# Patient Record
Sex: Male | Born: 1935 | Race: White | Hispanic: No | Marital: Single | Smoking: Never smoker
Health system: Southern US, Community
[De-identification: ages and names within clinical notes are randomized; demographics above are authoritative.]

## PROBLEM LIST (undated history)

## (undated) DIAGNOSIS — F03A0 Unspecified dementia, mild, without behavioral disturbance, psychotic disturbance, mood disturbance, and anxiety: Secondary | ICD-10-CM

## (undated) DIAGNOSIS — I10 Essential (primary) hypertension: Secondary | ICD-10-CM

## (undated) DIAGNOSIS — F0390 Unspecified dementia without behavioral disturbance: Secondary | ICD-10-CM

---

## 2005-10-03 ENCOUNTER — Emergency Department (HOSPITAL_COMMUNITY): Admission: AC | Admit: 2005-10-03 | Discharge: 2005-10-04 | Payer: Self-pay

## 2019-04-16 ENCOUNTER — Emergency Department (HOSPITAL_COMMUNITY): Payer: No Typology Code available for payment source

## 2019-04-16 ENCOUNTER — Inpatient Hospital Stay (HOSPITAL_COMMUNITY)
Admission: EM | Admit: 2019-04-16 | Discharge: 2019-07-02 | DRG: 062 | Disposition: A | Discharge status: Skilled Nursing Facility | Payer: No Typology Code available for payment source | Attending: Neurology | Admitting: Neurology

## 2019-04-16 ENCOUNTER — Inpatient Hospital Stay (HOSPITAL_COMMUNITY): Payer: No Typology Code available for payment source

## 2019-04-16 DIAGNOSIS — R531 Weakness: Secondary | ICD-10-CM | POA: Diagnosis not present

## 2019-04-16 DIAGNOSIS — R414 Neurologic neglect syndrome: Secondary | ICD-10-CM | POA: Diagnosis present

## 2019-04-16 DIAGNOSIS — G301 Alzheimer's disease with late onset: Secondary | ICD-10-CM | POA: Diagnosis not present

## 2019-04-16 DIAGNOSIS — R1310 Dysphagia, unspecified: Secondary | ICD-10-CM | POA: Diagnosis present

## 2019-04-16 DIAGNOSIS — F0390 Unspecified dementia without behavioral disturbance: Secondary | ICD-10-CM | POA: Diagnosis present

## 2019-04-16 DIAGNOSIS — I6300 Cerebral infarction due to thrombosis of unspecified precerebral artery: Secondary | ICD-10-CM | POA: Diagnosis not present

## 2019-04-16 DIAGNOSIS — R1312 Dysphagia, oropharyngeal phase: Secondary | ICD-10-CM

## 2019-04-16 DIAGNOSIS — R0682 Tachypnea, not elsewhere classified: Secondary | ICD-10-CM | POA: Diagnosis not present

## 2019-04-16 DIAGNOSIS — E785 Hyperlipidemia, unspecified: Secondary | ICD-10-CM | POA: Diagnosis present

## 2019-04-16 DIAGNOSIS — R29716 NIHSS score 16: Secondary | ICD-10-CM | POA: Diagnosis present

## 2019-04-16 DIAGNOSIS — R471 Dysarthria and anarthria: Secondary | ICD-10-CM | POA: Diagnosis present

## 2019-04-16 DIAGNOSIS — S8001XA Contusion of right knee, initial encounter: Secondary | ICD-10-CM | POA: Diagnosis present

## 2019-04-16 DIAGNOSIS — N179 Acute kidney failure, unspecified: Secondary | ICD-10-CM | POA: Diagnosis present

## 2019-04-16 DIAGNOSIS — E876 Hypokalemia: Secondary | ICD-10-CM | POA: Diagnosis not present

## 2019-04-16 DIAGNOSIS — I129 Hypertensive chronic kidney disease with stage 1 through stage 4 chronic kidney disease, or unspecified chronic kidney disease: Secondary | ICD-10-CM | POA: Diagnosis present

## 2019-04-16 DIAGNOSIS — R29810 Facial weakness: Secondary | ICD-10-CM | POA: Diagnosis present

## 2019-04-16 DIAGNOSIS — E875 Hyperkalemia: Secondary | ICD-10-CM | POA: Diagnosis not present

## 2019-04-16 DIAGNOSIS — W19XXXA Unspecified fall, initial encounter: Secondary | ICD-10-CM | POA: Diagnosis present

## 2019-04-16 DIAGNOSIS — I639 Cerebral infarction, unspecified: Secondary | ICD-10-CM | POA: Diagnosis present

## 2019-04-16 DIAGNOSIS — Z7189 Other specified counseling: Secondary | ICD-10-CM

## 2019-04-16 DIAGNOSIS — E871 Hypo-osmolality and hyponatremia: Secondary | ICD-10-CM | POA: Diagnosis not present

## 2019-04-16 DIAGNOSIS — I63522 Cerebral infarction due to unspecified occlusion or stenosis of left anterior cerebral artery: Principal | ICD-10-CM | POA: Diagnosis present

## 2019-04-16 DIAGNOSIS — F0281 Dementia in other diseases classified elsewhere with behavioral disturbance: Secondary | ICD-10-CM | POA: Diagnosis not present

## 2019-04-16 DIAGNOSIS — R4781 Slurred speech: Secondary | ICD-10-CM | POA: Diagnosis present

## 2019-04-16 DIAGNOSIS — Z751 Person awaiting admission to adequate facility elsewhere: Secondary | ICD-10-CM | POA: Diagnosis not present

## 2019-04-16 DIAGNOSIS — D72829 Elevated white blood cell count, unspecified: Secondary | ICD-10-CM

## 2019-04-16 DIAGNOSIS — Z20822 Contact with and (suspected) exposure to covid-19: Secondary | ICD-10-CM | POA: Diagnosis present

## 2019-04-16 DIAGNOSIS — K5900 Constipation, unspecified: Secondary | ICD-10-CM | POA: Diagnosis present

## 2019-04-16 DIAGNOSIS — E782 Mixed hyperlipidemia: Secondary | ICD-10-CM | POA: Diagnosis not present

## 2019-04-16 DIAGNOSIS — I361 Nonrheumatic tricuspid (valve) insufficiency: Secondary | ICD-10-CM | POA: Diagnosis not present

## 2019-04-16 DIAGNOSIS — Z608 Other problems related to social environment: Secondary | ICD-10-CM | POA: Diagnosis present

## 2019-04-16 DIAGNOSIS — E7800 Pure hypercholesterolemia, unspecified: Secondary | ICD-10-CM | POA: Diagnosis not present

## 2019-04-16 DIAGNOSIS — I10 Essential (primary) hypertension: Secondary | ICD-10-CM | POA: Diagnosis not present

## 2019-04-16 DIAGNOSIS — Z1389 Encounter for screening for other disorder: Secondary | ICD-10-CM

## 2019-04-16 DIAGNOSIS — Z609 Problem related to social environment, unspecified: Secondary | ICD-10-CM | POA: Diagnosis not present

## 2019-04-16 DIAGNOSIS — G8191 Hemiplegia, unspecified affecting right dominant side: Secondary | ICD-10-CM | POA: Diagnosis present

## 2019-04-16 DIAGNOSIS — N1831 Chronic kidney disease, stage 3a: Secondary | ICD-10-CM | POA: Diagnosis not present

## 2019-04-16 DIAGNOSIS — Z658 Other specified problems related to psychosocial circumstances: Secondary | ICD-10-CM | POA: Diagnosis not present

## 2019-04-16 DIAGNOSIS — Z0189 Encounter for other specified special examinations: Secondary | ICD-10-CM

## 2019-04-16 DIAGNOSIS — K117 Disturbances of salivary secretion: Secondary | ICD-10-CM

## 2019-04-16 DIAGNOSIS — I63422 Cerebral infarction due to embolism of left anterior cerebral artery: Secondary | ICD-10-CM | POA: Diagnosis not present

## 2019-04-16 DIAGNOSIS — M6281 Muscle weakness (generalized): Secondary | ICD-10-CM | POA: Diagnosis present

## 2019-04-16 DIAGNOSIS — R4701 Aphasia: Secondary | ICD-10-CM

## 2019-04-16 DIAGNOSIS — R000 Tachycardia, unspecified: Secondary | ICD-10-CM | POA: Diagnosis not present

## 2019-04-16 DIAGNOSIS — R509 Fever, unspecified: Secondary | ICD-10-CM

## 2019-04-16 DIAGNOSIS — Z515 Encounter for palliative care: Secondary | ICD-10-CM

## 2019-04-16 DIAGNOSIS — I340 Nonrheumatic mitral (valve) insufficiency: Secondary | ICD-10-CM | POA: Diagnosis not present

## 2019-04-16 DIAGNOSIS — R479 Unspecified speech disturbances: Secondary | ICD-10-CM | POA: Diagnosis not present

## 2019-04-16 DIAGNOSIS — F0280 Dementia in other diseases classified elsewhere without behavioral disturbance: Secondary | ICD-10-CM | POA: Diagnosis not present

## 2019-04-16 HISTORY — DX: Unspecified dementia, mild, without behavioral disturbance, psychotic disturbance, mood disturbance, and anxiety: F03.A0

## 2019-04-16 HISTORY — DX: Essential (primary) hypertension: I10

## 2019-04-16 HISTORY — DX: Unspecified dementia without behavioral disturbance: F03.90

## 2019-04-16 LAB — COMPREHENSIVE METABOLIC PANEL
ALT: 25 U/L (ref 0–44)
AST: 29 U/L (ref 15–41)
Albumin: 3.8 g/dL (ref 3.5–5.0)
Alkaline Phosphatase: 100 U/L (ref 38–126)
Anion gap: 6 (ref 5–15)
BUN: 24 mg/dL — ABNORMAL HIGH (ref 8–23)
CO2: 26 mmol/L (ref 22–32)
Calcium: 9.1 mg/dL (ref 8.9–10.3)
Chloride: 107 mmol/L (ref 98–111)
Creatinine, Ser: 1.37 mg/dL — ABNORMAL HIGH (ref 0.61–1.24)
GFR calc Af Amer: 53 mL/min — ABNORMAL LOW (ref >60)
GFR calc non Af Amer: 46 mL/min — ABNORMAL LOW (ref >60)
Glucose, Bld: 133 mg/dL — ABNORMAL HIGH (ref 70–99)
Potassium: 4.1 mmol/L (ref 3.5–5.1)
Sodium: 139 mmol/L (ref 135–145)
Total Bilirubin: 0.5 mg/dL (ref 0.3–1.2)
Total Protein: 6.9 g/dL (ref 6.5–8.1)

## 2019-04-16 LAB — I-STAT CHEM 8, ED
BUN: 25 mg/dL — ABNORMAL HIGH (ref 8–23)
Calcium, Ion: 1.11 mmol/L — ABNORMAL LOW (ref 1.15–1.40)
Chloride: 106 mmol/L (ref 98–111)
Creatinine, Ser: 1.5 mg/dL — ABNORMAL HIGH (ref 0.61–1.24)
Glucose, Bld: 132 mg/dL — ABNORMAL HIGH (ref 70–99)
HCT: 40 % (ref 39.0–52.0)
Hemoglobin: 13.6 g/dL (ref 13.0–17.0)
Potassium: 4.1 mmol/L (ref 3.5–5.1)
Sodium: 139 mmol/L (ref 135–145)
TCO2: 24 mmol/L (ref 22–32)

## 2019-04-16 LAB — CBC
HCT: 40.9 % (ref 39.0–52.0)
Hemoglobin: 14 g/dL (ref 13.0–17.0)
MCH: 32 pg (ref 26.0–34.0)
MCHC: 34.2 g/dL (ref 30.0–36.0)
MCV: 93.4 fL (ref 80.0–100.0)
Platelets: 196 10*3/uL (ref 150–400)
RBC: 4.38 MIL/uL (ref 4.22–5.81)
RDW: 11.7 % (ref 11.5–15.5)
WBC: 7.2 10*3/uL (ref 4.0–10.5)
nRBC: 0 % (ref 0.0–0.2)

## 2019-04-16 LAB — DIFFERENTIAL
Abs Immature Granulocytes: 0.02 10*3/uL (ref 0.00–0.07)
Basophils Absolute: 0.1 10*3/uL (ref 0.0–0.1)
Basophils Relative: 1 %
Eosinophils Absolute: 0.2 10*3/uL (ref 0.0–0.5)
Eosinophils Relative: 3 %
Immature Granulocytes: 0 %
Lymphocytes Relative: 18 %
Lymphs Abs: 1.3 10*3/uL (ref 0.7–4.0)
Monocytes Absolute: 0.5 10*3/uL (ref 0.1–1.0)
Monocytes Relative: 7 %
Neutro Abs: 5.1 10*3/uL (ref 1.7–7.7)
Neutrophils Relative %: 71 %

## 2019-04-16 LAB — PROTIME-INR
INR: 1.1 (ref 0.8–1.2)
Prothrombin Time: 13.7 seconds (ref 11.4–15.2)

## 2019-04-16 LAB — APTT: aPTT: 28 seconds (ref 24–36)

## 2019-04-16 LAB — ETHANOL: Alcohol, Ethyl (B): <10 mg/dL (ref <10)

## 2019-04-16 LAB — CBG MONITORING, ED: Glucose-Capillary: 145 mg/dL — ABNORMAL HIGH (ref 70–99)

## 2019-04-16 MED ORDER — PANTOPRAZOLE SODIUM 40 MG IV SOLR: 40.0000 mg | Freq: Every day | INTRAVENOUS | Status: DC

## 2019-04-16 MED ORDER — ALTEPLASE (STROKE) FULL DOSE INFUSION
0.9000 mg/kg | Freq: Once | INTRAVENOUS | Status: AC
  Administered 2019-04-16: 22:00:00 58.5 mg via INTRAVENOUS
  Filled 2019-04-16: qty 100

## 2019-04-16 MED ORDER — ACETAMINOPHEN 650 MG RE SUPP: 650.0000 mg | RECTAL | Status: DC | PRN

## 2019-04-16 MED ORDER — STROKE: EARLY STAGES OF RECOVERY BOOK
Freq: Once | Status: DC
  Filled 2019-04-16: qty 1

## 2019-04-16 MED ORDER — SENNOSIDES-DOCUSATE SODIUM 8.6-50 MG PO TABS: 1.0000 | ORAL_TABLET | Freq: Every evening | ORAL | Status: DC | PRN

## 2019-04-16 MED ORDER — SODIUM CHLORIDE 0.9 % IV SOLN
50.0000 mL | Freq: Once | INTRAVENOUS | Status: AC
  Administered 2019-04-17: 50 mL via INTRAVENOUS

## 2019-04-16 MED ORDER — IOHEXOL 350 MG/ML SOLN
40.0000 mL | Freq: Once | INTRAVENOUS | Status: AC | PRN
  Administered 2019-04-16: 40 mL via INTRAVENOUS

## 2019-04-16 MED ORDER — IOHEXOL 350 MG/ML SOLN
100.0000 mL | Freq: Once | INTRAVENOUS | Status: AC | PRN
  Administered 2019-04-16: 100 mL via INTRAVENOUS

## 2019-04-16 MED ORDER — ACETAMINOPHEN 325 MG PO TABS
650.0000 mg | ORAL_TABLET | ORAL | Status: DC | PRN
  Administered 2019-04-23 – 2019-06-16 (×4): 650 mg via ORAL
  Filled 2019-04-16 (×4): qty 2

## 2019-04-16 MED ORDER — SODIUM CHLORIDE 0.9 % IV SOLN
INTRAVENOUS | Status: DC
  Administered 2019-04-17: 03:00:00 1000 mL via INTRAVENOUS

## 2019-04-16 MED ORDER — ACETAMINOPHEN 160 MG/5ML PO SOLN
650.0000 mg | ORAL | Status: DC | PRN
  Administered 2019-04-17: 16:00:00 650 mg
  Filled 2019-04-16: qty 20.3

## 2019-04-16 NOTE — ED Triage Notes (Signed)
Pt arrives via [facility] from home.   Code stroke called for facial droop, weakness and dysarthria.  Pt [id number] Pt not on any blood thinners per daughter, no hx of stroke.

## 2019-04-16 NOTE — ED Notes (Signed)
ICU [place] daughter [phone] looking for an update

## 2019-04-16 NOTE — ED Provider Notes (Signed)
[facility] EMERGENCY DEPARTMENT Provider Note   CSN: [id number] Arrival date & time: 04/16/19  2155     History No chief complaint on file.   Scott Gilbert is a 86 y.o. male.  Patient presented to the ED by EMS from home as code stroke exhibiting difficulty speaking and right sided weakness. Last seen normal was around 9:00 pm tonight. The patient cannot contribute to history.   The history is provided by the EMS personnel. No language interpreter was used.       No past medical history on file.  There are no problems to display for this patient.      No family history on file.  Social History   Tobacco Use  . Smoking status: Not on file  Substance Use Topics  . Alcohol use: Not on file  . Drug use: Not on file    Home Medications Prior to Admission medications   Not on File    Allergies    Patient has no allergy information on record.  Review of Systems   Review of Systems  Unable to perform ROS: Acuity of condition  Neurological: Positive for speech difficulty and weakness (Right sided weakness).    Physical Exam Updated Vital Signs Wt 65 kg   Physical Exam Vitals and nursing note reviewed.  Constitutional:      Comments: Awake, aphasic  HENT:     Head: Atraumatic.  Cardiovascular:     Rate and Rhythm: Normal rate and regular rhythm.  Pulmonary:     Effort: Pulmonary effort is normal.     Breath sounds: No wheezing, rhonchi or rales.  Chest:     Chest wall: No tenderness.  Musculoskeletal:     Cervical back: Normal range of motion.     Right lower leg: No edema.     Left lower leg: No edema.  Skin:    General: Skin is warm and dry.  Neurological:     Mental Status: He is alert.     Comments: Full expressive aphasia. Right sided neglect. No movement right extremities, full movement left extremities. Unable to follow command.     ED Results / Procedures / Treatments   Labs (all labs ordered are listed, but only  abnormal results are displayed) Labs Reviewed  I-STAT CHEM 8, ED - Abnormal; Notable for the following components:      Result Value   BUN 25 (*)    Creatinine, Ser 1.50 (*)    Glucose, Bld 132 (*)    Calcium, Ion 1.11 (*)    All other components within normal limits  CBG MONITORING, ED - Abnormal; Notable for the following components:   Glucose-Capillary 145 (*)    All other components within normal limits  ETHANOL  PROTIME-INR  APTT  CBC  DIFFERENTIAL  COMPREHENSIVE METABOLIC PANEL  RAPID URINE DRUG SCREEN, HOSP PERFORMED  URINALYSIS, ROUTINE W REFLEX MICROSCOPIC    EKG None  Radiology CT HEAD CODE STROKE WO CONTRAST  Result Date: 04/16/2019 CLINICAL DATA:  Code stroke. Initial evaluation for acute facial droop, slurred speech. EXAM: CT HEAD WITHOUT CONTRAST TECHNIQUE: Contiguous axial images were obtained from the base of the skull through the vertex without intravenous contrast. COMPARISON:  Prior head CT from 10/04/2005 FINDINGS: Brain: Advanced age-related cerebral atrophy. Confluent hypodensity within the periventricular deep white matter both cerebral hemispheres most consistent with chronic small vessel ischemic disease, moderate to advanced in nature. Multiple scattered superimposed remote lacunar infarct present within the bilateral basal  ganglia. Chronic left cerebellar infarcts noted. No acute intracranial hemorrhage. No acute large vessel territory infarct. No mass lesion or midline shift. Diffuse ventricular prominence related to global parenchymal volume loss without hydrocephalus. No extra-axial fluid collection. Vascular: No hyperdense vessel. Skull: Scalp soft tissues and calvarium within normal limits. Sinuses/Orbits: Globes and orbital soft tissues normal. Paranasal sinuses and mastoid air cells are clear. Other: None. ASPECTS [facility] Stroke Program Early CT Score) - Ganglionic level infarction (caudate, lentiform nuclei, internal capsule, insula, M1-M3 cortex): 7 -  Supraganglionic infarction (M4-M6 cortex): 3 Total score (0-10 with 10 being normal): 10 IMPRESSION: 1. No acute intracranial abnormality 2. ASPECTS is 10. 3. Advanced age-related cerebral atrophy with chronic small vessel ischemic disease, with multiple remote lacunar infarcts involving the bilateral basal ganglia, with chronic left cerebellar infarcts. These results were communicated to Dr. Lorraine Lax at 10:13 pmon 1/29/2021by text page via the [facility] messaging system. Electronically Signed   By: Jeannine Boga M.D.   On: 04/16/2019 22:15    Procedures Procedures (including critical care time)  Medications Ordered in ED Medications  alteplase (ACTIVASE) 1 mg/mL infusion 58.5 mg (has no administration in time range)    Followed by  0.9 %  sodium chloride infusion (has no administration in time range)  iohexol (OMNIPAQUE) 350 MG/ML injection 100 mL (100 mLs Intravenous Contrast Given 04/16/19 2221)    ED Course  I have reviewed the triage vital signs and the nursing notes.  Pertinent labs & imaging results that were available during my care of the patient were reviewed by me and considered in my medical decision making (see chart for details).    MDM Rules/Calculators/A&P                      The patient is met on arrival by stroke team, neurologist Dr. Lorraine Lax and taken directly to CT as a code stroke.   Patient care assumed by Dr. Lorraine Lax.   Final Clinical Impression(s) / ED Diagnoses Final diagnoses:  None   1. Code stroke  Rx / DC Orders ED Discharge Orders    None       Charlann Lange, PA-C 04/18/19 [id number]    Tegeler, Gwenyth Allegra, MD 04/20/19 [phone]

## 2019-04-16 NOTE — Progress Notes (Signed)
PHARMACIST CODE STROKE RESPONSE  Notified to mix tPA at 2204 by Dr. Laurence Slate Delivered tPA to RN at 2208  tPA dose = 5.9mg  bolus over 1 minute followed by 52.6mg  for a total dose of 58.5mg  over 1 hour  Issues/delays encountered (if applicable): Extra IV blew and only had 1 working IV. Wanted to obtain CTA prior to starting TPA as concern for LVO.   Joaquim Lai PharmD. BCPS  04/16/19 10:41 PM

## 2019-04-16 NOTE — Code Documentation (Signed)
Responded to Code Stroke called at 2135 for R facial droop, weakness, and slurred speech, [id number]. Pt was having dinner with family when he had a sudden onset R sided weakness and difficulty speaking. Pt arrived to ED at 2155, NIH-16, CBG-145. CT head-negative. While in CT, pt had mild improvement in symptoms, NIH-14.  CTA done-no LVO. SBP-202/90, 20mg  labetolol given at 2222 with resulting SBP-152/87. TPA started at 2225. Plan to admit to Neuro ICU.

## 2019-04-17 ENCOUNTER — Inpatient Hospital Stay (HOSPITAL_COMMUNITY): Payer: No Typology Code available for payment source

## 2019-04-17 DIAGNOSIS — I340 Nonrheumatic mitral (valve) insufficiency: Secondary | ICD-10-CM

## 2019-04-17 DIAGNOSIS — E782 Mixed hyperlipidemia: Secondary | ICD-10-CM

## 2019-04-17 DIAGNOSIS — R531 Weakness: Secondary | ICD-10-CM

## 2019-04-17 DIAGNOSIS — R479 Unspecified speech disturbances: Secondary | ICD-10-CM

## 2019-04-17 DIAGNOSIS — I361 Nonrheumatic tricuspid (valve) insufficiency: Secondary | ICD-10-CM

## 2019-04-17 DIAGNOSIS — N1831 Chronic kidney disease, stage 3a: Secondary | ICD-10-CM

## 2019-04-17 DIAGNOSIS — I63422 Cerebral infarction due to embolism of left anterior cerebral artery: Secondary | ICD-10-CM

## 2019-04-17 DIAGNOSIS — F0281 Dementia in other diseases classified elsewhere with behavioral disturbance: Secondary | ICD-10-CM

## 2019-04-17 DIAGNOSIS — G301 Alzheimer's disease with late onset: Secondary | ICD-10-CM

## 2019-04-17 LAB — RAPID URINE DRUG SCREEN, HOSP PERFORMED
Amphetamines: NOT DETECTED
Barbiturates: NOT DETECTED
Benzodiazepines: NOT DETECTED
Cocaine: NOT DETECTED
Opiates: NOT DETECTED
Tetrahydrocannabinol: NOT DETECTED

## 2019-04-17 LAB — ECHOCARDIOGRAM COMPLETE: Weight: 2197.55 oz

## 2019-04-17 LAB — URINALYSIS, ROUTINE W REFLEX MICROSCOPIC
Bacteria, UA: NONE SEEN
Bilirubin Urine: NEGATIVE
Glucose, UA: NEGATIVE mg/dL
Ketones, ur: NEGATIVE mg/dL
Leukocytes,Ua: NEGATIVE
Nitrite: NEGATIVE
Protein, ur: NEGATIVE mg/dL
Specific Gravity, Urine: >1.046 — ABNORMAL HIGH (ref 1.005–1.030)
pH: 5 (ref 5.0–8.0)

## 2019-04-17 LAB — LIPID PANEL
Cholesterol: 167 mg/dL (ref 0–200)
HDL: 38 mg/dL — ABNORMAL LOW (ref >40)
LDL Cholesterol: 113 mg/dL — ABNORMAL HIGH (ref 0–99)
Total CHOL/HDL Ratio: 4.4 RATIO
Triglycerides: 82 mg/dL (ref <150)
VLDL: 16 mg/dL (ref 0–40)

## 2019-04-17 LAB — HEMOGLOBIN A1C
Hgb A1c MFr Bld: 5.3 % (ref 4.8–5.6)
Mean Plasma Glucose: 105.41 mg/dL

## 2019-04-17 LAB — RESPIRATORY PANEL BY RT PCR (FLU A&B, COVID)
Influenza A by PCR: NEGATIVE
Influenza B by PCR: NEGATIVE
SARS Coronavirus 2 by RT PCR: NEGATIVE

## 2019-04-17 LAB — MRSA PCR SCREENING: MRSA by PCR: NEGATIVE

## 2019-04-17 MED ORDER — ACYCLOVIR 200 MG/5ML PO SUSP
400.0000 mg | Freq: Two times a day (BID) | ORAL | Status: DC
  Administered 2019-04-17 (×2): 400 mg via ORAL
  Filled 2019-04-17 (×3): qty 10

## 2019-04-17 MED ORDER — RISPERIDONE 0.5 MG PO TABS
0.5000 mg | ORAL_TABLET | Freq: Every day | ORAL | Status: DC
  Administered 2019-04-17 – 2019-07-02 (×77): 0.5 mg via ORAL
  Filled 2019-04-17 (×78): qty 1

## 2019-04-17 MED ORDER — LABETALOL HCL 5 MG/ML IV SOLN
20.0000 mg | Freq: Once | INTRAVENOUS | Status: AC
  Administered 2019-04-16: 20 mg via INTRAVENOUS

## 2019-04-17 MED ORDER — GALANTAMINE HYDROBROMIDE 4 MG PO TABS
8.0000 mg | ORAL_TABLET | Freq: Every day | ORAL | Status: DC
  Administered 2019-04-17 – 2019-06-10 (×55): 8 mg via ORAL
  Filled 2019-04-17 (×58): qty 2

## 2019-04-17 MED ORDER — ADULT MULTIVITAMIN W/MINERALS CH
1.0000 | ORAL_TABLET | Freq: Every day | ORAL | Status: DC
  Administered 2019-04-17 – 2019-07-02 (×77): 1 via ORAL
  Filled 2019-04-17 (×77): qty 1

## 2019-04-17 MED ORDER — ORAL CARE MOUTH RINSE
15.0000 mL | Freq: Two times a day (BID) | OROMUCOSAL | Status: DC
  Administered 2019-04-17 – 2019-07-02 (×149): 15 mL via OROMUCOSAL

## 2019-04-17 MED ORDER — ATORVASTATIN CALCIUM 40 MG PO TABS
40.0000 mg | ORAL_TABLET | Freq: Every day | ORAL | Status: DC
  Administered 2019-04-17 – 2019-07-02 (×76): 40 mg via ORAL
  Filled 2019-04-17 (×71): qty 1
  Filled 2019-04-17: qty 4
  Filled 2019-04-17 (×5): qty 1

## 2019-04-17 MED ORDER — PANTOPRAZOLE SODIUM 40 MG PO PACK: 40.0000 mg | PACK | Freq: Every day | ORAL | Status: DC

## 2019-04-17 MED ORDER — PANTOPRAZOLE SODIUM 40 MG PO PACK
40.0000 mg | PACK | Freq: Every day | ORAL | Status: DC
  Administered 2019-04-17 – 2019-07-02 (×76): 40 mg via ORAL
  Filled 2019-04-17 (×76): qty 20

## 2019-04-17 MED ORDER — CHLORHEXIDINE GLUCONATE CLOTH 2 % EX PADS: 6.0000 | MEDICATED_PAD | CUTANEOUS | Status: AC

## 2019-04-17 MED ORDER — ACYCLOVIR 200 MG PO CAPS
400.0000 mg | ORAL_CAPSULE | Freq: Two times a day (BID) | ORAL | Status: DC
  Filled 2019-04-17 (×2): qty 2

## 2019-04-17 MED ORDER — POLYVINYL ALCOHOL 1.4 % OP SOLN
1.0000 [drp] | Freq: Four times a day (QID) | OPHTHALMIC | Status: DC | PRN
  Administered 2019-04-24 – 2019-05-29 (×13): 1 [drp] via OPHTHALMIC
  Filled 2019-04-17: qty 15

## 2019-04-17 MED ORDER — LABETALOL HCL 5 MG/ML IV SOLN
5.0000 mg | INTRAVENOUS | Status: DC | PRN
  Administered 2019-05-06: 10 mg via INTRAVENOUS
  Filled 2019-04-17: qty 4

## 2019-04-17 MED ORDER — PANTOPRAZOLE SODIUM 40 MG PO TBEC: 40.0000 mg | DELAYED_RELEASE_TABLET | Freq: Every day | ORAL | Status: DC

## 2019-04-17 NOTE — Evaluation (Signed)
Speech Language Pathology Evaluation Patient Details Name: Scott Gilbert MRN: [id number] DOB: 1929-04-24 Today's Date: 04/17/2019 Time: 4098-1191 SLP Time Calculation (min) (ACUTE ONLY): 13 min  Problem List:  Patient Active Problem List   Diagnosis Date Noted  . 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  HPI:  Scott Gilbert is a 86 y.o. male with past medical history significant for hypertension, mild dementia presented to the emergency department with sudden onset of slurred speech and aphasia, facial droop and weakness.   CT cerebral perfusion on 04/16/19 reported: "acute distal Left ACA territory ischemia and infarct."    Assessment / Plan / Recommendation Clinical Impression  Pt was seen for a cognitive-linguistic evaluation in the setting of an acute distal left ACA territory infarct and ischemia.  Pt presents with global aphasia c/b deficits in expressive and receptive language.  Pt was alert and pleasant throughout this evaluation; however, very limited vocalizations were observed.  Pt occasionally laughed, but no verbalizations were observed and he was unable to imitate open vowel sounds or short words despite verbal and written cues.  Pt followed basic 1 step commands with 2/8 accuracy given verbal repetition and a visual model.  He was unable to answer basic biographical yes/no questions via verbalization, communication board, head nod, or hand squeeze.  He additionally was unable to complete confrontational naming tasks despite max semantic and phonemic cues.  Pt was observed to have a L gaze preference, but he was able to track SLP well on both the left and right sides of the room.  He exhibited good eye contact and sustained attention to the SLP throughout this evaluation.  No additional cognitive assessment was completed secondary to aphasia.  Recommend additional Speech Therapy at next level of care.  SLP will f/u acutely for  aphasia treatment per POC.      SLP Assessment  SLP Recommendation/Assessment: Patient needs continued Speech Lanaguage Pathology Services SLP Visit Diagnosis: Aphasia (R47.01)    Follow Up Recommendations  Skilled Nursing facility    Frequency and Duration min 2x/week  2 weeks      SLP Evaluation Cognition  Overall Cognitive Status: Impaired/Different from baseline Arousal/Alertness: Awake/alert Orientation Level: (Unable to evaluate secondary to aphasia ) Comments: Unable to evaluate cognition secondary to aphasia        Comprehension  Auditory Comprehension Overall Auditory Comprehension: Impaired Yes/No Questions: Impaired Basic Biographical Questions: 0-25% accurate Commands: Impaired One Step Basic Commands: 25-49% accurate Conversation: Simple EffectiveTechniques: Repetition;Slowed speech;Visual/Gestural cues;Stressing words    Expression Expression Primary Mode of Expression: Verbal Verbal Expression Overall Verbal Expression: Impaired Initiation: Impaired Repetition: Impaired Level of Impairment: Word level Naming: Impairment Responsive: Not tested Confrontation: Impaired Convergent: 0-24% accurate Divergent: Not tested   Oral / Motor  Oral Motor/Sensory Function Overall Oral Motor/Sensory Function: Moderate impairment Facial ROM: Reduced right Facial Symmetry: Abnormal symmetry right Lingual Symmetry: Abnormal symmetry right Motor Speech Overall Motor Speech: (Unable to evaluate secondary to aphasia )                      Scott Herb., M.S., CCC-SLP Acute Rehabilitation Services Office: [phone]  Scott Gilbert Scott Gilbert 04/17/2019, 9:10 AM

## 2019-04-17 NOTE — H&P (Signed)
Chief Complaint: Aphasia, right-sided weakness and slurred speech  History obtained from: Patient's daughter and Chart    HPI:                                                                                                                                       Terrel Nesheiwat is a 86 y.o. male with past medical history significant for hypertension, mild dementia presents to the emergency department with sudden onset of slurred speech and aphasia, facial droop and weakness.  According to the patient's daughter, patient was his normal self when around 41 PM family witnessed patient suddenly having slurred speech, however facial droop and weak.  EMS was called their assessment patient having left gaze preference with right-sided neglect, right hemiparesis and aphasia.  Blood pressure was 140 systolic.  Arrived to [facility], ED as a code stroke.  On arrival, patient was taken medially to CT scan.  Stat CT head was obtained which showed significant atrophy but no acute ischemic changes/hemorrhage.  Patient was administered IV TPA after discussing risk versus benefit with daughter.  CT angiogram was obtained which showed no large vessel occlusion however so severe intracranial atherosclerotic disease.  As patient continued to have significant deficits despite IV TPA administration, CT perfusion was performed to assess if there was a thrombus that could have been missed on CTA.  CT perfusion demonstrated core/penumbra in the ACA territory with possible occlusion of the A2 segment, not a candidate for thrombectomy due to distal site of occlusion  Past medical records are not available as patient seeks medical care at the [place].  Date last known well: 04/16/19 Time last known well: 9 PM tPA Given: Yes NIHSS: 16 Baseline MRS 1  No past medical history on file. Hypertension Mild dementia   No family history on file. Social History:  has no history on file for tobacco, alcohol, and  drug.  Allergies: No Known Allergies  Medications:                                                                                                                        I reviewed home medications   ROS:  Unable to obtain due to patient's mental status   Examination:                                                                                                      General: Appears well-developed  Psych: Unable to assess due to patient mental status Eyes: No scleral injection HENT: No OP obstrucion Head: Normocephalic.  Cardiovascular: Normal rate and regular rhythm.  Respiratory: Effort normal and breath sounds normal to anterior ascultation GI: Soft.  No distension. There is no tenderness.  Skin: WDI    Neurological Examination Mental Status: Alert, does not answer any questions or follow any commands.  Globally aphasic.  Control and instrumentation engineer. Cranial Nerves: II: Visual fields grossly normal (initially felt to have right-sided neglect but later improved) III,IV, VI: ptosis not present, extra-ocular motions intact bilaterally, pupils equal, round, reactive to light and accommodation V,VII: Right facial droop, unable to assess facial sensation due to mental status VIII: Unable to assess due to mental status IX,X: uvula rises symmetrically XII: midline tongue extension Motor: Right : Upper extremity   4/5    Left:     Upper extremity   5/5  Lower extremity   4/5     Lower extremity   5/5 Tone and bulk:normal tone throughout; no atrophy noted Sensory: Withdraws to noxious stimulus bilaterally Deep Tendon Reflexes: 2+ and symmetric throughout Plantars: Right: downgoing   Left: downgoing Cerebellar: No gross ataxia out of proportion to weakness Gait: Unable to assess due to safety   Lab Results: Basic Metabolic Panel: Recent Labs  Lab  04/16/19 07-12-13 04/16/19 2217/07/12  NA 139 139  K 4.1 4.1  CL 106 107  CO2  --  26  GLUCOSE 132* 133*  BUN 25* 24*  CREATININE 1.50* 1.37*  CALCIUM  --  9.1    CBC: Recent Labs  Lab 04/16/19 2215 04/16/19 07/12/2217  WBC  --  7.2  NEUTROABS  --  5.1  HGB 13.6 14.0  HCT 40.0 40.9  MCV  --  93.4  PLT  --  196    Coagulation Studies: Recent Labs    04/16/19 07-12-2217  LABPROT 13.7  INR 1.1    Imaging: CT ANGIO HEAD W OR WO CONTRAST  Result Date: 04/16/2019 CLINICAL DATA:  86 year old male code stroke presentation. Slurred speech, facial droop. EXAM: CT ANGIOGRAPHY HEAD AND NECK TECHNIQUE: Multidetector CT imaging of the head and neck was performed using the standard protocol during bolus administration of intravenous contrast. Multiplanar CT image reconstructions and MIPs were obtained to evaluate the vascular anatomy. Carotid stenosis measurements (when applicable) are obtained utilizing NASCET criteria, using the distal internal carotid diameter as the denominator. CONTRAST:  OMNIPAQUE IOHEXOL 350 MG/ML SOLN COMPARISON:  Head CT without contrast 2202-07-13 hours today. FINDINGS: CTA NECK Skeleton: Absent dentition. Cervical spine degeneration. No acute osseous abnormality identified. Upper chest: Calcified pleural plaques and scattered granulomas in the upper lungs. Densely calcified small mediastinal lymph nodes. No superior mediastinal lymphadenopathy. Other neck: Negative, no acute findings. Aortic arch: Moderate arch atherosclerosis. Three vessel arch configuration. Right carotid  system: Brachiocephalic artery plaque without stenosis. Mildly tortuous right CCA. Only mild plaque at the right carotid bifurcation. Tortuous cervical right ICA without stenosis. Left carotid system: Left CCA origin plaque without stenosis. Mild soft and calcified plaque proximal to the bifurcation. Mild calcified plaque at the bifurcation and left ICA bulb without stenosis. Vertebral arteries: Calcified plaque in the  proximal right subclavian artery plus a kinked appearance of the vessel due to tortuosity in the superior mediastinum. Calcified plaque adjacent to the right vertebral artery origin not resulting in stenosis. Patent right vertebral artery to the skull base without stenosis. Soft and calcified plaque in the proximal left subclavian artery without stenosis. Soft plaque at the left vertebral artery origin without stenosis. Patent left vertebral artery to the skull base without stenosis. CTA HEAD Posterior circulation: Both distal vertebral arteries are patent but diminutive. There is V4 calcified plaque greater on the right, and moderate to severe stenosis of the right vertebral artery just distal to the patent PICA origin. The left vertebral is also diminutive beyond the left PICA, and there is moderate to severe left vertebrobasilar junction stenosis. Patent basilar artery with irregularity but no stenosis. Patent SCA origins. There is a fetal type right PCA origin. The left P1 segment demonstrates multifocal moderate to severe stenosis (series 11, image 22). Preserved distal left PCA enhancement, the P3 vessels are irregular. Contralateral mild to moderate right P2 and up to severe right P3 branch stenoses on series 12, image 18. Anterior circulation: Both ICA siphons are patent but heavily calcified. On the left there is moderate to severe supraclinoid stenosis on series 7, image 107. On the right there is mild to moderate cavernous segment stenosis. Normal right posterior communicating artery origin. Patent carotid termini, MCA and ACA origins. Mildly irregular ACA A1 segments. Normal anterior communicating artery. There is a severe stenosis of the distal left ACA A2 with preserved enhancement on series 12, image 19. Best seen on series 11 image 18 there is irregularity and stenosis of the left MCA mid M1. Similar but less pronounced irregularity at the left bifurcation and multiple M2 branches. This more  resembles intracranial atherosclerosis than emergent large vessel occlusion. Elsewhere there is a severe left MCA M3 stenosis on series 12, image 28 with preserved distal enhancement. Contralateral right MCA M1 segment is patent with less irregularity. But there is similar moderate irregularity and stenosis at the right MCA bifurcation. And a severe posterior right M3 stenosis on series 12, image 12. Venous sinuses: Patent. Anatomic variants: Mildly dominant right vertebral artery. Review of the MIP images confirms the above findings IMPRESSION: 1. Negative for emergent large vessel occlusion, but positive for widespread severe intracranial atherosclerosis. 2. Multifocal large and medium size vessel stenoses in the anterior and posterior circulation, most notable for: - severe distal Left ICA siphon stenosis due to bulky calcified plaque. - multifocal Left MCA irregularity and stenoses. - up to severe stenoses of both distal Vertebral Arteries, and the Left PCA. - bilateral MCA M3, and Left ACA A2 severe stenoses. 3. But relatively little atherosclerosis in the neck with no significant cervical vertebral or carotid stenosis. 4.  Aortic Atherosclerosis (ICD10-I70.0). 5. Post granulomatous changes in the upper chest including calcified pleural plaques, pulmonary granulomas and mediastinal lymph nodes. Salient findings discussed by telephone with Dr. Arther Dames on 04/16/2019 initially at 22:35 and again at 2242 hours. Electronically Signed   By: Odessa Fleming M.D.   On: 04/16/2019 22:50   CT ANGIO NECK W OR WO  CONTRAST  Result Date: 04/16/2019 CLINICAL DATA:  86 year old male code stroke presentation. Slurred speech, facial droop. EXAM: CT ANGIOGRAPHY HEAD AND NECK TECHNIQUE: Multidetector CT imaging of the head and neck was performed using the standard protocol during bolus administration of intravenous contrast. Multiplanar CT image reconstructions and MIPs were obtained to evaluate the vascular anatomy. Carotid  stenosis measurements (when applicable) are obtained utilizing NASCET criteria, using the distal internal carotid diameter as the denominator. CONTRAST:  OMNIPAQUE IOHEXOL 350 MG/ML SOLN COMPARISON:  Head CT without contrast 2204 hours today. FINDINGS: CTA NECK Skeleton: Absent dentition. Cervical spine degeneration. No acute osseous abnormality identified. Upper chest: Calcified pleural plaques and scattered granulomas in the upper lungs. Densely calcified small mediastinal lymph nodes. No superior mediastinal lymphadenopathy. Other neck: Negative, no acute findings. Aortic arch: Moderate arch atherosclerosis. Three vessel arch configuration. Right carotid system: Brachiocephalic artery plaque without stenosis. Mildly tortuous right CCA. Only mild plaque at the right carotid bifurcation. Tortuous cervical right ICA without stenosis. Left carotid system: Left CCA origin plaque without stenosis. Mild soft and calcified plaque proximal to the bifurcation. Mild calcified plaque at the bifurcation and left ICA bulb without stenosis. Vertebral arteries: Calcified plaque in the proximal right subclavian artery plus a kinked appearance of the vessel due to tortuosity in the superior mediastinum. Calcified plaque adjacent to the right vertebral artery origin not resulting in stenosis. Patent right vertebral artery to the skull base without stenosis. Soft and calcified plaque in the proximal left subclavian artery without stenosis. Soft plaque at the left vertebral artery origin without stenosis. Patent left vertebral artery to the skull base without stenosis. CTA HEAD Posterior circulation: Both distal vertebral arteries are patent but diminutive. There is V4 calcified plaque greater on the right, and moderate to severe stenosis of the right vertebral artery just distal to the patent PICA origin. The left vertebral is also diminutive beyond the left PICA, and there is moderate to severe left vertebrobasilar junction  stenosis. Patent basilar artery with irregularity but no stenosis. Patent SCA origins. There is a fetal type right PCA origin. The left P1 segment demonstrates multifocal moderate to severe stenosis (series 11, image 22). Preserved distal left PCA enhancement, the P3 vessels are irregular. Contralateral mild to moderate right P2 and up to severe right P3 branch stenoses on series 12, image 18. Anterior circulation: Both ICA siphons are patent but heavily calcified. On the left there is moderate to severe supraclinoid stenosis on series 7, image 107. On the right there is mild to moderate cavernous segment stenosis. Normal right posterior communicating artery origin. Patent carotid termini, MCA and ACA origins. Mildly irregular ACA A1 segments. Normal anterior communicating artery. There is a severe stenosis of the distal left ACA A2 with preserved enhancement on series 12, image 19. Best seen on series 11 image 18 there is irregularity and stenosis of the left MCA mid M1. Similar but less pronounced irregularity at the left bifurcation and multiple M2 branches. This more resembles intracranial atherosclerosis than emergent large vessel occlusion. Elsewhere there is a severe left MCA M3 stenosis on series 12, image 28 with preserved distal enhancement. Contralateral right MCA M1 segment is patent with less irregularity. But there is similar moderate irregularity and stenosis at the right MCA bifurcation. And a severe posterior right M3 stenosis on series 12, image 12. Venous sinuses: Patent. Anatomic variants: Mildly dominant right vertebral artery. Review of the MIP images confirms the above findings IMPRESSION: 1. Negative for emergent large vessel occlusion, but  positive for widespread severe intracranial atherosclerosis. 2. Multifocal large and medium size vessel stenoses in the anterior and posterior circulation, most notable for: - severe distal Left ICA siphon stenosis due to bulky calcified plaque. -  multifocal Left MCA irregularity and stenoses. - up to severe stenoses of both distal Vertebral Arteries, and the Left PCA. - bilateral MCA M3, and Left ACA A2 severe stenoses. 3. But relatively little atherosclerosis in the neck with no significant cervical vertebral or carotid stenosis. 4.  Aortic Atherosclerosis (ICD10-I70.0). 5. Post granulomatous changes in the upper chest including calcified pleural plaques, pulmonary granulomas and mediastinal lymph nodes. Salient findings discussed by telephone with Dr. Arther Dames on 04/16/2019 initially at 22:35 and again at 2242 hours. Electronically Signed   By: Odessa Fleming M.D.   On: 04/16/2019 22:50   CT CEREBRAL PERFUSION W CONTRAST  Result Date: 04/16/2019 CLINICAL DATA:  86 year old male code stroke presentation with aphasia, appearance of severe intracranial atherosclerosis. EXAM: CT PERFUSION BRAIN TECHNIQUE: Multiphase CT imaging of the brain was performed following IV bolus contrast injection. Subsequent parametric perfusion maps were calculated using RAPID software. CONTRAST:  71mL OMNIPAQUE IOHEXOL 350 MG/ML SOLN COMPARISON:  CTA head and neck and plain head CT earlier today. FINDINGS: CT Brain Perfusion Findings: CBF (<30%) Volume: 42mL Perfusion (Tmax>6.0s) volume: 71mL, with additional T-max > 8s, and T-max > 10s perfusion defects in that territory. Mismatch Volume: 59mL ASPECTS on noncontrast CT Head: 10 at 2204 hours today. Infarction Location:Left ACA territory. IMPRESSION: CT Perfusion indicates acute distal Left ACA territory ischemia and infarct. This likely corresponds to the severe left distal A2 arterial stenosis (versus occlusion) seen by CTA. Study discussed by telephone with Dr. Arther Dames on 04/16/2019 at 2337 hours. Electronically Signed   By: Odessa Fleming M.D.   On: 04/16/2019 23:44   CT HEAD CODE STROKE WO CONTRAST  Result Date: 04/16/2019 CLINICAL DATA:  Code stroke. Initial evaluation for acute facial droop, slurred speech. EXAM: CT  HEAD WITHOUT CONTRAST TECHNIQUE: Contiguous axial images were obtained from the base of the skull through the vertex without intravenous contrast. COMPARISON:  Prior head CT from 10/04/2005 FINDINGS: Brain: Advanced age-related cerebral atrophy. Confluent hypodensity within the periventricular deep white matter both cerebral hemispheres most consistent with chronic small vessel ischemic disease, moderate to advanced in nature. Multiple scattered superimposed remote lacunar infarct present within the bilateral basal ganglia. Chronic left cerebellar infarcts noted. No acute intracranial hemorrhage. No acute large vessel territory infarct. No mass lesion or midline shift. Diffuse ventricular prominence related to global parenchymal volume loss without hydrocephalus. No extra-axial fluid collection. Vascular: No hyperdense vessel. Skull: Scalp soft tissues and calvarium within normal limits. Sinuses/Orbits: Globes and orbital soft tissues normal. Paranasal sinuses and mastoid air cells are clear. Other: None. ASPECTS [facility] Stroke Program Early CT Score) - Ganglionic level infarction (caudate, lentiform nuclei, internal capsule, insula, M1-M3 cortex): 7 - Supraganglionic infarction (M4-M6 cortex): 3 Total score (0-10 with 10 being normal): 10 IMPRESSION: 1. No acute intracranial abnormality 2. ASPECTS is 10. 3. Advanced age-related cerebral atrophy with chronic small vessel ischemic disease, with multiple remote lacunar infarcts involving the bilateral basal ganglia, with chronic left cerebellar infarcts. These results were communicated to Dr. Laurence Slate at 10:13 pmon 1/29/2021by text page via the [facility] messaging system. Electronically Signed   By: Rise Mu M.D.   On: 04/16/2019 22:15     ASSESSMENT AND PLAN  86 year old male with history of hypertension presents to the ED with sudden onset aphasia, right-sided  weakness.  He received IV TPA after negative head CT.  CTh does not show LVO but multifocal  severe intracranial atherosclerotic disease.  CT perfusion shows acute stroke occurring in the left ACA, likely due to distal A2 occlusion.  Unfortunately this is distal for thrombectomy.  Other consideration should include possible seizure, will obtain EEG although less likely.  Acute Ischemic Stroke s/p IV tPA Likely ACA stroke secondary due to A2 occlusion  Plan #Admit to neuro ICU # MRI of the brain without contrast #Transthoracic Echo  #No antiplatelets for 24 hours after TPA #Start or continue Atorvastatin 40 mg/other high intensity statin after swallow evaluation # BP goal: permissive HTN upto 185/105 mmHg # HBAIC and Lipid profile # Telemetry monitoring # Frequent neuro checks # stroke swallow screen # routine EEG in the morning   Hypertension -Goals as stated above  Please page stroke NP  Or  PA  Or MD from 8am -4 pm  as this patient from this time will be  followed by the stroke.   You can look them up on [email or web address]  Password [email or web address]   This patient is neurologically critically ill due to acute ischemic stroke status post IV TPA.  He is at risk for significant risk of neurological worsening from cerebral edema,  death from brain herniation, heart failure, hemorrhagic conversion, infection, respiratory failure and seizure. This patient's care requires constant monitoring of vital signs, hemodynamics, respiratory and cardiac monitoring, review of multiple databases, neurological assessment, discussion with family, other specialists and medical decision making of high complexity.  I spent 45 minutes of neurocritical time in the care of this patient.     Aldea Avis [facility] Pager Number [phone]

## 2019-04-17 NOTE — Progress Notes (Signed)
  Echocardiogram 2D Echocardiogram has been performed.  Adine Madura 04/17/2019, 3:10 PM

## 2019-04-17 NOTE — Plan of Care (Signed)
  Problem: Nutrition: Goal: Dietary intake will improve Outcome: Progressing   Problem: Ischemic Stroke/TIA Tissue Perfusion: Goal: Complications of ischemic stroke/TIA will be minimized Outcome: Progressing   Problem: Self-Care: Goal: Ability to communicate needs accurately will improve Outcome: Not Progressing

## 2019-04-17 NOTE — Progress Notes (Addendum)
STROKE TEAM PROGRESS NOTE   INTERVAL HISTORY His RN is at the bedside.  Pt lying in bed, pleasant, smiling to me, able to follow commands with close/open eyes but not other commands, still has global aphasia and right hemiplegia. EEG completed. MRI pending. Had bruise at right knee but also significant ecchymosis on the left arm around IV site.  OBJECTIVE Vitals:   04/17/19 0500 04/17/19 0530 04/17/19 0600 04/17/19 0611  BP: (!) 139/95 96/60 103/71 (!) 156/66  Pulse: 85 69 69 73  Resp: 18 19 (!) 24 19  Temp:      TempSrc:      SpO2: 99% 98% 98% 97%  Weight:        CBC:  Recent Labs  Lab 04/16/19 2215 04/16/19 2219  WBC  --  7.2  NEUTROABS  --  5.1  HGB 13.6 14.0  HCT 40.0 40.9  MCV  --  93.4  PLT  --  196    Basic Metabolic Panel:  Recent Labs  Lab 04/16/19 2215 04/16/19 2219  NA 139 139  K 4.1 4.1  CL 106 107  CO2  --  26  GLUCOSE 132* 133*  BUN 25* 24*  CREATININE 1.50* 1.37*  CALCIUM  --  9.1    Lipid Panel:     Component Value Date/Time   CHOL 167 04/17/2019 0300   TRIG 82 04/17/2019 0300   HDL 38 (L) 04/17/2019 0300   CHOLHDL 4.4 04/17/2019 0300   VLDL 16 04/17/2019 0300   LDLCALC 113 (H) 04/17/2019 0300   HgbA1c:  Lab Results  Component Value Date   HGBA1C 5.3 04/17/2019   Urine Drug Screen:     Component Value Date/Time   LABOPIA NONE DETECTED 04/17/2019 0040   COCAINSCRNUR NONE DETECTED 04/17/2019 0040   LABBENZ NONE DETECTED 04/17/2019 0040   AMPHETMU NONE DETECTED 04/17/2019 0040   THCU NONE DETECTED 04/17/2019 0040   LABBARB NONE DETECTED 04/17/2019 0040    Alcohol Level     Component Value Date/Time   ETH <10 04/16/2019 2214    IMAGING  CT ANGIO HEAD W OR WO CONTRAST CT ANGIO NECK W OR WO CONTRAST 04/16/2019 IMPRESSION:  1. Negative for emergent large vessel occlusion, but positive for widespread severe intracranial atherosclerosis.  2. Multifocal large and medium size vessel stenoses in the anterior and posterior  circulation, most notable for: - severe distal Left ICA siphon stenosis due to bulky calcified plaque. - multifocal Left MCA irregularity and stenoses. - up to severe stenoses of both distal Vertebral Arteries, and the Left PCA. - bilateral MCA M3, and Left ACA A2 severe stenoses.  3. But relatively little atherosclerosis in the neck with no significant cervical vertebral or carotid stenosis.  4.  Aortic Atherosclerosis (ICD10-I70.0).  5. Post granulomatous changes in the upper chest including calcified pleural plaques, pulmonary granulomas and mediastinal lymph nodes.   CT CEREBRAL PERFUSION W CONTRAST 04/16/2019 IMPRESSION:  CT Perfusion indicates acute distal Left ACA territory ischemia and infarct. This likely corresponds to the severe left distal A2 arterial stenosis (versus occlusion) seen by CTA.    CT HEAD CODE STROKE WO CONTRAST 04/16/2019 IMPRESSION: 1. No acute intracranial abnormality  2. ASPECTS is 10.  3. Advanced age-related cerebral atrophy with chronic small vessel ischemic disease, with multiple remote lacunar infarcts involving the bilateral basal ganglia, with chronic left cerebellar infarcts.    Transthoracic Echocardiogram  00/00/2021 Pending   ECG - SR rate 72 BPM. (See cardiology reading for complete details)  EEG - pending   PHYSICAL EXAM  Temp:  [98.6 F (37 C)-99 F (37.2 C)] 98.7 F (37.1 C) (01/30 0800) Pulse Rate:  [63-107] 70 (01/30 1000) Resp:  [11-29] 17 (01/30 1000) BP: (96-200)/(32-130) 133/54 (01/30 1000) SpO2:  [94 %-100 %] 97 % (01/30 1000) Weight:  [62.3 kg-65 kg] 62.3 kg (01/30 0235)  General - Well nourished, well developed, in no apparent distress.  Bruise around right knee and significant ecchymosis on left arm around IV site  Ophthalmologic - fundi not visualized due to noncooperation.  Cardiovascular - Regular rhythm and rate.  Neuro - awake alert, making eye contact, tracking bilaterally, smiling and pleasant.  Global aphasia,  only able to follow commands of open/close eyes, but no other commands or speech output.  No gaze preference, right facial droop.  Tongue midline hemolysis.  Left upper extremity at least 3/5, left lower extremity at least 3 -/5.  Right upper and lower extremity slight withdrawal on pain stimulation.  DTR 1+, no Babinski. Sensation, coordination and gait not tested.    ASSESSMENT/PLAN [name] is a 86 y.o. male with history of hypertension and  mild dementia (medical care at [facility]), presents to the emergency department with sudden onset of slurred speech, aphasia, left gaze preference with right-sided neglect, right hemiparesis, facial droop and weakness. He receive IV t-PA Friday 04/16/19 at 2230.  Stroke: Left ACA territory infarct, embolic pattern, source unclear  Code Stroke CT Head - No acute intracranial abnormality, multiple remote lacunar infarcts involving the bilateral basal ganglia, with chronic left cerebellar infarcts.   MRI head - pending  CTA H&N - severe distal Left ICA siphon stenosis due to bulky calcified plaque, multifocal Left MCA irregularity and stenoses, up to severe stenoses of both distal VAs, and the Left PCA. bilateral MCA M3, and Left ACA A2 severe stenoses.   EEG - left frontotemporal region slowing, no seizure  2D Echo - pending  [employer] Virus 2 - negative  LDL - 113  HgbA1c - 5.3  UDS - negative  VTE prophylaxis - SCDs  No antithrombotic prior to admission, now on No antithrombotic within 24h s/p tPA  Patient will be counseled to be compliant with his antithrombotic medications  Ongoing aggressive stroke risk factor management  Therapy recommendations:  pending  Disposition:  Pending  Hypertension  Home BP meds: Zestril  Current BP meds: none   Blood pressure somewhat low at times. . Permissive hypertension (OK if < 180/105) but gradually normalize in 5-7 days  . Long-term BP goal normotensive  Hyperlipidemia  Home Lipid  lowering medication: none  LDL 113, goal < 70  Current lipid lowering medication: on lipitor 40  Continue statin at discharge  Other Stroke Risk Factors  Advanced age  Hx stroke/TIA - by imaging  Other Active Problems  CKD - stage 3a, Cre 1.50->1.37  Mild dementia - on galantamine, continue  Fall - right knee bruise   Hospital day # 1  This patient is critically ill due to stroke s/p tPA, fall and at significant risk of neurological worsening, death form recurrent stroke, hemorrhagic conversion, heart failure. This patient's care requires constant monitoring of vital signs, hemodynamics, respiratory and cardiac monitoring, review of multiple databases, neurological assessment, discussion with family, other specialists and medical decision making of high complexity. I spent 35 minutes of neurocritical care time in the care of this patient.  Attempted to give daughter call for update, she was not available over the phone.  Corwin Kuiken  Erlinda Hong, MD PhD Stroke Neurology 04/17/2019 2:34 PM  ADDENDUM I had long discussion with daughter over the phone, updated pt current condition, treatment plan and potential prognosis, and answered all the questions. She expressed understanding and appreciation.   [name], MD PhD Stroke Neurology 04/17/2019 5:13 PM   To contact Stroke Continuity provider, please refer to [email or web address] After hours, contact General Neurology

## 2019-04-17 NOTE — Progress Notes (Signed)
EEG complete - results pending 

## 2019-04-17 NOTE — Progress Notes (Signed)
Pt to floor with hematoma to left lower ext, discoloration noted. No active bleeding present, new drsg applied.

## 2019-04-17 NOTE — ED Notes (Signed)
Pt given 20mg  labetolol per MD Aroor.   Verbal order with verbal read-back by this RN.

## 2019-04-17 NOTE — Evaluation (Signed)
Clinical/Bedside Swallow Evaluation Patient Details  Name: Scott Gilbert MRN: [id number] Date of Birth: 1929-10-12  Today's Date: 04/17/2019 Time: SLP Start Time (ACUTE ONLY): 0818 SLP Stop Time (ACUTE ONLY): 0830 SLP Time Calculation (min) (ACUTE ONLY): 12 min  Past Medical History: No past medical history on file.   HPI:  Scott Gilbert is a 86 y.o. male with past medical history significant for hypertension, mild dementia presented to the emergency department with sudden onset of slurred speech and aphasia, facial droop and weakness.   CT cerebral perfusion on 04/16/19 reported: "acute distal Left ACA territory ischemia and infarct."    Assessment / Plan / Recommendation Clinical Impression  Pt was seen for a bedside swallow evaluation and he presents with mild oral dysphagia and possible pharyngeal dysphagia.  Oral mech exam was remarkable for facial asymmetry on the R side and lingual deviation to the R, possibly indicative of lingual weakness.  Additionally observed top dentures and edentulism on the bottom.  Pt consumed trials of ice chips, thin liquid, puree, and regular solids.  He exhibited good bolus acceptance and labial closure around the spoon and straw.  Mastication was prolonged with ice chip and regular solid trials and pt exhibited an immediate, strong cough following the regular solid trial.  No additional s/sx of aspiration were observed with any trials including during the [employer].  Pt exhibited good oral clearance with all trials and no residue was observed.  He presented with some repetitive lingual pumping in the presence of and in the absence of PO trials.  Suspect that pt was perseverating on this motor movement.  Recommend initiation of Dysphagia 1 (puree) solids and thin liquids with medications administered crushed in puree.  Pt will benefit from full supervision to assist with feeding and to cue for the following compensatory strategies: 1) Small  bites/sips 2) Slow rate of intake 3) Sit upright as possible 4) Limit distractions.   SLP Visit Diagnosis: Dysphagia, unspecified (R13.10)    Aspiration Risk  Mild aspiration risk    Diet Recommendation Dysphagia 1 (Puree);Thin liquid   Liquid Administration via: Cup;Straw Medication Administration: Crushed with puree Supervision: Full supervision/cueing for compensatory strategies;Staff to assist with self feeding Compensations: Minimize environmental distractions;Slow rate;Small sips/bites Postural Changes: Seated upright at 90 degrees    Other  Recommendations Oral Care Recommendations: Oral care BID;Staff/trained caregiver to provide oral care   Follow up Recommendations Skilled Nursing facility      Frequency and Duration min 2x/week  2 weeks       Prognosis Prognosis for Safe Diet Advancement: Good Barriers to Reach Goals: Language deficits      Swallow Study   General HPI: Scott Gilbert is a 86 y.o. male with past medical history significant for hypertension, mild dementia presented to the emergency department with sudden onset of slurred speech and aphasia, facial droop and weakness.   CT cerebral perfusion on 04/16/19 reported: "acute distal Left ACA territory ischemia and infarct."  Type of Study: Bedside Swallow Evaluation Previous Swallow Assessment: None  Diet Prior to this Study: NPO Temperature Spikes Noted: Yes Respiratory Status: Room air History of Recent Intubation: No Behavior/Cognition: Alert;Pleasant mood;Doesn't follow directions Oral Cavity Assessment: Within Functional Limits Oral Care Completed by SLP: Yes Oral Cavity - Dentition: Dentures, top(edentulous on bottom) Self-Feeding Abilities: Total assist Patient Positioning: Upright in bed Baseline Vocal Quality: Other (comment)(limited observations ) Volitional Cough: Strong Volitional Swallow: Unable to elicit    Oral/Motor/Sensory Function Overall Oral Motor/Sensory  Function: Moderate  impairment Facial ROM: Reduced right Facial Symmetry: Abnormal symmetry right Lingual Symmetry: Abnormal symmetry right   Ice Chips Ice chips: Within functional limits Presentation: Spoon   Thin Liquid Thin Liquid: Within functional limits Presentation: Spoon;Straw    Nectar Thick Nectar Thick Liquid: Not tested   Honey Thick Honey Thick Liquid: Not tested   Puree Puree: Within functional limits Presentation: Spoon   Solid     Solid: Impaired Presentation: Spoon Oral Phase Impairments: Impaired mastication Oral Phase Functional Implications: Prolonged oral transit;Impaired mastication Pharyngeal Phase Impairments: Cough - Immediate     Colin Mulders., M.S., CCC-SLP Acute Rehabilitation Services Office: [phone]  Elvia Collum Sheilia Reznick 04/17/2019,8:56 AM

## 2019-04-17 NOTE — Procedures (Addendum)
Patient Name: Scott Gilbert  MRN: [id number]  Epilepsy Attending: Charlsie Quest  Referring Physician/Provider: Dr Georgiana Spinner  Aroor Date: 04/17/2019 Duration: 24.38 mins  Patient history: 86 year old male with history of hypertension presents to the ED with sudden onset aphasia, right-sided weakness. EEG to assess for seizure.  Level of alertness: awake  AEDs during EEG study: None  Technical aspects: This EEG study was done with scalp electrodes positioned according to the 10-20 International system of electrode placement. Electrical activity was acquired at a sampling rate of 500Hz  and reviewed with a high frequency filter of 70Hz  and a low frequency filter of 1Hz . EEG data were recorded continuously and digitally stored.   DESCRIPTION: The posterior dominant rhythm consists of 8 Hz activity of moderate voltage (25-35 uV) seen predominantly in posterior head regions, symmetric and reactive to eye opening and eye closing. Drowsiness was characterized by attenuation of the posterior background rhythm. EEG showed continuous 3-5hz  theta-delta slowing in left frontotemporal region. Hyperventilation and photic stimulation were not performed.  ABNORMALITY - Continuous slow, left frontotemporal region  IMPRESSION: This study is suggestive of cortical dysfunction in left frontotemporal region, likely secondary to underlying structural abnormality. No seizures or definite epileptiform discharges were seen throughout the recording.    [name] 

## 2019-04-17 NOTE — Progress Notes (Signed)
PT Cancellation Note  Patient Details Name: Scott Gilbert MRN: [id number] DOB: 09-23-1929   Cancelled Treatment:    Reason Eval/Treat Not Completed: Active bedrest order     Lillia Pauls, PT, DPT Acute Rehabilitation Services Pager [phone] Office [phone]    Norval Morton 04/17/2019, 8:07 AM

## 2019-04-18 DIAGNOSIS — E7800 Pure hypercholesterolemia, unspecified: Secondary | ICD-10-CM

## 2019-04-18 DIAGNOSIS — I10 Essential (primary) hypertension: Secondary | ICD-10-CM

## 2019-04-18 DIAGNOSIS — F0280 Dementia in other diseases classified elsewhere without behavioral disturbance: Secondary | ICD-10-CM

## 2019-04-18 DIAGNOSIS — N179 Acute kidney failure, unspecified: Secondary | ICD-10-CM

## 2019-04-18 LAB — BASIC METABOLIC PANEL
Anion gap: 9 (ref 5–15)
BUN: 17 mg/dL (ref 8–23)
CO2: 23 mmol/L (ref 22–32)
Calcium: 8.7 mg/dL — ABNORMAL LOW (ref 8.9–10.3)
Chloride: 106 mmol/L (ref 98–111)
Creatinine, Ser: 1.19 mg/dL (ref 0.61–1.24)
GFR calc Af Amer: >60 mL/min (ref >60)
GFR calc non Af Amer: 55 mL/min — ABNORMAL LOW (ref >60)
Glucose, Bld: 110 mg/dL — ABNORMAL HIGH (ref 70–99)
Potassium: 3.8 mmol/L (ref 3.5–5.1)
Sodium: 138 mmol/L (ref 135–145)

## 2019-04-18 LAB — CBC
HCT: 38.3 % — ABNORMAL LOW (ref 39.0–52.0)
Hemoglobin: 13.2 g/dL (ref 13.0–17.0)
MCH: 31.9 pg (ref 26.0–34.0)
MCHC: 34.5 g/dL (ref 30.0–36.0)
MCV: 92.5 fL (ref 80.0–100.0)
Platelets: 175 10*3/uL (ref 150–400)
RBC: 4.14 MIL/uL — ABNORMAL LOW (ref 4.22–5.81)
RDW: 11.9 % (ref 11.5–15.5)
WBC: 9.1 10*3/uL (ref 4.0–10.5)
nRBC: 0 % (ref 0.0–0.2)

## 2019-04-18 MED ORDER — ENOXAPARIN SODIUM 40 MG/0.4ML ~~LOC~~ SOLN: 40.0000 mg | SUBCUTANEOUS | Status: DC

## 2019-04-18 MED ORDER — LISINOPRIL 5 MG PO TABS
5.0000 mg | ORAL_TABLET | Freq: Every day | ORAL | Status: DC
  Administered 2019-04-18 – 2019-04-26 (×9): 5 mg via ORAL
  Filled 2019-04-18 (×9): qty 1

## 2019-04-18 MED ORDER — CLOPIDOGREL BISULFATE 75 MG PO TABS
75.0000 mg | ORAL_TABLET | Freq: Every day | ORAL | Status: DC
  Administered 2019-04-18 – 2019-07-02 (×76): 75 mg via ORAL
  Filled 2019-04-18 (×76): qty 1

## 2019-04-18 MED ORDER — ENOXAPARIN SODIUM 30 MG/0.3ML ~~LOC~~ SOLN
30.0000 mg | SUBCUTANEOUS | Status: DC
  Administered 2019-04-18 – 2019-07-02 (×76): 30 mg via SUBCUTANEOUS
  Filled 2019-04-18 (×76): qty 0.3

## 2019-04-18 MED ORDER — ACYCLOVIR 400 MG PO TABS
400.0000 mg | ORAL_TABLET | Freq: Two times a day (BID) | ORAL | Status: DC
  Administered 2019-04-18 – 2019-07-02 (×151): 400 mg via ORAL
  Filled 2019-04-18 (×154): qty 1

## 2019-04-18 MED ORDER — ASPIRIN EC 325 MG PO TBEC
325.0000 mg | DELAYED_RELEASE_TABLET | Freq: Every day | ORAL | Status: DC
  Administered 2019-04-18 – 2019-06-11 (×55): 325 mg via ORAL
  Filled 2019-04-18 (×55): qty 1

## 2019-04-18 MED ORDER — SODIUM CHLORIDE 0.9 % IV SOLN: INTRAVENOUS | Status: AC

## 2019-04-18 NOTE — Evaluation (Signed)
Occupational Therapy Evaluation Patient Details Name: Scott Gilbert MRN: [id number] DOB: 1929/05/03 Today's Date: 04/18/2019    History of Present Illness 86 y.o. male with past medical history significant for hypertension and mild dementia. He presented to the emergency department with sudden onset of slurred speech and aphasia, facial droop and weakness.   CT cerebral perfusion on 04/16/19 reported: "acute distal Left ACA territory ischemia and infarct."   Clinical Impression   Pt PTA: Per chart, pt was living alone and presumably independent prior. Pt was discovered by family so assume support of family is present. Pt currently limited by decreased attention, global aphasia, poor ability to follow commands, decreased strength and poor ability to care for self. Pt with visual deficits, but L sided gaze preference minimally evident today. Pt following OTR in room and passing midline with b/l eyes. Pt with weakness on R side; flexor tone noted, does not appear flaccid. Pt following 1 command to squeeze with L hand. Pt minA +2 for bed mobility; modA +2 for stand pivot. Pt unable to move RLE at this time, but appears to bear weight through it. Pt would greatly benefit from continued OT skilled services. OT following acutely.    Follow Up Recommendations  CIR;Supervision/Assistance - 24 hour    Equipment Recommendations  Other (comment)(to be determined at next venue)    Recommendations for Other Services       Precautions / Restrictions Precautions Precautions: Fall Restrictions Weight Bearing Restrictions: No      Mobility Bed Mobility Overal bed mobility: Needs Assistance Bed Mobility: Supine to Sit     Supine to sit: HOB elevated;Mod assist;Min assist;+2 for physical assistance     General bed mobility comments: tactile cues for sequencing, assist with BLE and to elevate trunk  Transfers Overall transfer level: Needs assistance Equipment used: 2 person hand held  assist Transfers: Sit to/from Stand;Stand Pivot Transfers Sit to Stand: +2 physical assistance;Min assist;Mod assist Stand pivot transfers: +2 physical assistance;Mod assist       General transfer comment: pivot steps toward left bed to recliner, decreased weight shift to right    Balance Overall balance assessment: Needs assistance Sitting-balance support: No upper extremity supported;Feet supported Sitting balance-Leahy Scale: Fair Sitting balance - Comments: static sitting EOB min guard assist   Standing balance support: Bilateral upper extremity supported;During functional activity Standing balance-Leahy Scale: Poor Standing balance comment: reliant on external support                           ADL either performed or assessed with clinical judgement   ADL Overall ADL's : Needs assistance/impaired Eating/Feeding: Total assistance   Grooming: Maximal assistance   Upper Body Bathing: Maximal assistance;Total assistance;Sitting   Lower Body Bathing: Maximal assistance;+2 for physical assistance;+2 for safety/equipment;Sit to/from stand   Upper Body Dressing : Maximal assistance;Total assistance;Sitting   Lower Body Dressing: Maximal assistance;Total assistance;+2 for physical assistance;Sitting/lateral leans;Sit to/from stand   Toilet Transfer: Moderate assistance;+2 for physical assistance;Stand-pivot   Toileting- Clothing Manipulation and Hygiene: Maximal assistance;+2 for physical assistance;+2 for safety/equipment;Sit to/from stand       Functional mobility during ADLs: Moderate assistance;+2 for physical assistance;+2 for safety/equipment;Cueing for safety;Cueing for sequencing General ADL Comments: Pt limited by decreased attention, global aphasia, poor ability to follow commands, decreased strength and poor ability to care for self.     Vision   Vision Assessment?: Yes Eye Alignment: Within Functional Limits Ocular Range of Motion: Within Functional  Limits Alignment/Gaze Preference: Gaze left(Pt able to turn head and gaze straight/crosses midline) Tracking/Visual Pursuits: Able to track stimulus in all quads without difficulty Additional Comments: Need to continue to assess     Perception     Praxis      Pertinent Vitals/Pain Pain Assessment: Faces Faces Pain Scale: No hurt     Hand Dominance Left   Extremity/Trunk Assessment Upper Extremity Assessment Upper Extremity Assessment: Generalized weakness;RUE deficits/detail;LUE deficits/detail RUE Deficits / Details: Flexor tone; unable to achieve complete elbow extension; hand  flexion present RUE Coordination: decreased fine motor;decreased gross motor   Lower Extremity Assessment Lower Extremity Assessment: Defer to PT evaluation;Generalized weakness RLE Deficits / Details: increased tone, decreased active movement   Cervical / Trunk Assessment Cervical / Trunk Assessment: Normal   Communication Communication Communication: Receptive difficulties;Expressive difficulties   Cognition Arousal/Alertness: Awake/alert Behavior During Therapy: WFL for tasks assessed/performed Overall Cognitive Status: Difficult to assess                                 General Comments: global aphasia. Pleasant and smiling. No verbalizations. Following command to hand squeeze on L side. Receptive to tactile and visual cues.   General Comments       Exercises     Shoulder Instructions      Home Living Family/patient expects to be discharged to:: Private residence Living Arrangements: Alone                               Additional Comments: Pt unable to provide history/home set up. Pt from home per chart. Onset of symptoms witnessed by family who called EMS.      Prior Functioning/Environment Level of Independence: Independent                 OT Problem List: Decreased strength;Decreased activity tolerance;Impaired balance (sitting and/or  standing);Decreased safety awareness;Pain      OT Treatment/Interventions: Self-care/ADL training;Therapeutic exercise;Neuromuscular education;Energy conservation;DME and/or AE instruction;Therapeutic activities;Patient/family education;Balance training;Visual/perceptual remediation/compensation;Cognitive remediation/compensation    OT Goals(Current goals can be found in the care plan section) Acute Rehab OT Goals Patient Stated Goal: unable to state OT Goal Formulation: Patient unable to participate in goal setting Time For Goal Achievement: 05/02/19 Potential to Achieve Goals: Good ADL Goals Pt Will Perform Grooming: with min assist;sitting Pt Will Perform Upper Body Dressing: with min assist;sitting Pt Will Transfer to Toilet: with min assist;stand pivot transfer;bedside commode Pt Will Perform Toileting - Clothing Manipulation and hygiene: with min assist;sitting/lateral leans;sit to/from stand Pt/caregiver will Perform Home Exercise Program: Increased strength;Right Upper extremity;Increased ROM;With minimal assist Additional ADL Goal #1: Pt will participate in x3 mins of ADL task with moderate cueing to attend.  OT Frequency: Min 2X/week   Barriers to D/C:            Co-evaluation PT/OT/SLP Co-Evaluation/Treatment: Yes Reason for Co-Treatment: Complexity of the patient's impairments (multi-system involvement);To address functional/ADL transfers PT goals addressed during session: Mobility/safety with mobility;Balance OT goals addressed during session: ADL's and self-care;Strengthening/ROM      AM-PAC OT "6 Clicks" Daily Activity     Outcome Measure Help from another person eating meals?: Total Help from another person taking care of personal grooming?: Total Help from another person toileting, which includes using toliet, bedpan, or urinal?: Total Help from another person bathing (including washing, rinsing, drying)?: Total Help from another person to put on  and taking off  regular upper body clothing?: A Lot Help from another person to put on and taking off regular lower body clothing?: Total 6 Click Score: 7   End of Session Equipment Utilized During Treatment: Gait belt Nurse Communication: Mobility status  Activity Tolerance: Patient tolerated treatment well Patient left: in chair;with call bell/phone within reach;with chair alarm set  OT Visit Diagnosis: Unsteadiness on feet (R26.81);Muscle weakness (generalized) (M62.81)                Time: 6759-1638 OT Time Calculation (min): 26 min Charges:  OT General Charges $OT Visit: 1 Visit OT Evaluation $OT Eval Moderate Complexity: 1 Mod  Flora Lipps OTR/L Acute Rehabilitation Services Pager: [phone] Office: [phone]   Adriahna Shearman C 04/18/2019, 1:27 PM

## 2019-04-18 NOTE — Progress Notes (Addendum)
STROKE TEAM PROGRESS NOTE   INTERVAL HISTORY His RN is at the bedside.  Pt sitting in chair, tracking bilaterally, still has global aphasia and right hemiparesis. PT/OT recommend CIR. MRI showed left ACA large infarct.   OBJECTIVE Vitals:   04/18/19 0400 04/18/19 0500 04/18/19 0600 04/18/19 0700  BP: (!) 149/85 (!) 168/66 139/70 (!) 155/65  Pulse:    69  Resp: 14 18 18 15   Temp: 98.5 F (36.9 C)     TempSrc: Oral     SpO2:    97%  Weight:        CBC:  Recent Labs  Lab 04/16/19 2219 04/18/19 0231  WBC 7.2 9.1  NEUTROABS 5.1  --   HGB 14.0 13.2  HCT 40.9 38.3*  MCV 93.4 92.5  PLT 196 175    Basic Metabolic Panel:  Recent Labs  Lab 04/16/19 2219 04/18/19 0231  NA 139 138  K 4.1 3.8  CL 107 106  CO2 26 23  GLUCOSE 133* 110*  BUN 24* 17  CREATININE 1.37* 1.19  CALCIUM 9.1 8.7*    Lipid Panel:     Component Value Date/Time   CHOL 167 04/17/2019 0300   TRIG 82 04/17/2019 0300   HDL 38 (L) 04/17/2019 0300   CHOLHDL 4.4 04/17/2019 0300   VLDL 16 04/17/2019 0300   LDLCALC 113 (H) 04/17/2019 0300   HgbA1c:  Lab Results  Component Value Date   HGBA1C 5.3 04/17/2019   Urine Drug Screen:     Component Value Date/Time   LABOPIA NONE DETECTED 04/17/2019 0040   COCAINSCRNUR NONE DETECTED 04/17/2019 0040   LABBENZ NONE DETECTED 04/17/2019 0040   AMPHETMU NONE DETECTED 04/17/2019 0040   THCU NONE DETECTED 04/17/2019 0040   LABBARB NONE DETECTED 04/17/2019 0040    Alcohol Level     Component Value Date/Time   ETH <10 04/16/2019 2214    IMAGING  CT ANGIO HEAD W OR WO CONTRAST CT ANGIO NECK W OR WO CONTRAST 04/16/2019 IMPRESSION:  1. Negative for emergent large vessel occlusion, but positive for widespread severe intracranial atherosclerosis.  2. Multifocal large and medium size vessel stenoses in the anterior and posterior circulation, most notable for: - severe distal Left ICA siphon stenosis due to bulky calcified plaque. - multifocal Left MCA  irregularity and stenoses. - up to severe stenoses of both distal Vertebral Arteries, and the Left PCA. - bilateral MCA M3, and Left ACA A2 severe stenoses.  3. But relatively little atherosclerosis in the neck with no significant cervical vertebral or carotid stenosis.  4.  Aortic Atherosclerosis (ICD10-I70.0).  5. Post granulomatous changes in the upper chest including calcified pleural plaques, pulmonary granulomas and mediastinal lymph nodes.   CT CEREBRAL PERFUSION W CONTRAST 04/16/2019 IMPRESSION:  CT Perfusion indicates acute distal Left ACA territory ischemia and infarct. This likely corresponds to the severe left distal A2 arterial stenosis (versus occlusion) seen by CTA.    CT HEAD CODE STROKE WO CONTRAST 04/16/2019 IMPRESSION: 1. No acute intracranial abnormality  2. ASPECTS is 10.  3. Advanced age-related cerebral atrophy with chronic small vessel ischemic disease, with multiple remote lacunar infarcts involving the bilateral basal ganglia, with chronic left cerebellar infarcts.   MRI Head WO Contrast 04/17/2019 IMPRESSION: 1. Moderate-sized acute ischemic nonhemorrhagic left ACA territory infarct without associated mass effect. 2. Moderate to advanced age-related cerebral atrophy with chronicvsmall vessel ischemic disease and multiple remote lacunar infarctsvabout the bilateral basal ganglia, with additional chronic leftvcerebellar infarcts.   Transthoracic Echocardiogram  04/17/2019  IMPRESSIONS  1. Left ventricular ejection fraction, by visual estimation, is 70 to 75%. The left ventricle has hyperdynamic function. There is no left ventricular hypertrophy.  2. Left ventricular diastolic parameters are consistent with Grade I diastolic dysfunction (impaired relaxation).  3. The left ventricle has no regional wall motion abnormalities.  4. Global right ventricle has normal systolic function.The right ventricular size is normal. No increase in right ventricular wall  thickness.  5. Left atrial size was normal.  6. Right atrial size was normal.  7. The mitral valve is normal in structure. Mild mitral valve  regurgitation. No evidence of mitral stenosis.  8. The tricuspid valve is normal in structure.  9. The tricuspid valve is normal in structure. Tricuspid valve  regurgitation is mild-moderate.  10. The aortic valve is normal in structure. Aortic valve regurgitation is trivial. No evidence of aortic valve sclerosis or stenosis.  11. The pulmonic valve was normal in structure. Pulmonic valve regurgitation is trivial.  12. Mildly elevated pulmonary artery systolic pressure.  13. The tricuspid regurgitant velocity is 2.64 m/s, and with an assumed right atrial pressure of 3 mmHg, the estimated right ventricular systolic pressure is mildly elevated at 30.9 mmHg.  14. The inferior vena cava is normal in size with greater than 50% respiratory variability, suggesting right atrial pressure of 3 mmHg.  15. No intracardiac source of embolism detected on this transthoracic study. A transesophageal echocardiogram is recommended to exclude cardiac source of embolism if clinically indicated.    ECG - SR rate 72 BPM. (See cardiology reading for complete details)  EEG  04/17/19 IMPRESSION: This study is suggestive of cortical dysfunction in left frontotemporal region, likely secondary to underlying structural abnormality. No seizures or definite epileptiform discharges were seen throughout the recording.   PHYSICAL EXAM  Temp:  [97.7 F (36.5 C)-100 F (37.8 C)] 98.5 F (36.9 C) (01/31 0400) Pulse Rate:  [56-123] 69 (01/31 0700) Resp:  [11-25] 15 (01/31 0700) BP: (116-194)/(53-102) 155/65 (01/31 0700) SpO2:  [94 %-100 %] 97 % (01/31 0700)  General - Well nourished, well developed, in no apparent distress.  Bruise around right knee and significant ecchymosis on left arm around IV site  Ophthalmologic - fundi not visualized due to  noncooperation.  Cardiovascular - Regular rhythm and rate.  Neuro - awake alert, making eye contact, tracking bilaterally.  Global aphasia, did not follow commands, no speech output.  No gaze preference, blinking to visual threat bilaterally. Right facial droop.  Tongue midline hemolysis.  Left upper extremity at least 3/5, left lower extremity at least 3-/5.  Right upper and lower extremity mild withdrawal on pain stimulation.  DTR 1+, no Babinski. Sensation, coordination and gait not tested.    ASSESSMENT/PLAN [name] is a 86 y.o. male with history of hypertension and  mild dementia (medical care at [facility]), presents to the emergency department with sudden onset of slurred speech, aphasia, left gaze preference with right-sided neglect, right hemiparesis, facial droop and weakness. He received IV t-PA Friday 04/16/19 at 2230.  Stroke: Left ACA territory infarct, likely due to large vessel source although cardioembolic source not excluded  Code Stroke CT Head - No acute intracranial abnormality, multiple remote lacunar infarcts involving the bilateral basal ganglia, with chronic left cerebellar infarcts.   MRI head - Moderate-sized acute ischemic nonhemorrhagic left ACA territory infarct  CTA H&N - severe distal Left ICA siphon stenosis due to bulky calcified plaque, multifocal Left MCA irregularity and stenoses, up to severe stenoses of  both distal VAs, and the Left PCA. bilateral MCA M3, and Left ACA A2 severe stenoses.   EEG - cortical dysfunction in left frontotemporal region, no seizures.  2D Echo - EF 70-75%. No intracardiac source of embolism   LE venous doppler pending  Consider 30 day cardiac event monitoring as outpt  [employer] Virus 2 - negative  LDL - 113  HgbA1c - 5.3  UDS - negative  VTE prophylaxis - lovenox  No antithrombotic prior to admission, now on ASA 325 and plavix DAPT for 3 months and then ASA alone given significant intracranial  stenosis  Ongoing aggressive stroke risk factor management  Therapy recommendations: CIR  Disposition:  Pending  Hypertension  Home BP meds: Zestril  Current BP meds: none   BP stable . Permissive hypertension (OK if < 180/105) but gradually normalize in 5-7 days  . Long-term BP goal normotensive  Hyperlipidemia  Home Lipid lowering medication: none  LDL 113, goal < 70  Current lipid lowering medication: on lipitor 40  Continue statin at discharge  Other Stroke Risk Factors  Advanced age  Hx stroke/TIA - by imaging  Other Active Problems  CKD - stage 3a, Cre 1.50->1.37->1.19  Dementia - on galantamine and Risperdal   Fall - right knee bruise   Hospital day # 2  This patient is critically ill due to stroke s/p tPA, fall and at significant risk of neurological worsening, death form recurrent stroke, hemorrhagic conversion, heart failure. This patient's care requires constant monitoring of vital signs, hemodynamics, respiratory and cardiac monitoring, review of multiple databases, neurological assessment, discussion with family, other specialists and medical decision making of high complexity. I spent 35 minutes of neurocritical care time in the care of this patient.    [name], MD PhD Stroke Neurology 04/18/2019 3:27 PM  To contact Stroke Continuity provider, please refer to [email or web address] After hours, contact General Neurology

## 2019-04-18 NOTE — Progress Notes (Signed)
Rehab Admissions Coordinator Note:  Patient was screened by Clois Dupes for appropriateness for an Inpatient Acute Rehab Consult per therapy recs.  At this time, we are recommending Inpatient Rehab consult. I will place order.  Clois Dupes 04/18/2019, 4:18 PM  I can be reached at [phone].

## 2019-04-18 NOTE — Evaluation (Signed)
Physical Therapy Evaluation Patient Details Name: Scott Gilbert MRN: [id number] DOB: 1929/08/28 Today's Date: 04/18/2019   History of Present Illness  86 y.o. male with past medical history significant for hypertension and mild dementia. He presented to the emergency department with sudden onset of slurred speech and aphasia, facial droop and weakness.   CT cerebral perfusion on 04/16/19 reported: "acute distal Left ACA territory ischemia and infarct."    Clinical Impression  Pt admitted with above diagnosis. PTA pt lived at home independent. Unsure if he lived alone or with family. On eval, he required +2 min/mod assist bed mobility, +2 min/mod assist sit to stand and +2 mod assist SPT. Pt able to achieve full upright stance, maintaining static stand with min assist, decreased weight shift toward right. Pt globally aphasic, pleasant and smiling throughout session. Pt currently with functional limitations due to the deficits listed below (see PT Problem List). Pt will benefit from skilled PT to increase their independence and safety with mobility to allow discharge to the venue listed below.       Follow Up Recommendations CIR;Supervision/Assistance - 24 hour    Equipment Recommendations  Other (comment)(TBD)    Recommendations for Other Services Rehab consult     Precautions / Restrictions Precautions Precautions: Fall Restrictions Weight Bearing Restrictions: No      Mobility  Bed Mobility Overal bed mobility: Needs Assistance Bed Mobility: Supine to Sit     Supine to sit: HOB elevated;Mod assist;Min assist;+2 for physical assistance     General bed mobility comments: tactile cues for sequencing, assist with BLE and to elevate trunk  Transfers Overall transfer level: Needs assistance Equipment used: 2 person hand held assist Transfers: Sit to/from Stand;Stand Pivot Transfers Sit to Stand: +2 physical assistance;Min assist;Mod assist Stand pivot transfers: +2 physical  assistance;Mod assist       General transfer comment: pivot steps toward left bed to recliner, decreased weight shift to right  Ambulation/Gait                Stairs            Wheelchair Mobility    Modified Rankin (Stroke Patients Only) Modified Rankin (Stroke Patients Only) Pre-Morbid Rankin Score: No symptoms Modified Rankin: Severe disability     Balance Overall balance assessment: Needs assistance Sitting-balance support: No upper extremity supported;Feet supported Sitting balance-Leahy Scale: Fair Sitting balance - Comments: static sitting EOB min guard assist   Standing balance support: Bilateral upper extremity supported;During functional activity Standing balance-Leahy Scale: Poor Standing balance comment: reliant on external support                             Pertinent Vitals/Pain Pain Assessment: Faces Faces Pain Scale: No hurt    Home Living Family/patient expects to be discharged to:: Private residence                 Additional Comments: Pt unable to provide history/home set up. Pt from home per chart. Onset of symptoms witnessed by family who called EMS.    Prior Function Level of Independence: Independent               Hand Dominance        Extremity/Trunk Assessment   Upper Extremity Assessment Upper Extremity Assessment: Defer to OT evaluation    Lower Extremity Assessment Lower Extremity Assessment: RLE deficits/detail RLE Deficits / Details: increased tone, decreased active movement  Communication   Communication: Receptive difficulties;Expressive difficulties  Cognition Arousal/Alertness: Awake/alert Behavior During Therapy: WFL for tasks assessed/performed Overall Cognitive Status: Difficult to assess                                 General Comments: global aphasia. Pleasant and smiling. No verbalizations. Not following commands. Receptive to tactile and visual cues.       General Comments      Exercises     Assessment/Plan    PT Assessment Patient needs continued PT services  PT Problem List Decreased mobility;Decreased strength;Decreased safety awareness;Decreased knowledge of precautions;Decreased activity tolerance;Decreased cognition;Decreased balance;Decreased knowledge of use of DME       PT Treatment Interventions DME instruction;Therapeutic activities;Cognitive remediation;Gait training;Therapeutic exercise;Patient/family education;Balance training;Functional mobility training    PT Goals (Current goals can be found in the Care Plan section)  Acute Rehab PT Goals Patient Stated Goal: unable to state PT Goal Formulation: Patient unable to participate in goal setting Time For Goal Achievement: 05/02/19 Potential to Achieve Goals: Good    Frequency Min 4X/week   Barriers to discharge        Co-evaluation PT/OT/SLP Co-Evaluation/Treatment: Yes Reason for Co-Treatment: For patient/therapist safety;Necessary to address cognition/behavior during functional activity;To address functional/ADL transfers PT goals addressed during session: Mobility/safety with mobility;Balance         AM-PAC PT "6 Clicks" Mobility  Outcome Measure Help needed turning from your back to your side while in a flat bed without using bedrails?: A Lot Help needed moving from lying on your back to sitting on the side of a flat bed without using bedrails?: A Lot Help needed moving to and from a bed to a chair (including a wheelchair)?: A Lot Help needed standing up from a chair using your arms (e.g., wheelchair or bedside chair)?: A Lot Help needed to walk in hospital room?: A Lot Help needed climbing 3-5 steps with a railing? : Total 6 Click Score: 11    End of Session Equipment Utilized During Treatment: Gait belt Activity Tolerance: Patient tolerated treatment well Patient left: in chair;with chair alarm set;with call bell/phone within reach Nurse  Communication: Mobility status PT Visit Diagnosis: Other abnormalities of gait and mobility (R26.89);Hemiplegia and hemiparesis Hemiplegia - Right/Left: Right Hemiplegia - caused by: Cerebral infarction    Time: 6812-7517 PT Time Calculation (min) (ACUTE ONLY): 26 min   Charges:   PT Evaluation $PT Eval Moderate Complexity: 1 Mod          Aida Raider, PT  Office # [phone] Pager [phone]   Ilda Foil 04/18/2019, 10:49 AM

## 2019-04-18 NOTE — Progress Notes (Signed)
Orthopedic Tech Progress Note Patient Details:  Scott Gilbert [birth year] [id number] Called in order to [employer] for a RESTING HAND SPLINT Patient ID: [name], male   DOB: 01-07-1930, 86 y.o.   MRN: [id number]   [name] 04/18/2019, 3:57 PM

## 2019-04-19 ENCOUNTER — Inpatient Hospital Stay (HOSPITAL_COMMUNITY): Payer: No Typology Code available for payment source

## 2019-04-19 ENCOUNTER — Encounter (HOSPITAL_COMMUNITY): Payer: Self-pay | Admitting: Neurology

## 2019-04-19 DIAGNOSIS — I639 Cerebral infarction, unspecified: Secondary | ICD-10-CM

## 2019-04-19 LAB — BASIC METABOLIC PANEL
Anion gap: 9 (ref 5–15)
BUN: 21 mg/dL (ref 8–23)
CO2: 23 mmol/L (ref 22–32)
Calcium: 8.5 mg/dL — ABNORMAL LOW (ref 8.9–10.3)
Chloride: 106 mmol/L (ref 98–111)
Creatinine, Ser: 1.46 mg/dL — ABNORMAL HIGH (ref 0.61–1.24)
GFR calc Af Amer: 49 mL/min — ABNORMAL LOW (ref >60)
GFR calc non Af Amer: 43 mL/min — ABNORMAL LOW (ref >60)
Glucose, Bld: 152 mg/dL — ABNORMAL HIGH (ref 70–99)
Potassium: 3.7 mmol/L (ref 3.5–5.1)
Sodium: 138 mmol/L (ref 135–145)

## 2019-04-19 LAB — CBC
HCT: 34.6 % — ABNORMAL LOW (ref 39.0–52.0)
Hemoglobin: 11.9 g/dL — ABNORMAL LOW (ref 13.0–17.0)
MCH: 31.5 pg (ref 26.0–34.0)
MCHC: 34.4 g/dL (ref 30.0–36.0)
MCV: 91.5 fL (ref 80.0–100.0)
Platelets: 178 10*3/uL (ref 150–400)
RBC: 3.78 MIL/uL — ABNORMAL LOW (ref 4.22–5.81)
RDW: 11.9 % (ref 11.5–15.5)
WBC: 6.4 10*3/uL (ref 4.0–10.5)
nRBC: 0 % (ref 0.0–0.2)

## 2019-04-19 NOTE — Progress Notes (Signed)
Inpatient Rehab Admissions:  Inpatient Rehab Consult received.  I met with patient at the bedside for rehabilitation assessment and to discuss goals and expectations of an inpatient rehab admission.  He does not make any verbalizations or head nods.  Does maintain eye contact but does not appear to follow commands.  Will need to speak with daughter for further assessment and to determine pt's insurance.  I left her a message.   Signed: Shann Medal, PT, DPT Admissions Coordinator [phone] 04/19/19  12:39 PM

## 2019-04-19 NOTE — Progress Notes (Signed)
Physical Therapy Treatment Patient Details Name: Scott Gilbert MRN: [id number] DOB: [birth year] Today's Date: 04/19/2019    History of Present Illness 86 y.o. male with past medical history significant for hypertension and mild dementia. He presented to the emergency department with sudden onset of slurred speech and aphasia, facial droop and weakness.   CT cerebral perfusion on 04/16/19 reported: "acute distal Left ACA territory ischemia and infarct."    PT Comments    Pt presenting with increased tone RUE/LE as compared to eval. He required min to mod assist to maintain EOB sitting balance. +2 mod/max assist for transfers. He responds to tactile and visual cues. Global aphasia. Better focus and participation with minimized distractions. Pt very pleasant, cooperative and smiling. No verbalizations other than occasional laughing.    Follow Up Recommendations  CIR;Supervision/Assistance - 24 hour     Equipment Recommendations  Other (comment)(TBD)    Recommendations for Other Services       Precautions / Restrictions Precautions Precautions: Fall    Mobility  Bed Mobility Overal bed mobility: Needs Assistance             General bed mobility comments: Sitting EOB with OT on arrival.  Transfers Overall transfer level: Needs assistance Equipment used: 2 person hand held assist Transfers: Sit to/from Stand;Stand Pivot Transfers Sit to Stand: +2 physical assistance;Mod assist Stand pivot transfers: +2 physical assistance;Mod assist;Max assist       General transfer comment: sit to stand x 2 trials. Pivot transfer toward right. Increased assist due to pivoting to involved side. Decreased weight shift over right. OT blocking R knee.  Ambulation/Gait                 Stairs             Wheelchair Mobility    Modified Rankin (Stroke Patients Only) Modified Rankin (Stroke Patients Only) Pre-Morbid Rankin Score: No symptoms Modified Rankin: Severe  disability     Balance Overall balance assessment: Needs assistance Sitting-balance support: No upper extremity supported;Feet supported Sitting balance-Leahy Scale: Poor Sitting balance - Comments: min/mod assist to maintain sitting balance   Standing balance support: Bilateral upper extremity supported;During functional activity Standing balance-Leahy Scale: Poor Standing balance comment: reliant on external support                            Cognition Arousal/Alertness: Awake/alert Behavior During Therapy: WFL for tasks assessed/performed Overall Cognitive Status: Difficult to assess                                 General Comments: Global aphasia. Pleasant and smiling but no verbalizations. Following simple commands inconsistently when distractions are minimized. Receptive to tactile and visual cues.      Exercises      General Comments        Pertinent Vitals/Pain Pain Assessment: Faces Faces Pain Scale: Hurts a little bit Pain Location: genralized with mobility, ROM RLE Pain Descriptors / Indicators: Discomfort;Grimacing Pain Intervention(s): Monitored during session;Repositioned    Home Living                      Prior Function            PT Goals (current goals can now be found in the care plan section) Acute Rehab PT Goals Patient Stated Goal: unable to state    Frequency  Min 4X/week      PT Plan Current plan remains appropriate    Co-evaluation PT/OT/SLP Co-Evaluation/Treatment: Yes Reason for Co-Treatment: Complexity of the patient's impairments (multi-system involvement);For patient/therapist safety;Necessary to address cognition/behavior during functional activity PT goals addressed during session: Mobility/safety with mobility;Balance        AM-PAC PT "6 Clicks" Mobility   Outcome Measure  Help needed turning from your back to your side while in a flat bed without using bedrails?: A Lot Help  needed moving from lying on your back to sitting on the side of a flat bed without using bedrails?: A Lot Help needed moving to and from a bed to a chair (including a wheelchair)?: A Lot Help needed standing up from a chair using your arms (e.g., wheelchair or bedside chair)?: A Lot Help needed to walk in hospital room?: Total Help needed climbing 3-5 steps with a railing? : Total 6 Click Score: 10    End of Session Equipment Utilized During Treatment: Gait belt Activity Tolerance: Patient tolerated treatment well Patient left: in chair;with chair alarm set;with call bell/phone within reach Nurse Communication: Mobility status PT Visit Diagnosis: Other abnormalities of gait and mobility (R26.89);Hemiplegia and hemiparesis Hemiplegia - Right/Left: Right Hemiplegia - caused by: Cerebral infarction     Time: 5056-9794 PT Time Calculation (min) (ACUTE ONLY): 23 min  Charges:  $Therapeutic Activity: 8-22 mins                     Aida Raider, PT  Office # [phone] Pager [phone]    Ilda Foil 04/19/2019, 10:16 AM

## 2019-04-19 NOTE — Progress Notes (Signed)
  Speech Language Pathology Treatment: Dysphagia;Cognitive-Linquistic  Patient Details Name: Scott Gilbert MRN: [id number] DOB: Jul 13, 1929 Today's Date: 04/19/2019 Time: 1040-1110 SLP Time Calculation (min) (ACUTE ONLY): 30 min  Assessment / Plan / Recommendation Clinical Impression  Patient seen at bedside for skilled ST targeting dysphagia and aphasia.  Patient continues to present with global aphasia, with expressive > receptive deficits. SLP unable to elicit any imitation of verbalizations during session. Pt noted to laugh occasionally (voicing intact). PT unable to engage in repetition tasks, automatic speech tasks or naming tasks despite max visual and verbal cues. He did not imitate open vowel sounds or short words. ST attempted MIT, patient with no response. Some lingual pumping noted. Pt did visually track clinician. Pt did not answer basic yes/no questions using any modality. ST attempted to elicit yes/no responses using a multi-modal approach, unable to obtain a sufficient response.  Patient was able to follow 30% of basic motor 1-step commands ("close eyes," "open mouth" and so forth).   Patient seen with thin liquid via cup and straw sips. Pt with some difficulty orally grasping straw, but able to do so and siphon liquid. No overt s/sx aspiration seen w/ thin liquids. Pt seen with puree solids: pt with some discoordinated mastication/bolus formation. Noted to have some lingual pumping with and without puree bolus present. Pt with slightly prolonged AP transit, able to initiate the swallow, no overt s/sx aspiration and adequate oral clearance achieved. Recommend patient continue dysphagia 1 solids/ thin liquids with full supervision to assist with feeding. Recommend small bites/sips, upright positioning, slow rate of intake and oral care BID.  ST removed upper dentures and left in denture cup (to soak, with cleaner) at end of session. ST to follow as per POC.   HPI HPI: Scott Gilbert is a 86 y.o. male with past medical history significant for hypertension, mild dementia presented to the emergency department with sudden onset of slurred speech and aphasia, facial droop and weakness.   CT cerebral perfusion on 04/16/19 reported: "acute distal Left ACA territory ischemia and infarct."       SLP Plan  Continue with current plan of care       Recommendations  Diet recommendations: Dysphagia 1 (puree);Thin liquid Medication Administration: Crushed with puree Supervision: Full supervision/cueing for compensatory strategies Compensations: Minimize environmental distractions;Slow rate;Small sips/bites Postural Changes and/or Swallow Maneuvers: Seated upright 90 degrees                Oral Care Recommendations: Oral care BID;Staff/trained caregiver to provide oral care Follow up Recommendations: Skilled Nursing facility SLP Visit Diagnosis: Aphasia (R47.01);Dysphagia, unspecified (R13.10) Plan: Continue with current plan of care       GO               Shella Spearing, M.Ed., CCC-SLP Speech Therapy Acute Rehabilitation [phone]: Acute Rehab office [phone] - pager   Shella Spearing 04/19/2019, 12:35 PM

## 2019-04-19 NOTE — Progress Notes (Signed)
Inpatient Rehab Admissions Coordinator:   Was able to speak with pt's daughter, Steward Drone.  [facility] has [facility] but does not have [facility]are.  [facility] does not contract with [facility].  Will let TOC know.   Estill Dooms, PT, DPT Admissions Coordinator [phone] 04/19/19  4:14 PM

## 2019-04-19 NOTE — Progress Notes (Signed)
Occupational Therapy Treatment Patient Details Name: Scott Gilbert MRN: [id number] DOB: 05-31-1929 Today's Date: 04/19/2019    History of present illness 86 y.o. male with past medical history significant for hypertension and mild dementia. He presented to the emergency department with sudden onset of slurred speech and aphasia, facial droop and weakness.   CT cerebral perfusion on 04/16/19 reported: "acute distal Left ACA territory ischemia and infarct."   OT comments  Pt received sitting in bed with resting hand splint on his RUE, skin integrity intact, pleasant and cooperative throughout session. Pt required modA for bed mobility, responding better to visual and tactile cues as opposed to verbal cues. He followed simple commands inconsistently <25% of the time. While sitting EOB he required minA-modA for sitting balance. He required modA+2-maxA+2 for stand pivot with R knee blocked. Therapist presented pt with a dry toothbrush and he brought it to his mouth, he required maxA for coordinated movements of LUE to brush teeth. Pt will continue to benefit from skilled OT services to maximize safety and independence with ADL/IADL and functional mobility. Will continue to follow acutely and progress as tolerated.     Follow Up Recommendations  CIR;Supervision/Assistance - 24 hour    Equipment Recommendations  3 in 1 bedside commode    Recommendations for Other Services      Precautions / Restrictions Precautions Precautions: Fall       Mobility Bed Mobility Overal bed mobility: Needs Assistance Bed Mobility: Supine to Sit     Supine to sit: Mod assist;HOB elevated     General bed mobility comments: modA for all aspects  Transfers Overall transfer level: Needs assistance Equipment used: 2 person hand held assist Transfers: Sit to/from [facility] Sit to Stand: +2 physical assistance;Mod assist Stand pivot transfers: +2 physical assistance;Mod assist;Max assist        General transfer comment: sit to stand x 2 trials. Pivot transfer toward right. Increased assist due to pivoting to involved side. Decreased weight shift over right. OT blocking R knee.    Balance Overall balance assessment: Needs assistance Sitting-balance support: Single extremity supported;Feet supported Sitting balance-Leahy Scale: Poor Sitting balance - Comments: min/mod assist to maintain sitting balance   Standing balance support: Bilateral upper extremity supported;During functional activity Standing balance-Leahy Scale: Poor Standing balance comment: reliant on external support                           ADL either performed or assessed with clinical judgement   ADL Overall ADL's : Needs assistance/impaired     Grooming: Maximal assistance Grooming Details (indicate cue type and reason): maxA to clean hands;brough toothbrush to mouth, maxA for coordinated movements to brush teeth     Lower Body Bathing: Maximal assistance;+2 for physical assistance;+2 for safety/equipment;Sit to/from stand Lower Body Bathing Details (indicate cue type and reason): maxA for sit<>stand pt attempting to wipe beads from mitts off legs         Toilet Transfer: Moderate assistance;+2 for physical assistance;+2 for safety/equipment;Stand-pivot Toilet Transfer Details (indicate cue type and reason): stand pivot toward R (due to limited space in room);modA+2 for support in standing, R knee block and assistance pivoting feet         Functional mobility during ADLs: Moderate assistance;+2 for physical assistance;+2 for safety/equipment;Cueing for safety;Cueing for sequencing General ADL Comments: highly distractable during session, able to follow simple one step commands inconsistently, responded better to visual and tactile cues. limited by global  aphasia     Vision   Vision Assessment?: Vision impaired- to be further tested in functional context Additional Comments: need to  continue to assess, difficult this session as pt was easily distracted   Perception     Praxis      Cognition Arousal/Alertness: Awake/alert Behavior During Therapy: WFL for tasks assessed/performed Overall Cognitive Status: Difficult to assess                                 General Comments: Global aphasia. Pleasant and smiling but no verbalizations. Following simple commands inconsistently when distractions are minimized. Receptive to tactile and visual cues.when presented with toothbrush, brought it to mouth        Exercises     Shoulder Instructions       General Comments vss;educated staff on proper wear schedule for resting hand spling and importance of frequent skin checks    Pertinent Vitals/ Pain       Pain Assessment: Faces Faces Pain Scale: No hurt Pain Location: genralized with mobility, ROM RLE Pain Descriptors / Indicators: Discomfort;Grimacing Pain Intervention(s): Monitored during session  Home Living                                          Prior Functioning/Environment              Frequency  Min 2X/week        Progress Toward Goals  OT Goals(current goals can now be found in the care plan section)  Progress towards OT goals: Progressing toward goals  Acute Rehab OT Goals Patient Stated Goal: unable to state OT Goal Formulation: Patient unable to participate in goal setting Time For Goal Achievement: 05/02/19 Potential to Achieve Goals: Good ADL Goals Pt Will Perform Grooming: with min assist;sitting Pt Will Perform Upper Body Dressing: with min assist;sitting Pt Will Transfer to Toilet: with min assist;stand pivot transfer;bedside commode Pt Will Perform Toileting - Clothing Manipulation and hygiene: with min assist;sitting/lateral leans;sit to/from stand Pt/caregiver will Perform Home Exercise Program: Increased strength;Right Upper extremity;Increased ROM;With minimal assist Additional ADL Goal #1: Pt  will participate in x3 mins of ADL task with moderate cueing to attend.  Plan Discharge plan remains appropriate    Co-evaluation    PT/OT/SLP Co-Evaluation/Treatment: Yes Reason for Co-Treatment: Complexity of the patient's impairments (multi-system involvement);For patient/therapist safety;To address functional/ADL transfers PT goals addressed during session: Mobility/safety with mobility;Balance OT goals addressed during session: ADL's and self-care      AM-PAC OT "6 Clicks" Daily Activity     Outcome Measure   Help from another person eating meals?: A Lot Help from another person taking care of personal grooming?: A Lot Help from another person toileting, which includes using toliet, bedpan, or urinal?: A Lot Help from another person bathing (including washing, rinsing, drying)?: A Lot Help from another person to put on and taking off regular upper body clothing?: A Lot Help from another person to put on and taking off regular lower body clothing?: A Lot 6 Click Score: 12    End of Session Equipment Utilized During Treatment: Gait belt  OT Visit Diagnosis: Unsteadiness on feet (R26.81);Muscle weakness (generalized) (M62.81)   Activity Tolerance Patient tolerated treatment well   Patient Left in chair;with call bell/phone within reach;with chair alarm set   Nurse Communication Mobility status  Time: 4287-6811 OT Time Calculation (min): 30 min  Charges: OT General Charges $OT Visit: 1 Visit OT Treatments $Self Care/Home Management : 8-22 mins  Diona Browner OTR/L Acute Rehabilitation Services Office: [phone]    Rebeca Alert 04/19/2019, 11:41 AM

## 2019-04-19 NOTE — Progress Notes (Signed)
Bilateral lower extremity venous duplex exam completed.  Preliminary results can be found under CV proc under chart review.  04/19/2019 11:40 AM  Marya Lowden, K., RDMS, RVT

## 2019-04-19 NOTE — Progress Notes (Signed)
STROKE TEAM PROGRESS NOTE   INTERVAL HISTORY His RN is at the bedside.  Pt sitting in chair,   has global aphasia and right hemiparesis. Await rehab assessment. Vitals stable/ LE venous dopplers negative for DVT as per prelim report.  OBJECTIVE Vitals:   04/18/19 2008 04/18/19 2349 04/19/19 0327 04/19/19 0826  BP: (!) 166/63 (!) 177/72 (!) 152/63 (!) 152/72  Pulse: 78 92 65 74  Resp: 18 18 18 16   Temp: 97.9 F (36.6 C) 98.5 F (36.9 C) 97.8 F (36.6 C) 97.9 F (36.6 C)  TempSrc: Oral Oral Oral Oral  SpO2: 96% 95% 99% 98%  Weight:        CBC:  Recent Labs  Lab 04/16/19 2219 04/16/19 2219 04/18/19 0231 04/19/19 0149  WBC 7.2   < > 9.1 6.4  NEUTROABS 5.1  --   --   --   HGB 14.0   < > 13.2 11.9*  HCT 40.9   < > 38.3* 34.6*  MCV 93.4   < > 92.5 91.5  PLT 196   < > 175 178   < > = values in this interval not displayed.    Basic Metabolic Panel:  Recent Labs  Lab 04/18/19 0231 04/19/19 0149  NA 138 138  K 3.8 3.7  CL 106 106  CO2 23 23  GLUCOSE 110* 152*  BUN 17 21  CREATININE 1.19 1.46*  CALCIUM 8.7* 8.5*    Lipid Panel:     Component Value Date/Time   CHOL 167 04/17/2019 0300   TRIG 82 04/17/2019 0300   HDL 38 (L) 04/17/2019 0300   CHOLHDL 4.4 04/17/2019 0300   VLDL 16 04/17/2019 0300   LDLCALC 113 (H) 04/17/2019 0300   HgbA1c:  Lab Results  Component Value Date   HGBA1C 5.3 04/17/2019   Urine Drug Screen:     Component Value Date/Time   LABOPIA NONE DETECTED 04/17/2019 0040   COCAINSCRNUR NONE DETECTED 04/17/2019 0040   LABBENZ NONE DETECTED 04/17/2019 0040   AMPHETMU NONE DETECTED 04/17/2019 0040   THCU NONE DETECTED 04/17/2019 0040   LABBARB NONE DETECTED 04/17/2019 0040    Alcohol Level     Component Value Date/Time   ETH <10 04/16/2019 2214    IMAGING  CT ANGIO HEAD W OR WO CONTRAST CT ANGIO NECK W OR WO CONTRAST 04/16/2019 IMPRESSION:  1. Negative for emergent large vessel occlusion, but positive for widespread severe  intracranial atherosclerosis.  2. Multifocal large and medium size vessel stenoses in the anterior and posterior circulation, most notable for: - severe distal Left ICA siphon stenosis due to bulky calcified plaque. - multifocal Left MCA irregularity and stenoses. - up to severe stenoses of both distal Vertebral Arteries, and the Left PCA. - bilateral MCA M3, and Left ACA A2 severe stenoses.  3. But relatively little atherosclerosis in the neck with no significant cervical vertebral or carotid stenosis.  4.  Aortic Atherosclerosis (ICD10-I70.0).  5. Post granulomatous changes in the upper chest including calcified pleural plaques, pulmonary granulomas and mediastinal lymph nodes.   CT CEREBRAL PERFUSION W CONTRAST 04/16/2019 IMPRESSION:  CT Perfusion indicates acute distal Left ACA territory ischemia and infarct. This likely corresponds to the severe left distal A2 arterial stenosis (versus occlusion) seen by CTA.    CT HEAD CODE STROKE WO CONTRAST 04/16/2019 IMPRESSION: 1. No acute intracranial abnormality  2. ASPECTS is 10.  3. Advanced age-related cerebral atrophy with chronic small vessel ischemic disease, with multiple remote lacunar infarcts involving the bilateral  basal ganglia, with chronic left cerebellar infarcts.   MRI Head WO Contrast 04/17/2019 IMPRESSION: 1. Moderate-sized acute ischemic nonhemorrhagic left ACA territory infarct without associated mass effect. 2. Moderate to advanced age-related cerebral atrophy with chronicvsmall vessel ischemic disease and multiple remote lacunar infarctsvabout the bilateral basal ganglia, with additional chronic leftvcerebellar infarcts.  Transthoracic Echocardiogram  04/17/2019 IMPRESSIONS  1. Left ventricular ejection fraction, by visual estimation, is 70 to 75%. The left ventricle has hyperdynamic function. There is no left ventricular hypertrophy.  2. Left ventricular diastolic parameters are consistent with Grade I diastolic  dysfunction (impaired relaxation).  3. The left ventricle has no regional wall motion abnormalities.  4. Global right ventricle has normal systolic function.The right ventricular size is normal. No increase in right ventricular wall thickness.  5. Left atrial size was normal.  6. Right atrial size was normal.  7. The mitral valve is normal in structure. Mild mitral valve  regurgitation. No evidence of mitral stenosis.  8. The tricuspid valve is normal in structure.  9. The tricuspid valve is normal in structure. Tricuspid valve  regurgitation is mild-moderate.  10. The aortic valve is normal in structure. Aortic valve regurgitation is trivial. No evidence of aortic valve sclerosis or stenosis.  11. The pulmonic valve was normal in structure. Pulmonic valve regurgitation is trivial.  12. Mildly elevated pulmonary artery systolic pressure.  13. The tricuspid regurgitant velocity is 2.64 m/s, and with an assumed right atrial pressure of 3 mmHg, the estimated right ventricular systolic pressure is mildly elevated at 30.9 mmHg.  14. The inferior vena cava is normal in size with greater than 50% respiratory variability, suggesting right atrial pressure of 3 mmHg.  15. No intracardiac source of embolism detected on this transthoracic study. A transesophageal echocardiogram is recommended to exclude cardiac source of embolism if clinically indicated.   ECG - SR rate 72 BPM. (See cardiology reading for complete details)  EEG  04/17/19 IMPRESSION: This study is suggestive of cortical dysfunction in left frontotemporal region, likely secondary to underlying structural abnormality. No seizures or definite epileptiform discharges were seen throughout the recording.   PHYSICAL EXAM    Temp:  [97.8 F (36.6 C)-98.5 F (36.9 C)] 97.9 F (36.6 C) (02/01 0826) Pulse Rate:  [65-92] 74 (02/01 0826) Resp:  [16-18] 16 (02/01 0826) BP: (142-177)/(58-72) 152/72 (02/01 0826) SpO2:  [95 %-99 %] 98 %  (02/01 0826)  General - Well nourished, well developed elderly caucasian male, in no apparent distress.  Bruising around right knee and significant ecchymosis on left arm around IV site  Ophthalmologic - fundi not visualized due to noncooperation.  Cardiovascular - Regular rhythm and rate.  Neuro - awake alert, making eye contact, tracking bilaterally.  Global aphasia, did   follow only simple midline and pantomime commands on left side, no speech output.  No gaze preference, blinking to visual threat bilaterally. Right facial droop.  Tongue midline hemolysis.  Left upper extremity at least 3/5, left lower extremity at least 3-/5.  Right upper and lower extremity mild withdrawal on pain stimulation.  DTR 1+, no Babinski. Sensation, coordination and gait not tested.    ASSESSMENT/PLAN [name] is a 86 y.o. male with history of hypertension and  mild dementia (medical care at [facility]), presents to the emergency department with sudden onset of slurred speech, aphasia, left gaze preference with right-sided neglect, right hemiparesis, facial droop and weakness. He received IV t-PA Friday 04/16/19 at 2230.  Stroke: Left ACA territory infarct, likely due  to large vessel disease although cardioembolic source not excluded  Code Stroke CT Head - No acute intracranial abnormality, multiple remote lacunar infarcts involving the bilateral basal ganglia, with chronic left cerebellar infarcts.   MRI head - Moderate-sized acute ischemic nonhemorrhagic left ACA territory infarct  CTA H&N - severe distal Left ICA siphon stenosis due to bulky calcified plaque, multifocal Left MCA irregularity and stenoses, up to severe stenoses of both distal VAs, and the Left PCA. bilateral MCA M3, and Left ACA A2 severe stenoses.   EEG - cortical dysfunction in left frontotemporal region, no seizures.  2D Echo - EF 70-75%. No intracardiac source of embolism   LE venous doppler  Negative for DVT   Consider 30 day  cardiac event monitoring as outpt  [employer] Virus 2 - negative  LDL - 113  HgbA1c - 5.3  UDS - negative  VTE prophylaxis - lovenox  No antithrombotic prior to admission, now on ASA 325 and plavix DAPT for 3 months and then ASA alone given significant intracranial stenosis  Ongoing aggressive stroke risk factor management  Therapy recommendations: CIR  Disposition:  Pending  Medical stable for transfer to rehab when bed available  Hypertension  Home BP meds: Zestril  Current BP meds: none   BP stable . Permissive hypertension (OK if < 180/105) but gradually normalize in 5-7 days  . Long-term BP goal normotensive  Hyperlipidemia  Home Lipid lowering medication: none  LDL 113, goal < 70  Current lipid lowering medication: on lipitor 40  Continue statin at discharge  Other Stroke Risk Factors  Advanced age  Hx stroke/TIA - by imaging  Other Active Problems  CKD - stage 3a, Cre 1.50->1.37->1.19  Dementia - on galantamine and Risperdal   Fall - right knee bruise   Hospital day # 3  I have personally obtained history,examined this patient, reviewed notes, independently viewed imaging studies, participated in medical decision making and plan of care.ROS completed by me personally and pertinent positives fully documented  I have made any additions or clarifications directly to the above note. Continue aspirin and plavix for 3 months and then aspirin alone. . I have spent a total of  25  minutes with the patient reviewing hospital notes,  test results, labs and examining the patient as well as establishing an assessment and plan that was discussed personally with the patient.  > 50% of time was spent in direct patient care.       [name], MD Medical Director [facility] St[facility] Pager: [phone] 04/19/2019 1:32 PM   To contact Stroke Continuity provider, please refer to [email or web address]. After hours, contact General Neurology

## 2019-04-20 LAB — BASIC METABOLIC PANEL
Anion gap: 12 (ref 5–15)
BUN: 21 mg/dL (ref 8–23)
CO2: 19 mmol/L — ABNORMAL LOW (ref 22–32)
Calcium: 8.4 mg/dL — ABNORMAL LOW (ref 8.9–10.3)
Chloride: 102 mmol/L (ref 98–111)
Creatinine, Ser: 1.39 mg/dL — ABNORMAL HIGH (ref 0.61–1.24)
GFR calc Af Amer: 52 mL/min — ABNORMAL LOW (ref >60)
GFR calc non Af Amer: 45 mL/min — ABNORMAL LOW (ref >60)
Glucose, Bld: 103 mg/dL — ABNORMAL HIGH (ref 70–99)
Potassium: 4.8 mmol/L (ref 3.5–5.1)
Sodium: 133 mmol/L — ABNORMAL LOW (ref 135–145)

## 2019-04-20 LAB — CBC
HCT: 40.5 % (ref 39.0–52.0)
Hemoglobin: 13.8 g/dL (ref 13.0–17.0)
MCH: 31.5 pg (ref 26.0–34.0)
MCHC: 34.1 g/dL (ref 30.0–36.0)
MCV: 92.5 fL (ref 80.0–100.0)
Platelets: 186 10*3/uL (ref 150–400)
RBC: 4.38 MIL/uL (ref 4.22–5.81)
RDW: 11.9 % (ref 11.5–15.5)
WBC: 10.1 10*3/uL (ref 4.0–10.5)
nRBC: 0 % (ref 0.0–0.2)

## 2019-04-20 LAB — GLUCOSE, CAPILLARY: Glucose-Capillary: 106 mg/dL — ABNORMAL HIGH (ref 70–99)

## 2019-04-20 NOTE — Progress Notes (Signed)
Physical Therapy Treatment Patient Details Name: Scott Gilbert MRN: [id number] DOB: 1930/02/06 Today's Date: 04/20/2019    History of Present Illness 86 y.o. male with past medical history significant for hypertension and mild dementia. He presented to the emergency department with sudden onset of slurred speech and aphasia, facial droop and weakness.   CT cerebral perfusion on 04/16/19 reported: "acute distal Left ACA territory ischemia and infarct."    PT Comments    Pt participatory with treatment even though he is not verbal, often smiles or laughs. Mod A for bed mobility. Frequent need for mod A sitting EOB due to R lean. Pt transferred to chair with max A +2. Stood from Medical illustrator with mod A +2 for safety and maintained standing for 1 min before R knee buckling. CIR level therapies would be ideal for pt at d/c but if this cannot happen, recommend SNF.    Follow Up Recommendations  CIR;Supervision/Assistance - 24 hour     Equipment Recommendations  Other (comment)(TBD)    Recommendations for Other Services Rehab consult     Precautions / Restrictions Precautions Precautions: Fall Restrictions Weight Bearing Restrictions: No    Mobility  Bed Mobility Overal bed mobility: Needs Assistance Bed Mobility: Supine to Sit     Supine to sit: Mod assist;HOB elevated     General bed mobility comments: modA for all aspects  Transfers Overall transfer level: Needs assistance Equipment used: 2 person hand held assist Transfers: Sit to/from [employer] Sit to Stand: +2 physical assistance;Mod assist Stand pivot transfers: +2 physical assistance;Max assist       General transfer comment: pt pivoted to L from bed to chair, able to reach for arm rest with L hand. Max A for wt shift. Practiced sit<>stand from chair with mod A  Ambulation/Gait                 Stairs             Wheelchair Mobility    Modified Rankin (Stroke Patients  Only) Modified Rankin (Stroke Patients Only) Pre-Morbid Rankin Score: No symptoms Modified Rankin: Severe disability     Balance Overall balance assessment: Needs assistance Sitting-balance support: Single extremity supported;Feet supported Sitting balance-Leahy Scale: Poor Sitting balance - Comments: min/mod assist to maintain sitting balance, occasional LOB to R   Standing balance support: Bilateral upper extremity supported;During functional activity Standing balance-Leahy Scale: Poor Standing balance comment: reliant on external support and max A. Maintained standing x1 min with wt shifting and then pt's R knee buckled and he sat back down                            Cognition Arousal/Alertness: Awake/alert Behavior During Therapy: WFL for tasks assessed/performed Overall Cognitive Status: Difficult to assess                                 General Comments: global aphasia. Did not attempt to verbalize but occasionally smiles or laughs. Followed simple, 1 step commands ~25% of time      Exercises      General Comments General comments (skin integrity, edema, etc.): resting hand splint donned on R      Pertinent Vitals/Pain Pain Assessment: Faces Faces Pain Scale: No hurt    Home Living  Prior Function            PT Goals (current goals can now be found in the care plan section) Acute Rehab PT Goals Patient Stated Goal: unable to state PT Goal Formulation: Patient unable to participate in goal setting Time For Goal Achievement: 05/02/19 Potential to Achieve Goals: Good Progress towards PT goals: Progressing toward goals    Frequency    Min 4X/week      PT Plan Current plan remains appropriate    Co-evaluation              AM-PAC PT "6 Clicks" Mobility   Outcome Measure  Help needed turning from your back to your side while in a flat bed without using bedrails?: A Lot Help needed moving  from lying on your back to sitting on the side of a flat bed without using bedrails?: A Lot Help needed moving to and from a bed to a chair (including a wheelchair)?: A Lot Help needed standing up from a chair using your arms (e.g., wheelchair or bedside chair)?: A Lot Help needed to walk in hospital room?: Total Help needed climbing 3-5 steps with a railing? : Total 6 Click Score: 10    End of Session Equipment Utilized During Treatment: Gait belt Activity Tolerance: Patient tolerated treatment well Patient left: in chair;with chair alarm set;with call bell/phone within reach Nurse Communication: Mobility status PT Visit Diagnosis: Other abnormalities of gait and mobility (R26.89);Hemiplegia and hemiparesis Hemiplegia - Right/Left: Right Hemiplegia - caused by: Cerebral infarction     Time: 0623-7628 PT Time Calculation (min) (ACUTE ONLY): 20 min  Charges:  $Therapeutic Activity: 8-22 mins                     Leighton Roach, PT  Acute Rehab Services  Pager [phone] Office [place] 04/20/2019, 1:24 PM

## 2019-04-20 NOTE — TOC Initial Note (Signed)
Transition of Care [facility]) - Initial/Assessment Note    Patient Details  Name: Scott Gilbert MRN: [id number] Date of Birth: June 18, 1929  [facility] CM/SW Contact:    Terrilee Croak, Student-Social Work Phone Number: 04/20/2019, 10:57 AM  Clinical Narrative:                 MSW Intern reached out to PT daughter, Steward Drone, to touch base about discharge placement. Steward Drone was advised that it is recommended that PT get rehab before returning home. Steward Drone noted that she had just found out that the [place] would not pay for SNF placement, but would pay for [facility].  She stated that she was working on figuring out if the PT had medicaid, but was unsure. If authorization cannot be obtained, she was agreeable to [facility] and said the [place] would pay for DME as well. She said she will follow up with SW when she had more information. SW will continue to follow.  Expected Discharge Plan: IP Rehab Facility Barriers to Discharge: Continued Medical Work up, Inadequate or no insurance   Patient Goals and CMS Choice Patient states their goals for this hospitalization and ongoing recovery are:: Pt unable to participate in goal setting due to disorientation. CMS Medicare.gov Compare Post Acute Care list provided to:: Patient Represenative (must comment)(Brenda, daughter) Choice offered to / list presented to : Adult Children  Expected Discharge Plan and Services Expected Discharge Plan: IP Rehab Facility       Living arrangements for the past 2 months: Single Family Home                                      Prior Living Arrangements/Services Living arrangements for the past 2 months: Single Family Home Lives with:: Adult Children Patient language and need for interpreter reviewed:: Yes Do you feel safe going back to the place where you live?: Yes      Need for Family Participation in Patient Care: Yes (Comment) Care giver support system in place?: Yes (comment)   Criminal Activity/Legal  Involvement Pertinent to Current Situation/Hospitalization: No - Comment as needed  Activities of Daily Living      Permission Sought/Granted Permission sought to share information with : Facility Industrial/product designer granted to share information with : Yes, Verbal Permission Granted  Share Information with NAME: Syair Fricker     Permission granted to share info w Relationship: Daughter  Permission granted to share info w Contact Information: [phone]  Emotional Assessment Appearance:: Other (Comment Required(Unable to Assess) Attitude/Demeanor/Rapport: Unable to Assess Affect (typically observed): Unable to Assess Orientation: : (Disoriented x 4) Alcohol / Substance Use: Not Applicable Psych Involvement: No (comment)  Admission diagnosis:  Stroke (cerebrum) [facility]) [I63.9] Patient Active Problem List   Diagnosis Date Noted  . Stroke (cerebrum) (HCC) 04/16/2019   PCP:  [facility]:  No Pharmacies Listed    Social Determinants of Health (SDOH) Interventions    Readmission Risk Interventions No flowsheet data found.

## 2019-04-20 NOTE — Progress Notes (Addendum)
STROKE TEAM PROGRESS NOTE   INTERVAL HISTORY    Pt sitting in chair,  Still has global aphasia and right hemiparesis. Await rehab transfert. Vitals stable . Vitals stable. No changes.CBC and BMP unremarkable  OBJECTIVE Vitals:   04/19/19 2321 04/20/19 0441 04/20/19 0815 04/20/19 1107  BP: (!) 143/57  (!) 177/67 (!) 166/94  Pulse: 78 75 (!) 107 95  Resp: 18  20 20   Temp: 99.4 F (37.4 C)  98.7 F (37.1 C) 98.4 F (36.9 C)  TempSrc: Oral  Oral Oral  SpO2: 95% 98% 98% 98%  Weight:        CBC:  Recent Labs  Lab 04/16/19 2219 04/18/19 0231 04/19/19 0149 04/20/19 0530  WBC 7.2   < > 6.4 10.1  NEUTROABS 5.1  --   --   --   HGB 14.0   < > 11.9* 13.8  HCT 40.9   < > 34.6* 40.5  MCV 93.4   < > 91.5 92.5  PLT 196   < > 178 186   < > = values in this interval not displayed.    Basic Metabolic Panel:  Recent Labs  Lab 04/19/19 0149 04/20/19 0530  NA 138 133*  K 3.7 4.8  CL 106 102  CO2 23 19*  GLUCOSE 152* 103*  BUN 21 21  CREATININE 1.46* 1.39*  CALCIUM 8.5* 8.4*    Lipid Panel:     Component Value Date/Time   CHOL 167 04/17/2019 0300   TRIG 82 04/17/2019 0300   HDL 38 (L) 04/17/2019 0300   CHOLHDL 4.4 04/17/2019 0300   VLDL 16 04/17/2019 0300   LDLCALC 113 (H) 04/17/2019 0300   HgbA1c:  Lab Results  Component Value Date   HGBA1C 5.3 04/17/2019   Urine Drug Screen:     Component Value Date/Time   LABOPIA NONE DETECTED 04/17/2019 0040   COCAINSCRNUR NONE DETECTED 04/17/2019 0040   LABBENZ NONE DETECTED 04/17/2019 0040   AMPHETMU NONE DETECTED 04/17/2019 0040   THCU NONE DETECTED 04/17/2019 0040   LABBARB NONE DETECTED 04/17/2019 0040    Alcohol Level     Component Value Date/Time   ETH <10 04/16/2019 2214    IMAGING  CT ANGIO HEAD W OR WO CONTRAST CT ANGIO NECK W OR WO CONTRAST 04/16/2019 IMPRESSION:  1. Negative for emergent large vessel occlusion, but positive for widespread severe intracranial atherosclerosis.  2. Multifocal large and  medium size vessel stenoses in the anterior and posterior circulation, most notable for: - severe distal Left ICA siphon stenosis due to bulky calcified plaque. - multifocal Left MCA irregularity and stenoses. - up to severe stenoses of both distal Vertebral Arteries, and the Left PCA. - bilateral MCA M3, and Left ACA A2 severe stenoses.  3. But relatively little atherosclerosis in the neck with no significant cervical vertebral or carotid stenosis.  4.  Aortic Atherosclerosis (ICD10-I70.0).  5. Post granulomatous changes in the upper chest including calcified pleural plaques, pulmonary granulomas and mediastinal lymph nodes.   CT CEREBRAL PERFUSION W CONTRAST 04/16/2019 IMPRESSION:  CT Perfusion indicates acute distal Left ACA territory ischemia and infarct. This likely corresponds to the severe left distal A2 arterial stenosis (versus occlusion) seen by CTA.    CT HEAD CODE STROKE WO CONTRAST 04/16/2019 IMPRESSION: 1. No acute intracranial abnormality  2. ASPECTS is 10.  3. Advanced age-related cerebral atrophy with chronic small vessel ischemic disease, with multiple remote lacunar infarcts involving the bilateral basal ganglia, with chronic left cerebellar infarcts.  MRI Head WO Contrast 04/17/2019 IMPRESSION: 1. Moderate-sized acute ischemic nonhemorrhagic left ACA territory infarct without associated mass effect. 2. Moderate to advanced age-related cerebral atrophy with chronicvsmall vessel ischemic disease and multiple remote lacunar infarctsvabout the bilateral basal ganglia, with additional chronic leftvcerebellar infarcts.  Transthoracic Echocardiogram  04/17/2019 IMPRESSIONS  1. Left ventricular ejection fraction, by visual estimation, is 70 to 75%. The left ventricle has hyperdynamic function. There is no left ventricular hypertrophy.  2. Left ventricular diastolic parameters are consistent with Grade I diastolic dysfunction (impaired relaxation).  3. The left ventricle has  no regional wall motion abnormalities.  4. Global right ventricle has normal systolic function.The right ventricular size is normal. No increase in right ventricular wall thickness.  5. Left atrial size was normal.  6. Right atrial size was normal.  7. The mitral valve is normal in structure. Mild mitral valve  regurgitation. No evidence of mitral stenosis.  8. The tricuspid valve is normal in structure.  9. The tricuspid valve is normal in structure. Tricuspid valve  regurgitation is mild-moderate.  10. The aortic valve is normal in structure. Aortic valve regurgitation is trivial. No evidence of aortic valve sclerosis or stenosis.  11. The pulmonic valve was normal in structure. Pulmonic valve regurgitation is trivial.  12. Mildly elevated pulmonary artery systolic pressure.  13. The tricuspid regurgitant velocity is 2.64 m/s, and with an assumed right atrial pressure of 3 mmHg, the estimated right ventricular systolic pressure is mildly elevated at 30.9 mmHg.  14. The inferior vena cava is normal in size with greater than 50% respiratory variability, suggesting right atrial pressure of 3 mmHg.  15. No intracardiac source of embolism detected on this transthoracic study. A transesophageal echocardiogram is recommended to exclude cardiac source of embolism if clinically indicated.   ECG - SR rate 72 BPM. (See cardiology reading for complete details)  EEG  04/17/19 IMPRESSION: This study is suggestive of cortical dysfunction in left frontotemporal region, likely secondary to underlying structural abnormality. No seizures or definite epileptiform discharges were seen throughout the recording.   PHYSICAL EXAM    Temp:  [97.6 F (36.4 C)-99.4 F (37.4 C)] 98.4 F (36.9 C) (02/02 1107) Pulse Rate:  [72-107] 95 (02/02 1107) Resp:  [13-20] 20 (02/02 1107) BP: (143-177)/(55-94) 166/94 (02/02 1107) SpO2:  [95 %-98 %] 98 % (02/02 1107)  General - Well nourished, well developed elderly  caucasian male, in no apparent distress.  Bruising around right knee and significant ecchymosis on left arm around IV site  Ophthalmologic - fundi not visualized due to noncooperation.  Cardiovascular - Regular rhythm and rate.  Neuro - awake alert, making eye contact, tracking bilaterally.  Global aphasia, did   follow only simple midline and pantomime commands on left side, no speech output.  No gaze preference, blinking to visual threat bilaterally. Right facial droop.  Tongue midline hemolysis.  Left upper extremity at least 3/5, left lower extremity at least 3-/5.  Right upper and lower extremity mild withdrawal on pain stimulation.  DTR 1+, no Babinski. Sensation, coordination and gait not tested.    ASSESSMENT/PLAN [name] is a 86 y.o. male with history of hypertension and  mild dementia (medical care at [facility]), presents to the emergency department with sudden onset of slurred speech, aphasia, left gaze preference with right-sided neglect, right hemiparesis, facial droop and weakness. He received IV t-PA Friday 04/16/19 at 2230.  Stroke: Left ACA territory infarct, likely due to large vessel disease although cardioembolic source not excluded  Code Stroke CT Head - No acute intracranial abnormality, multiple remote lacunar infarcts involving the bilateral basal ganglia, with chronic left cerebellar infarcts.   MRI head - Moderate-sized acute ischemic nonhemorrhagic left ACA territory infarct  CTA H&N - severe distal Left ICA siphon stenosis due to bulky calcified plaque, multifocal Left MCA irregularity and stenoses, up to severe stenoses of both distal VAs, and the Left PCA. bilateral MCA M3, and Left ACA A2 severe stenoses.   EEG - cortical dysfunction in left frontotemporal region, no seizures.  2D Echo - EF 70-75%. No intracardiac source of embolism   LE venous doppler  Negative for DVT   Consider 30 day cardiac event monitoring as outpt  [employer] Virus 2 -  negative  LDL - 113  HgbA1c - 5.3  UDS - negative  VTE prophylaxis - lovenox  No antithrombotic prior to admission, now on ASA 325 and plavix DAPT for 3 months and then ASA alone given significant intracranial stenosis  Ongoing aggressive stroke risk factor management  Therapy recommendations: CIR  Disposition:  Pending  Medical stable for transfer to rehab when bed available  Hypertension  Home BP meds: Zestril  Current BP meds: none   BP stable . Permissive hypertension (OK if < 180/105) but gradually normalize in 5-7 days  . Long-term BP goal normotensive  Hyperlipidemia  Home Lipid lowering medication: none  LDL 113, goal < 70  Current lipid lowering medication: on lipitor 40  Continue statin at discharge  Other Stroke Risk Factors  Advanced age  Hx stroke/TIA - by imaging  Other Active Problems  CKD - stage 3a, Cre 1.50->1.37->1.19  Dementia - on galantamine and Risperdal   Fall - right knee bruise   Hospital day # 4   . Continue aspirin and plavix for 3 months and then aspirin alone. .  Await rehab transfer when bed available.      [name], MD Medical Director [facility] Pager: [phone] 04/20/2019 1:22 PM   To contact Stroke Continuity provider, please refer to [email or web address]. After hours, contact General Neurology

## 2019-04-21 LAB — BASIC METABOLIC PANEL
Anion gap: 9 (ref 5–15)
BUN: 19 mg/dL (ref 8–23)
CO2: 22 mmol/L (ref 22–32)
Calcium: 8.2 mg/dL — ABNORMAL LOW (ref 8.9–10.3)
Chloride: 107 mmol/L (ref 98–111)
Creatinine, Ser: 1.34 mg/dL — ABNORMAL HIGH (ref 0.61–1.24)
GFR calc Af Amer: 55 mL/min — ABNORMAL LOW (ref >60)
GFR calc non Af Amer: 47 mL/min — ABNORMAL LOW (ref >60)
Glucose, Bld: 135 mg/dL — ABNORMAL HIGH (ref 70–99)
Potassium: 4.1 mmol/L (ref 3.5–5.1)
Sodium: 138 mmol/L (ref 135–145)

## 2019-04-21 LAB — CBC
HCT: 35.7 % — ABNORMAL LOW (ref 39.0–52.0)
Hemoglobin: 12.1 g/dL — ABNORMAL LOW (ref 13.0–17.0)
MCH: 31.6 pg (ref 26.0–34.0)
MCHC: 33.9 g/dL (ref 30.0–36.0)
MCV: 93.2 fL (ref 80.0–100.0)
Platelets: 189 10*3/uL (ref 150–400)
RBC: 3.83 MIL/uL — ABNORMAL LOW (ref 4.22–5.81)
RDW: 12 % (ref 11.5–15.5)
WBC: 8.1 10*3/uL (ref 4.0–10.5)
nRBC: 0 % (ref 0.0–0.2)

## 2019-04-21 NOTE — Progress Notes (Signed)
Occupational Therapy Treatment Patient Details Name: Scott Gilbert MRN: [id number] DOB: [birth year] Today's Date: 04/21/2019    History of present illness 86 y.o. male with past medical history significant for hypertension and mild dementia. He presented to the emergency department with sudden onset of slurred speech and aphasia, facial droop and weakness.   CT cerebral perfusion on 04/16/19 reported: "acute distal Left ACA territory ischemia and infarct."   OT comments  Pt received in bed, pleasant and cooperative throughout today's session. Pt demonstrated progress with following simple one step commands with visual and tactile cues. He required maxA+2 for stand pivot transfer to recliner. He engaged in oral care while seated in recliner, required hand over hand assistance and max cues to complete oral care. He demonstrated improvement with coordinated brushing movements. After therapist initiated donning resting hand splint, pt assisted with applying straps correctly requiring moderate visual cues. Pt will continue to benefit from skilled OT services to maximize safety and independence with ADL/IADL and functional mobility. Will continue to follow acutely and progress as tolerated.    Follow Up Recommendations  CIR;Supervision/Assistance - 24 hour    Equipment Recommendations  3 in 1 bedside commode    Recommendations for Other Services      Precautions / Restrictions Precautions Precautions: Fall Restrictions Weight Bearing Restrictions: No       Mobility Bed Mobility Overal bed mobility: Needs Assistance Bed Mobility: Rolling;Sidelying to Sit Rolling: Max assist Sidelying to sit: Max assist       General bed mobility comments: maxA to progress BLE roll and grab bed rail and progress trunk to upright posture  Transfers Overall transfer level: Needs assistance Equipment used: 2 person hand held assist Transfers: Sit to/from [employer] Sit to Stand: Mod  assist;Max assist;+2 physical assistance;+2 safety/equipment Stand pivot transfers: Max assist;+2 physical assistance;+2 safety/equipment       General transfer comment: required knee block on bilateral knees, RLE with increased tone, decreased amount of weight bearing, causing LLE to buckle as well;    Balance Overall balance assessment: Needs assistance Sitting-balance support: Single extremity supported;Feet supported Sitting balance-Leahy Scale: Poor Sitting balance - Comments: minguard-minA for static sitting balance;frequent light tactile cues to right posture   Standing balance support: Bilateral upper extremity supported;During functional activity Standing balance-Leahy Scale: Zero Standing balance comment: reliant on external support and maxA+2 from therapists blocking bilateral knees,                            ADL either performed or assessed with clinical judgement   ADL Overall ADL's : Needs assistance/impaired     Grooming: Maximal assistance;Sitting Grooming Details (indicate cue type and reason): provided hand over hand assistance and max visual, tactile, verbal cues to open toothpaste, apply toothpaste, brush teeth;pt attempting to spit but demonstrates oral motor apraxia.         Upper Body Dressing : Maximal assistance;Sitting;Moderate assistance Upper Body Dressing Details (indicate cue type and reason): maxA to initiate donning resting hand splint;pt progressed to requiring modA to don splint, matching straps and problem solving if velcro attatched incorrectly     Toilet Transfer: Maximal assistance;+2 for physical assistance;+2 for safety/equipment;Stand-pivot Toilet Transfer Details (indicate cue type and reason): stand-pivot toward L to recliner         Functional mobility during ADLs: Maximal assistance;Moderate assistance;+2 for physical assistance;+2 for safety/equipment General ADL Comments: limited by decreased attention, global aphasia,  and apraxia  Vision   Vision Assessment?: Vision impaired- to be further tested in functional context   Perception     Praxis      Cognition Arousal/Alertness: Awake/alert Behavior During Therapy: WFL for tasks assessed/performed Overall Cognitive Status: Difficult to assess                                 General Comments: global aphasia, appears to follow very simple 1 step commands with visual and tactile cues;pt smiling during session, attempted to make sounds;required max visual and tactile cues to open container, apply toothpaste, brush teeth;washed face after 1 command with presentation of washcloth; pt assisting with donning resting hand splint with modA         Exercises     Shoulder Instructions       General Comments donned resting hand splint with RUE elevated at end of session    Pertinent Vitals/ Pain       Pain Assessment: Faces Faces Pain Scale: Hurts a little bit Pain Location: genralized with mobility, ROM RUE Pain Descriptors / Indicators: Grimacing Pain Intervention(s): Limited activity within patient's tolerance;Monitored during session  Home Living                                          Prior Functioning/Environment              Frequency  Min 2X/week        Progress Toward Goals  OT Goals(current goals can now be found in the care plan section)  Progress towards OT goals: Progressing toward goals  Acute Rehab OT Goals Patient Stated Goal: unable to state OT Goal Formulation: Patient unable to participate in goal setting Time For Goal Achievement: 05/02/19 Potential to Achieve Goals: Good ADL Goals Pt Will Perform Grooming: with min assist;sitting Pt Will Perform Upper Body Dressing: with min assist;sitting Pt Will Transfer to Toilet: with min assist;stand pivot transfer;bedside commode Pt Will Perform Toileting - Clothing Manipulation and hygiene: with min assist;sitting/lateral leans;sit  to/from stand Pt/caregiver will Perform Home Exercise Program: Increased strength;Right Upper extremity;Increased ROM;With minimal assist Additional ADL Goal #1: Pt will participate in x3 mins of ADL task with moderate cueing to attend.  Plan Discharge plan remains appropriate    Co-evaluation                 AM-PAC OT "6 Clicks" Daily Activity     Outcome Measure   Help from another person eating meals?: A Lot Help from another person taking care of personal grooming?: A Lot Help from another person toileting, which includes using toliet, bedpan, or urinal?: A Lot Help from another person bathing (including washing, rinsing, drying)?: A Lot Help from another person to put on and taking off regular upper body clothing?: A Lot Help from another person to put on and taking off regular lower body clothing?: A Lot 6 Click Score: 12    End of Session Equipment Utilized During Treatment: Gait belt  OT Visit Diagnosis: Unsteadiness on feet (R26.81);Muscle weakness (generalized) (M62.81)   Activity Tolerance Patient tolerated treatment well   Patient Left in chair;with call bell/phone within reach;with chair alarm set   Nurse Communication Mobility status        Time: 9937-1696 OT Time Calculation (min): 39 min  Charges: OT General Charges $OT Visit: 1 Visit  OT Treatments $Self Care/Home Management : 38-52 mins  Diona Browner OTR/L Acute Rehabilitation Services Office: [phone]    Rebeca Alert 04/21/2019, 11:04 AM

## 2019-04-21 NOTE — Progress Notes (Signed)
STROKE TEAM PROGRESS NOTE   INTERVAL HISTORY    Pt sitting in chair,  Still has global aphasia and right hemiparesis.  . Vitals stable . Labs stable no changes OBJECTIVE Vitals:   04/20/19 2344 04/21/19 0337 04/21/19 0811 04/21/19 1116  BP: (!) 151/61 (!) 172/73 (!) 143/65 (!) 160/60  Pulse: 83 85 80 82  Resp:   20 20  Temp: 99.4 F (37.4 C) 99.3 F (37.4 C) 99.6 F (37.6 C) 99.3 F (37.4 C)  TempSrc: Oral Oral Axillary Oral  SpO2: 96% 96% 97% 100%  Weight:        CBC:  Recent Labs  Lab 04/16/19 2219 04/18/19 0231 04/20/19 0530 04/21/19 0216  WBC 7.2   < > 10.1 8.1  NEUTROABS 5.1  --   --   --   HGB 14.0   < > 13.8 12.1*  HCT 40.9   < > 40.5 35.7*  MCV 93.4   < > 92.5 93.2  PLT 196   < > 186 189   < > = values in this interval not displayed.    Basic Metabolic Panel:  Recent Labs  Lab 04/20/19 0530 04/21/19 0216  NA 133* 138  K 4.8 4.1  CL 102 107  CO2 19* 22  GLUCOSE 103* 135*  BUN 21 19  CREATININE 1.39* 1.34*  CALCIUM 8.4* 8.2*    Lipid Panel:     Component Value Date/Time   CHOL 167 04/17/2019 0300   TRIG 82 04/17/2019 0300   HDL 38 (L) 04/17/2019 0300   CHOLHDL 4.4 04/17/2019 0300   VLDL 16 04/17/2019 0300   LDLCALC 113 (H) 04/17/2019 0300   HgbA1c:  Lab Results  Component Value Date   HGBA1C 5.3 04/17/2019   Urine Drug Screen:     Component Value Date/Time   LABOPIA NONE DETECTED 04/17/2019 0040   COCAINSCRNUR NONE DETECTED 04/17/2019 0040   LABBENZ NONE DETECTED 04/17/2019 0040   AMPHETMU NONE DETECTED 04/17/2019 0040   THCU NONE DETECTED 04/17/2019 0040   LABBARB NONE DETECTED 04/17/2019 0040    Alcohol Level     Component Value Date/Time   ETH <10 04/16/2019 2214    IMAGING  CT ANGIO HEAD W OR WO CONTRAST CT ANGIO NECK W OR WO CONTRAST 04/16/2019 IMPRESSION:  1. Negative for emergent large vessel occlusion, but positive for widespread severe intracranial atherosclerosis.  2. Multifocal large and medium size vessel  stenoses in the anterior and posterior circulation, most notable for: - severe distal Left ICA siphon stenosis due to bulky calcified plaque. - multifocal Left MCA irregularity and stenoses. - up to severe stenoses of both distal Vertebral Arteries, and the Left PCA. - bilateral MCA M3, and Left ACA A2 severe stenoses.  3. But relatively little atherosclerosis in the neck with no significant cervical vertebral or carotid stenosis.  4.  Aortic Atherosclerosis (ICD10-I70.0).  5. Post granulomatous changes in the upper chest including calcified pleural plaques, pulmonary granulomas and mediastinal lymph nodes.   CT CEREBRAL PERFUSION W CONTRAST 04/16/2019 IMPRESSION:  CT Perfusion indicates acute distal Left ACA territory ischemia and infarct. This likely corresponds to the severe left distal A2 arterial stenosis (versus occlusion) seen by CTA.    CT HEAD CODE STROKE WO CONTRAST 04/16/2019 IMPRESSION: 1. No acute intracranial abnormality  2. ASPECTS is 10.  3. Advanced age-related cerebral atrophy with chronic small vessel ischemic disease, with multiple remote lacunar infarcts involving the bilateral basal ganglia, with chronic left cerebellar infarcts.   MRI Head  WO Contrast 04/17/2019 IMPRESSION: 1. Moderate-sized acute ischemic nonhemorrhagic left ACA territory infarct without associated mass effect. 2. Moderate to advanced age-related cerebral atrophy with chronicvsmall vessel ischemic disease and multiple remote lacunar infarctsvabout the bilateral basal ganglia, with additional chronic leftvcerebellar infarcts.  Transthoracic Echocardiogram  04/17/2019 IMPRESSIONS  1. Left ventricular ejection fraction, by visual estimation, is 70 to 75%. The left ventricle has hyperdynamic function. There is no left ventricular hypertrophy.  2. Left ventricular diastolic parameters are consistent with Grade I diastolic dysfunction (impaired relaxation).  3. The left ventricle has no regional wall  motion abnormalities.  4. Global right ventricle has normal systolic function.The right ventricular size is normal. No increase in right ventricular wall thickness.  5. Left atrial size was normal.  6. Right atrial size was normal.  7. The mitral valve is normal in structure. Mild mitral valve  regurgitation. No evidence of mitral stenosis.  8. The tricuspid valve is normal in structure.  9. The tricuspid valve is normal in structure. Tricuspid valve  regurgitation is mild-moderate.  10. The aortic valve is normal in structure. Aortic valve regurgitation is trivial. No evidence of aortic valve sclerosis or stenosis.  11. The pulmonic valve was normal in structure. Pulmonic valve regurgitation is trivial.  12. Mildly elevated pulmonary artery systolic pressure.  13. The tricuspid regurgitant velocity is 2.64 m/s, and with an assumed right atrial pressure of 3 mmHg, the estimated right ventricular systolic pressure is mildly elevated at 30.9 mmHg.  14. The inferior vena cava is normal in size with greater than 50% respiratory variability, suggesting right atrial pressure of 3 mmHg.  15. No intracardiac source of embolism detected on this transthoracic study. A transesophageal echocardiogram is recommended to exclude cardiac source of embolism if clinically indicated.   ECG - SR rate 72 BPM. (See cardiology reading for complete details)  EEG  04/17/19 IMPRESSION: This study is suggestive of cortical dysfunction in left frontotemporal region, likely secondary to underlying structural abnormality. No seizures or definite epileptiform discharges were seen throughout the recording.   PHYSICAL EXAM    Temp:  [98.7 F (37.1 C)-99.6 F (37.6 C)] 99.3 F (37.4 C) (02/03 1116) Pulse Rate:  [80-86] 82 (02/03 1116) Resp:  [16-20] 20 (02/03 1116) BP: (143-172)/(60-73) 160/60 (02/03 1116) SpO2:  [96 %-100 %] 100 % (02/03 1116)  General - Well nourished, well developed elderly caucasian male,  in no apparent distress.  Bruising around right knee and significant ecchymosis on left arm around IV site  Ophthalmologic - fundi not visualized due to noncooperation.  Cardiovascular - Regular rhythm and rate.  Neuro - awake alert, making eye contact, tracking bilaterally.  Global aphasia, did   follow only simple midline and pantomime commands on left side, no speech output.  No gaze preference, blinking to visual threat bilaterally. Right facial droop.  Tongue midline hemolysis.  Left upper extremity at least 3/5, left lower extremity at least 3-/5.  Right upper and lower extremity mild withdrawal on pain stimulation.  DTR 1+, no Babinski. Sensation, coordination and gait not tested.    ASSESSMENT/PLAN [name] is a 86 y.o. male with history of hypertension and  mild dementia (medical care at [facility]), presents to the emergency department with sudden onset of slurred speech, aphasia, left gaze preference with right-sided neglect, right hemiparesis, facial droop and weakness. He received IV t-PA Friday 04/16/19 at 2230.  Stroke: Left ACA territory infarct, likely due to large vessel disease although cardioembolic source not excluded  Code  Stroke CT Head - No acute intracranial abnormality, multiple remote lacunar infarcts involving the bilateral basal ganglia, with chronic left cerebellar infarcts.   MRI head - Moderate-sized acute ischemic nonhemorrhagic left ACA territory infarct  CTA H&N - severe distal Left ICA siphon stenosis due to bulky calcified plaque, multifocal Left MCA irregularity and stenoses, up to severe stenoses of both distal VAs, and the Left PCA. bilateral MCA M3, and Left ACA A2 severe stenoses.   EEG - cortical dysfunction in left frontotemporal region, no seizures.  2D Echo - EF 70-75%. No intracardiac source of embolism   LE venous doppler  Negative for DVT   Consider 30 day cardiac event monitoring as outpt  [employer] Virus 2 - negative  LDL -  113  HgbA1c - 5.3  UDS - negative  VTE prophylaxis - lovenox  No antithrombotic prior to admission, now on ASA 325 and plavix DAPT for 3 months and then ASA alone given significant intracranial stenosis  Ongoing aggressive stroke risk factor management  Therapy recommendations: CIR  Disposition:  Pending  Medical stable for transfer to rehab when bed available  Hypertension  Home BP meds: Zestril  Current BP meds: none   BP stable . Permissive hypertension (OK if < 180/105) but gradually normalize in 5-7 days  . Long-term BP goal normotensive  Hyperlipidemia  Home Lipid lowering medication: none  LDL 113, goal < 70  Current lipid lowering medication: on lipitor 40  Continue statin at discharge  Other Stroke Risk Factors  Advanced age  Hx stroke/TIA - by imaging  Other Active Problems  CKD - stage 3a, Cre 1.50->1.37->1.19  Dementia - on galantamine and Risperdal   Fall - right knee bruise   Hospital day # 5   . Continue aspirin and plavix for 3 months and then aspirin alone. . [facility] and [facility] checking on alternatives    Antony Contras, MD Medical Director [place] Pager: [phone] 04/21/2019 1:28 PM   To contact Stroke Continuity provider, please refer to [email or web address] After hours, contact General Neurology

## 2019-04-21 NOTE — Progress Notes (Signed)
Physical Therapy Treatment Patient Details Name: Scott Gilbert MRN: [id number] DOB: [birth year] Today's Date: 04/21/2019    History of Present Illness 86 y.o. male with past medical history significant for hypertension and mild dementia. He presented to the emergency department with sudden onset of slurred speech and aphasia, facial droop and weakness.   CT cerebral perfusion on 04/16/19 reported: "acute distal Left ACA territory ischemia and infarct."    PT Comments    Pt performed standing and transfer training with emphasis on terminal R knee extension, hip extension, and cervical extension.  He remains mute.  Pt continues to buckle bilaterally and responded well to sara stedy.  Pt continues to benefit from skilled rehab at post acute setting to improve strength and function before returning home.     Follow Up Recommendations  CIR;Supervision/Assistance - 24 hour     Equipment Recommendations  Other (comment)(TBD)    Recommendations for Other Services       Precautions / Restrictions Precautions Precautions: Fall Restrictions Weight Bearing Restrictions: No    Mobility  Bed Mobility               General bed mobility comments: Pt received in recliner chair.  Transfers Overall transfer level: Needs assistance Equipment used: Ambulation equipment used(sara stedy) Transfers: Sit to/from Stand Sit to Stand: Mod assist;+2 physical assistance         General transfer comment: Pt responded better and able to stand longer in controlled environment of the sara stedy.  PTA facilitated Hip and knee extension on R side and cervical extension.  He was able to stand 3-5 min each.  Buckling noted bilaterally.  Ambulation/Gait Ambulation/Gait assistance: (unable)               Stairs             Wheelchair Mobility    Modified Rankin (Stroke Patients Only)       Balance Overall balance assessment: Needs assistance   Sitting balance-Leahy Scale:  Poor       Standing balance-Leahy Scale: Poor Standing balance comment: reliant on external support and +2 assistance with B knee blocking.                            Cognition Arousal/Alertness: Awake/alert Behavior During Therapy: WFL for tasks assessed/performed Overall Cognitive Status: Difficult to assess                                 General Comments: global aphasia, appears to follow very simple 1 step commands with visual and tactile cues;pt smiling during session, attempted to make sounds;required max visual and tactile cues throughout session.      Exercises      General Comments        Pertinent Vitals/Pain Pain Assessment: Faces Faces Pain Scale: Hurts a little bit Pain Location: genralized with mobility, ROM RUE Pain Descriptors / Indicators: Grimacing Pain Intervention(s): Monitored during session;Repositioned;Limited activity within patient's tolerance    Home Living                      Prior Function            PT Goals (current goals can now be found in the care plan section) Acute Rehab PT Goals Patient Stated Goal: unable to state Potential to Achieve Goals: Good Progress towards PT goals: Progressing toward  goals    Frequency    Min 4X/week      PT Plan Current plan remains appropriate    Co-evaluation              AM-PAC PT "6 Clicks" Mobility   Outcome Measure  Help needed turning from your back to your side while in a flat bed without using bedrails?: A Lot Help needed moving from lying on your back to sitting on the side of a flat bed without using bedrails?: A Lot Help needed moving to and from a bed to a chair (including a wheelchair)?: A Lot Help needed standing up from a chair using your arms (e.g., wheelchair or bedside chair)?: A Lot Help needed to walk in hospital room?: Total Help needed climbing 3-5 steps with a railing? : Total 6 Click Score: 10    End of Session Equipment  Utilized During Treatment: Gait belt Activity Tolerance: Patient tolerated treatment well Patient left: in chair;with chair alarm set;with call bell/phone within reach Nurse Communication: Mobility status PT Visit Diagnosis: Other abnormalities of gait and mobility (R26.89);Hemiplegia and hemiparesis Hemiplegia - Right/Left: Right Hemiplegia - caused by: Cerebral infarction     Time: 0350-0938 PT Time Calculation (min) (ACUTE ONLY): 22 min  Charges:  $Therapeutic Activity: 8-22 mins                     Bonney Leitz , PTA Acute Rehabilitation Services Pager [phone] Office [phone]     Prisilla Kocsis Artis Delay 04/21/2019, 2:32 PM

## 2019-04-22 LAB — BASIC METABOLIC PANEL
Anion gap: 11 (ref 5–15)
BUN: 24 mg/dL — ABNORMAL HIGH (ref 8–23)
CO2: 22 mmol/L (ref 22–32)
Calcium: 8.3 mg/dL — ABNORMAL LOW (ref 8.9–10.3)
Chloride: 104 mmol/L (ref 98–111)
Creatinine, Ser: 1.24 mg/dL (ref 0.61–1.24)
GFR calc Af Amer: >60 mL/min (ref >60)
GFR calc non Af Amer: 52 mL/min — ABNORMAL LOW (ref >60)
Glucose, Bld: 127 mg/dL — ABNORMAL HIGH (ref 70–99)
Potassium: 3.7 mmol/L (ref 3.5–5.1)
Sodium: 137 mmol/L (ref 135–145)

## 2019-04-22 LAB — CBC
HCT: 32.6 % — ABNORMAL LOW (ref 39.0–52.0)
Hemoglobin: 11.3 g/dL — ABNORMAL LOW (ref 13.0–17.0)
MCH: 32 pg (ref 26.0–34.0)
MCHC: 34.7 g/dL (ref 30.0–36.0)
MCV: 92.4 fL (ref 80.0–100.0)
Platelets: 187 10*3/uL (ref 150–400)
RBC: 3.53 MIL/uL — ABNORMAL LOW (ref 4.22–5.81)
RDW: 12 % (ref 11.5–15.5)
WBC: 8.2 10*3/uL (ref 4.0–10.5)
nRBC: 0 % (ref 0.0–0.2)

## 2019-04-22 NOTE — Progress Notes (Signed)
STROKE TEAM PROGRESS NOTE   INTERVAL HISTORY    Pt sitting in bedside chair,  Still has global aphasia and right hemiparesis but can follow occasional mimic commands.  . Vitals stable .BMP and CBC stable no changes OBJECTIVE Vitals:   04/22/19 0000 04/22/19 0400 04/22/19 0832 04/22/19 1158  BP: (!) 138/53 (!) 151/61 (!) 167/89 (!) 175/65  Pulse: 78 81 82 79  Resp:   20 20  Temp: 98.7 F (37.1 C) 98.9 F (37.2 C) 98.6 F (37 C) 99.1 F (37.3 C)  TempSrc: Oral Oral Oral Oral  SpO2: 94% 94% 96% 99%  Weight:        CBC:  Recent Labs  Lab 04/16/19 2219 04/18/19 0231 04/21/19 0216 04/22/19 0449  WBC 7.2   < > 8.1 8.2  NEUTROABS 5.1  --   --   --   HGB 14.0   < > 12.1* 11.3*  HCT 40.9   < > 35.7* 32.6*  MCV 93.4   < > 93.2 92.4  PLT 196   < > 189 187   < > = values in this interval not displayed.    Basic Metabolic Panel:  Recent Labs  Lab 04/21/19 0216 04/22/19 0449  NA 138 137  K 4.1 3.7  CL 107 104  CO2 22 22  GLUCOSE 135* 127*  BUN 19 24*  CREATININE 1.34* 1.24  CALCIUM 8.2* 8.3*    Lipid Panel:     Component Value Date/Time   CHOL 167 04/17/2019 0300   TRIG 82 04/17/2019 0300   HDL 38 (L) 04/17/2019 0300   CHOLHDL 4.4 04/17/2019 0300   VLDL 16 04/17/2019 0300   LDLCALC 113 (H) 04/17/2019 0300   HgbA1c:  Lab Results  Component Value Date   HGBA1C 5.3 04/17/2019   Urine Drug Screen:     Component Value Date/Time   LABOPIA NONE DETECTED 04/17/2019 0040   COCAINSCRNUR NONE DETECTED 04/17/2019 0040   LABBENZ NONE DETECTED 04/17/2019 0040   AMPHETMU NONE DETECTED 04/17/2019 0040   THCU NONE DETECTED 04/17/2019 0040   LABBARB NONE DETECTED 04/17/2019 0040    Alcohol Level     Component Value Date/Time   ETH <10 04/16/2019 2214    IMAGING  CT ANGIO HEAD W OR WO CONTRAST CT ANGIO NECK W OR WO CONTRAST 04/16/2019 IMPRESSION:  1. Negative for emergent large vessel occlusion, but positive for widespread severe intracranial atherosclerosis.  2.  Multifocal large and medium size vessel stenoses in the anterior and posterior circulation, most notable for: - severe distal Left ICA siphon stenosis due to bulky calcified plaque. - multifocal Left MCA irregularity and stenoses. - up to severe stenoses of both distal Vertebral Arteries, and the Left PCA. - bilateral MCA M3, and Left ACA A2 severe stenoses.  3. But relatively little atherosclerosis in the neck with no significant cervical vertebral or carotid stenosis.  4.  Aortic Atherosclerosis (ICD10-I70.0).  5. Post granulomatous changes in the upper chest including calcified pleural plaques, pulmonary granulomas and mediastinal lymph nodes.   CT CEREBRAL PERFUSION W CONTRAST 04/16/2019 IMPRESSION:  CT Perfusion indicates acute distal Left ACA territory ischemia and infarct. This likely corresponds to the severe left distal A2 arterial stenosis (versus occlusion) seen by CTA.    CT HEAD CODE STROKE WO CONTRAST 04/16/2019 IMPRESSION: 1. No acute intracranial abnormality  2. ASPECTS is 10.  3. Advanced age-related cerebral atrophy with chronic small vessel ischemic disease, with multiple remote lacunar infarcts involving the bilateral basal ganglia, with  chronic left cerebellar infarcts.   MRI Head WO Contrast 04/17/2019 IMPRESSION: 1. Moderate-sized acute ischemic nonhemorrhagic left ACA territory infarct without associated mass effect. 2. Moderate to advanced age-related cerebral atrophy with chronicvsmall vessel ischemic disease and multiple remote lacunar infarctsvabout the bilateral basal ganglia, with additional chronic leftvcerebellar infarcts.  Transthoracic Echocardiogram  04/17/2019 IMPRESSIONS  1. Left ventricular ejection fraction, by visual estimation, is 70 to 75%. The left ventricle has hyperdynamic function. There is no left ventricular hypertrophy.  2. Left ventricular diastolic parameters are consistent with Grade I diastolic dysfunction (impaired relaxation).  3. The  left ventricle has no regional wall motion abnormalities.  4. Global right ventricle has normal systolic function.The right ventricular size is normal. No increase in right ventricular wall thickness.  5. Left atrial size was normal.  6. Right atrial size was normal.  7. The mitral valve is normal in structure. Mild mitral valve  regurgitation. No evidence of mitral stenosis.  8. The tricuspid valve is normal in structure.  9. The tricuspid valve is normal in structure. Tricuspid valve  regurgitation is mild-moderate.  10. The aortic valve is normal in structure. Aortic valve regurgitation is trivial. No evidence of aortic valve sclerosis or stenosis.  11. The pulmonic valve was normal in structure. Pulmonic valve regurgitation is trivial.  12. Mildly elevated pulmonary artery systolic pressure.  13. The tricuspid regurgitant velocity is 2.64 m/s, and with an assumed right atrial pressure of 3 mmHg, the estimated right ventricular systolic pressure is mildly elevated at 30.9 mmHg.  14. The inferior vena cava is normal in size with greater than 50% respiratory variability, suggesting right atrial pressure of 3 mmHg.  15. No intracardiac source of embolism detected on this transthoracic study. A transesophageal echocardiogram is recommended to exclude cardiac source of embolism if clinically indicated.   ECG - SR rate 72 BPM. (See cardiology reading for complete details)  EEG  04/17/19 IMPRESSION: This study is suggestive of cortical dysfunction in left frontotemporal region, likely secondary to underlying structural abnormality. No seizures or definite epileptiform discharges were seen throughout the recording.   PHYSICAL EXAM    Temp:  [98.1 F (36.7 C)-99.5 F (37.5 C)] 99.1 F (37.3 C) (02/04 1158) Pulse Rate:  [78-82] 79 (02/04 1158) Resp:  [20] 20 (02/04 1158) BP: (138-175)/(53-89) 175/65 (02/04 1158) SpO2:  [94 %-99 %] 99 % (02/04 1158)  General - Well nourished, well  developed elderly caucasian male, in no apparent distress.  Bruising around right knee and significant ecchymosis on left arm around IV site  Ophthalmologic - fundi not visualized due to noncooperation.  Cardiovascular - Regular rhythm and rate.  Neuro - awake alert, making eye contact, tracking bilaterally.  Global aphasia, did   follow only simple midline and pantomime commands on left side, no speech output.  No gaze preference, blinking to visual threat bilaterally. Right facial droop.  Tongue midline hemolysis.  Left upper extremity at least 3/5, left lower extremity at least 3-/5.  Right upper and lower extremity mild withdrawal on pain stimulation.  DTR 1+, no Babinski. Sensation, coordination and gait not tested.    ASSESSMENT/PLAN Mr. Vandy Tsuchiya is a 86 y.o. male with history of hypertension and  mild dementia (medical care at [facility]), presents to the emergency department with sudden onset of slurred speech, aphasia, left gaze preference with right-sided neglect, right hemiparesis, facial droop and weakness. He received IV t-PA Friday 04/16/19 at 2230.  Stroke: Left ACA territory infarct, likely due to large vessel  disease although cardioembolic source not excluded  Code Stroke CT Head - No acute intracranial abnormality, multiple remote lacunar infarcts involving the bilateral basal ganglia, with chronic left cerebellar infarcts.   MRI head - Moderate-sized acute ischemic nonhemorrhagic left ACA territory infarct  CTA H&N - severe distal Left ICA siphon stenosis due to bulky calcified plaque, multifocal Left MCA irregularity and stenoses, up to severe stenoses of both distal VAs, and the Left PCA. bilateral MCA M3, and Left ACA A2 severe stenoses.   EEG - cortical dysfunction in left frontotemporal region, no seizures.  2D Echo - EF 70-75%. No intracardiac source of embolism   LE venous doppler  Negative for DVT   Consider 30 day cardiac event monitoring as outpt  [employer]  Virus 2 - negative  LDL - 113  HgbA1c - 5.3  UDS - negative  VTE prophylaxis - lovenox  No antithrombotic prior to admission, now on ASA 325 and plavix DAPT for 3 months and then ASA alone given significant intracranial stenosis  Ongoing aggressive stroke risk factor management  Therapy recommendations: CIR  Disposition:  Pending  Medical stable for transfer to rehab when bed available  Hypertension  Home BP meds: Zestril  Current BP meds: none   BP stable . Permissive hypertension (OK if < 180/105) but gradually normalize in 5-7 days  . Long-term BP goal normotensive  Hyperlipidemia  Home Lipid lowering medication: none  LDL 113, goal < 70  Current lipid lowering medication: on lipitor 40  Continue statin at discharge  Other Stroke Risk Factors  Advanced age  Hx stroke/TIA - by imaging  Other Active Problems  CKD - stage 3a, Cre 1.50->1.37->1.19  Dementia - on galantamine and Risperdal   Fall - right knee bruise   Hospital day # 6   . Continue aspirin and plavix for 3 months and then aspirin alone. . [facility] and [facility] checking on alternatives Called    pt`s daughter and left message to discuss goals of care and consider palliative care as well.   Antony Contras, MD Medical Director [place] Pager: [phone] 04/22/2019 12:50 PM   To contact Stroke Continuity provider, please refer to [email or web address] After hours, contact General Neurology

## 2019-04-22 NOTE — TOC Progression Note (Signed)
Transition of Care [facility]) - Progression Note    Patient Details  Name: Scott Gilbert MRN: [id number] Date of Birth: [birth year]  Transition of Care [facility]) CM/SW Contact  Baldemar Lenis, [name] Phone Number: 04/22/2019, 12:06 PM  Clinical Narrative:   CSW received call from Arva Chafe, who is patient's payee/fiduciary with the [place]. Doug discussed briefly that the daughters are not to get any of the patient's financial information due to family dynamics, and that Gala Romney has been managing the patient's finances for some time. Doug indicated that the [place] has been providing the patient's care up through now, so does not know why the [place] is now saying that he is not qualified for coverage at this time. Doug to call [facility] to investigate and determine what needs to happen in order for the [facility] to assist with the patient's rehab. CSW provided [place] with an overview of the patient's current functioning and needs, and Doug to reach out to the [place] to see about any assistance that they can provide. CSW to continue to follow.    Expected Discharge Plan: IP Rehab Facility Barriers to Discharge: Continued Medical Work up, Inadequate or no insurance  Expected Discharge Plan and Services Expected Discharge Plan: IP Rehab Facility       Living arrangements for the past 2 months: Single Family Home                                       Social Determinants of Health (SDOH) Interventions    Readmission Risk Interventions No flowsheet data found.

## 2019-04-22 NOTE — Progress Notes (Addendum)
Physical Therapy Treatment Patient Details Name: Scott Gilbert MRN: [id number] DOB: Jun 16, 1929 Today's Date: 04/22/2019    History of Present Illness 86 y.o. male with past medical history significant for hypertension and mild dementia. He presented to the emergency department with sudden onset of slurred speech and aphasia, facial droop and weakness.   CT cerebral perfusion on 04/16/19 reported: "acute distal Left ACA territory ischemia and infarct."    PT Comments    Pt performed LE strengthening exercises.  He required PROM on RLE but able to follow commands to do AROM in LLE.  He continues to benefit from aggressive rehab at [facility] to improve strength and function to reduce caregiver burden before returning home.     Follow Up Recommendations  CIR;Supervision/Assistance - 24 hour     Equipment Recommendations  Other (comment)(TBD)    Recommendations for Other Services Rehab consult     Precautions / Restrictions Precautions Precautions: Fall Restrictions Weight Bearing Restrictions: No    Mobility  Bed Mobility Overal bed mobility: Needs Assistance Bed Mobility: Sidelying to Sit;Rolling Rolling: Max assist Sidelying to sit: Max assist       General bed mobility comments: Pt utilized LUE to pull his body to edg of bed,  Able to move LLE but required assistance with RLE.  Transfers Overall transfer level: Needs assistance Equipment used: None Transfers: Squat Pivot Transfers     Squat pivot transfers: Max assist     General transfer comment: Blocking of B knees. Face to face pivot from bed to recliner.  Ambulation/Gait Ambulation/Gait assistance: (unable.)               Stairs             Wheelchair Mobility    Modified Rankin (Stroke Patients Only) Modified Rankin (Stroke Patients Only) Pre-Morbid Rankin Score: No symptoms Modified Rankin: Severe disability     Balance     Sitting balance-Leahy Scale: Poor Sitting balance - Comments:  minguard-minA for static sitting balance;frequent light tactile cues to right posture     Standing balance-Leahy Scale: Poor                              Cognition Arousal/Alertness: Awake/alert Behavior During Therapy: WFL for tasks assessed/performed Overall Cognitive Status: Difficult to assess                                 General Comments: global aphasia, appears to follow very simple 1 step commands with visual and tactile cues;pt smiling during session, attempted to make sounds;required max visual and tactile cues throughout session.      Exercises General Exercises - Lower Extremity Ankle Circles/Pumps: AROM;PROM;Both;10 reps;Supine Heel Slides: AROM;PROM;Both;10 reps;Supine Hip ABduction/ADduction: AROM;PROM;Both;10 reps;Supine    General Comments        Pertinent Vitals/Pain Pain Assessment: Faces Faces Pain Scale: Hurts a little bit Pain Location: genralized with mobility, ROM RUE Pain Descriptors / Indicators: Grimacing Pain Intervention(s): Monitored during session;Repositioned    Home Living                      Prior Function            PT Goals (current goals can now be found in the care plan section) Acute Rehab PT Goals Patient Stated Goal: unable to state Potential to Achieve Goals: Good Progress towards PT goals:  Progressing toward goals    Frequency           PT Plan Current plan remains appropriate    Co-evaluation              AM-PAC PT "6 Clicks" Mobility   Outcome Measure  Help needed turning from your back to your side while in a flat bed without using bedrails?: A Lot Help needed moving from lying on your back to sitting on the side of a flat bed without using bedrails?: A Lot Help needed moving to and from a bed to a chair (including a wheelchair)?: A Lot Help needed standing up from a chair using your arms (e.g., wheelchair or bedside chair)?: A Lot Help needed to walk in hospital  room?: Total Help needed climbing 3-5 steps with a railing? : Total 6 Click Score: 10    End of Session Equipment Utilized During Treatment: Gait belt Activity Tolerance: Patient tolerated treatment well Patient left: in chair;with chair alarm set;with call bell/phone within reach Nurse Communication: Mobility status(R knee bleeding.) PT Visit Diagnosis: Other abnormalities of gait and mobility (R26.89);Hemiplegia and hemiparesis Hemiplegia - Right/Left: Right Hemiplegia - caused by: Cerebral infarction     Time: 1001-1027 PT Time Calculation (min) (ACUTE ONLY): 26 min  Charges:  $Therapeutic Exercise: 8-22 mins $Therapeutic Activity: 8-22 mins                     Scott Gilbert , PTA Acute Rehabilitation Services Pager [phone] Office [phone]     Scott Gilbert 04/22/2019, 4:56 PM

## 2019-04-22 NOTE — Progress Notes (Addendum)
  Speech Language Pathology Treatment: Cognitive-Linquistic;Dysphagia  Patient Details Name: Scott Gilbert MRN: [id number] DOB: 12-08-1929 Today's Date: 04/22/2019 Time: 6237-6283 SLP Time Calculation (min) (ACUTE ONLY): 24 min  Assessment / Plan / Recommendation Clinical Impression  Patient received in chair in room. Pt presents with severe receptive and expressive aphasia. Pt able to visually track clinician, some low level vocalization observed. Pt appearing to attempt vocalization with labial groping observed. ?element of oral apraxia present. Pt able to follow command to open mouth provided max cues and extended time in 50% of opportunities. Patient with no verbalizations in repetition task. Pt able to use left hand to grasp items in his immediate visual field. ST trialed use of mulitimodal communication approaches, including use of external visual aids. Patient able to visually track letters/words, but did not demonstrate ability to point. ST attempted MIT, pt with no response.    Pt seen with ice chips, thin liquids via straw and cup sips. Pt noted to have some lingual pumping at rest. In the majority of thin liquid trials, pt noted to have double swallows. Pt with significant coughing x1 following somewhat large self-administered cup sip of water. Cough noted to be relatively strong. Pt with mild wet voice quality noted following some sips of thin liquid, however it was difficulty to ascertain as patient unable to voice on command.  Patient seen with puree solids: adequate oral acceptance, prolonged mastication and "munch chew" pattern. Mild/mod diffuse oral residue after the swallow. Cleared w/ liquid rinse. Pt seen with fruit on the bottom blueberry yogurt. Pt with difficulty with bolus cohesion and mastication. After the swallow, pt with significant residue that ST removed with oral suction.   Recommend continue dysphagia 1/thin liquids, small sips only. Pt may benefit from MBS to  objectively assess swallow function and inform plan of care.   Note: pt has upper dentures that are ill-fitting. ST removed for this session, placed in denture container by sink.  ST to follow.   HPI HPI: Scott Gilbert is a 86 y.o. male with past medical history significant for hypertension, mild dementia presented to the emergency department with sudden onset of slurred speech and aphasia, facial droop and weakness.   CT cerebral perfusion on 04/16/19 reported: "acute distal Left ACA territory ischemia and infarct."       SLP Plan  Continue with current plan of care       Recommendations  Diet recommendations: Thin liquid;Dysphagia 1 (puree) Liquids provided via: Cup;Teaspoon;Straw Medication Administration: Crushed with puree Supervision: Full supervision/cueing for compensatory strategies Compensations: Minimize environmental distractions;Slow rate;Small sips/bites Postural Changes and/or Swallow Maneuvers: Seated upright 90 degrees                Oral Care Recommendations: Oral care BID;Staff/trained caregiver to provide oral care Follow up Recommendations: 24 hour supervision/assistance;Skilled Nursing facility SLP Visit Diagnosis: Aphasia (R47.01);Dysphagia, unspecified (R13.10) Plan: Continue with current plan of care       GO               Shella Spearing, M.Ed., CCC-SLP Speech Therapy Acute Rehabilitation [phone]: Acute Rehab office [phone] - pager   Mikalah Skyles 04/22/2019, 11:32 AM

## 2019-04-23 ENCOUNTER — Inpatient Hospital Stay (HOSPITAL_COMMUNITY): Payer: No Typology Code available for payment source

## 2019-04-23 DIAGNOSIS — Z609 Problem related to social environment, unspecified: Secondary | ICD-10-CM

## 2019-04-23 DIAGNOSIS — Z515 Encounter for palliative care: Secondary | ICD-10-CM

## 2019-04-23 DIAGNOSIS — Z658 Other specified problems related to psychosocial circumstances: Secondary | ICD-10-CM

## 2019-04-23 DIAGNOSIS — K117 Disturbances of salivary secretion: Secondary | ICD-10-CM

## 2019-04-23 DIAGNOSIS — Z7189 Other specified counseling: Secondary | ICD-10-CM

## 2019-04-23 DIAGNOSIS — I6300 Cerebral infarction due to thrombosis of unspecified precerebral artery: Secondary | ICD-10-CM

## 2019-04-23 DIAGNOSIS — M6281 Muscle weakness (generalized): Secondary | ICD-10-CM

## 2019-04-23 MED ORDER — SENNOSIDES-DOCUSATE SODIUM 8.6-50 MG PO TABS
2.0000 | ORAL_TABLET | Freq: Two times a day (BID) | ORAL | Status: DC
  Administered 2019-04-23 – 2019-07-02 (×131): 2 via ORAL
  Filled 2019-04-23 (×133): qty 2

## 2019-04-23 NOTE — Progress Notes (Addendum)
STROKE TEAM PROGRESS NOTE   INTERVAL HISTORY Patient sitting up in a bedside chair.  He will visually track you and follow only occasional pantomime command but is nonverbal and globally aphasic.  The patient does not have insurance to go to nursing home and is unclear if family is able to take him home.  Family dynamics are such that the financial power of attorney and not talk to health power of attorney which will delay discharge to nursing home  OBJECTIVE Vitals:   04/22/19 1950 04/23/19 0015 04/23/19 0704 04/23/19 0910  BP: (!) 179/64 (!) 166/59 (!) 180/76 (!) 144/67  Pulse: 85 76 81   Resp: 17 19 20    Temp: 98.3 F (36.8 C) 98.9 F (37.2 C) 98.4 F (36.9 C)   TempSrc: Oral Oral Oral   SpO2: 97% 99% 94%   Weight:        CBC:  Recent Labs  Lab 04/16/19 2219 04/18/19 0231 04/21/19 0216 04/22/19 0449  WBC 7.2   < > 8.1 8.2  NEUTROABS 5.1  --   --   --   HGB 14.0   < > 12.1* 11.3*  HCT 40.9   < > 35.7* 32.6*  MCV 93.4   < > 93.2 92.4  PLT 196   < > 189 187   < > = values in this interval not displayed.    Basic Metabolic Panel:  Recent Labs  Lab 04/21/19 0216 04/22/19 0449  NA 138 137  K 4.1 3.7  CL 107 104  CO2 22 22  GLUCOSE 135* 127*  BUN 19 24*  CREATININE 1.34* 1.24  CALCIUM 8.2* 8.3*    IMAGING past 24h No results found.   PHYSICAL EXAM     General - Well nourished, well developed elderly caucasian male, in no apparent distress.  Bruising around right knee and significant ecchymosis on left arm around IV site  Ophthalmologic - fundi not visualized due to noncooperation.  Cardiovascular - Regular rhythm and rate.  Neuro - awake alert, making eye contact, tracking bilaterally.  Global aphasia, did   follow only simple midline and pantomime commands on left side, no speech output.  No gaze preference, blinking to visual threat bilaterally. Right facial droop.  Tongue midline hemolysis.  Left upper extremity at least 3/5, left lower extremity at least  3-/5.  Right upper and lower extremity mild withdrawal on pain stimulation.  DTR 1+, no Babinski. Sensation, coordination and gait not tested.   ASSESSMENT/PLAN [name] is a 86 y.o. male with history of hypertension and  mild dementia (medical care at [facility]), presents to the emergency department with sudden onset of slurred speech, aphasia, left gaze preference with right-sided neglect, right hemiparesis, facial droop and weakness. He received IV t-PA Friday 04/16/19 at 2230.  Stroke: Left ACA territory infarct, likely due to large vessel disease although cardioembolic source not excluded  Code Stroke CT Head - No acute intracranial abnormality, multiple remote lacunar infarcts involving the bilateral basal ganglia, with chronic left cerebellar infarcts.   MRI head - Moderate-sized acute ischemic nonhemorrhagic left ACA territory infarct  CTA H&N - severe distal Left ICA siphon stenosis due to bulky calcified plaque, multifocal Left MCA irregularity and stenoses, up to severe stenoses of both distal VAs, and the Left PCA. bilateral MCA M3, and Left ACA A2 severe stenoses.   EEG - cortical dysfunction in left frontotemporal region, no seizures.  2D Echo - EF 70-75%. No intracardiac source of embolism  LE venous doppler  Negative for DVT   Consider 30 day cardiac event monitoring as outpt  [employer] Virus 2 - negative  LDL - 113  HgbA1c - 5.3  UDS - negative  VTE prophylaxis - lovenox  No antithrombotic prior to admission, now on ASA 325 and plavix DAPT for 3 months and then ASA alone given significant intracranial stenosis  Ongoing aggressive stroke risk factor management  Therapy recommendations: CIR->SNF   Disposition:  Pending - pt without CIR or SNF coverage - SW involved in working with [facility] and durable POA to determine a course  Medically ready for d/c once d/c plan determined. Transitioned to every shift evaluations. D/c tele.  Palliative consult at request  of TOC LOS rounds  Hypertension  Home BP meds: Zestril 2.5  Current BP meds: lisinopril 5  BP stable . Permissive hypertension (OK if < 180/105) but gradually normalize in 5-7 days  . Long-term BP goal normotensive  Hyperlipidemia  Home Lipid lowering medication: none  LDL 113, goal < 70  Current lipid lowering medication: on lipitor 40  Continue statin at discharge  Dysphagia . Secondary to stroke . On D1 thin liquids, meds crushed in puree . Speech on board  Other Stroke Risk Factors  Advanced age  Hx stroke/TIA - by imaging  Other Active Problems  CKD - stage 3a, Cre 1.24   Dementia - on galantamine and Risperdal   Fall - right knee bruise  Hospital day # 7 Patient is medically stable to be discharged to nursing home but unfortunately he does not have insurance and social worker is working on family dynamics to look at alternatives I spoke to his daughter Steward Drone and gave update and discussed alternatives [name], MD Medical Director [facility] Pager: [phone] 04/23/2019 9:21 AM   To contact Stroke Continuity provider, please refer to [email or web address]. After hours, contact General Neurology

## 2019-04-23 NOTE — Progress Notes (Signed)
MD Pearlean Brownie notified about pt RR at 30 while resting. Temp: 99.2 BP: 158/68 and HR ranging between 95-122 at rest. Nurse will continue to monitor patient.

## 2019-04-23 NOTE — Consult Note (Signed)
Consultation Note Date: 04/23/2019   Patient Name: Scott Gilbert  DOB: [birth year]  MRN: [id number]  Age / Sex: 86 y.o., male  PCP: [facility] Referring Physician: Micki Riley, MD  Reason for Consultation: Establishing goals of care  HPI/Patient Profile:  Per intake H&P --> Scott Gilbert is a 86 y.o. male with past medical history significant for hypertension, mild dementia presents to the emergency department with sudden onset of slurred speech and aphasia, facial droop and weakness.  According to the patient's daughter, patient was his normal self when around 4 PM family witnessed patient suddenly having slurred speech, however facial droop and weak.  EMS was called their assessment patient having left gaze preference with right-sided neglect, right hemiparesis and aphasia.  Blood pressure was 140 systolic.  Arrived to [facility], ED as a code stroke.  On arrival, patient was taken medially to CT scan.  Stat CT head was obtained which showed significant atrophy but no acute ischemic changes/hemorrhage.  Patient was administered IV TPA after discussing risk versus benefit with daughter.  CT angiogram was obtained which showed no large vessel occlusion however so severe intracranial atherosclerotic disease.  As patient continued to have significant deficits despite IV TPA administration, CT perfusion was performed to assess if there was a thrombus that could have been missed on CTA.  CT perfusion demonstrated core/penumbra in the ACA territory with possible occlusion of the A2 segment, not a candidate for thrombectomy due to distal site of occlusion  Past medical records are not available as patient seeks medical care at the [place].  Clinical Assessment and Goals of Care: I have reviewed medical records including EPIC notes, labs and imaging, received report from bedside RN, assessed the  patient.    I called Scott Gilbert to further discuss diagnosis prognosis, GOC, EOL wishes, disposition and options.   I introduced Palliative Medicine as specialized medical care for people living with serious illness. It focuses on providing relief from the symptoms and stress of a serious illness. The goal is to improve quality of life for both the patient and the family.  I spoke to patients daughter Scott Gilbert about Scott Gilbert present condition. She shared that Scott Gilbert has lived with her for quite sometime now. She stated that he was mostly independent prior to this hospitalization. He was able to fix his own breakfast in the microwave, get himself dressed, bath. He has three daughters. 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 Scott Gilbert enjoyed reading the bible, magazines, going for walks, and watching TV in his spare time. He did not require any assistive aids for mobility. Patient is [facility] and considered to be a prayerful man.   Per Scott Gilbert he has experienced a "rapid" change. We talked about how a stroke can affect someone and how often we must wait sometime before we understand a patients new baseline level of function.  At the present time Scott Gilbert does not know how she will take Scott Gilbert home as he will require 24/7 caregivers. She is a full time mail carrier and the president of the [employer] for [facility] [employer] which brings her great joy. This role often requires that she be out of town for one night to one week. She states that she has two sons and sister who may be able to help though she cannot right now assure 24/7 ability to care for Scott Gilbert. We discussed the trials associated with being a caregiver. Talked about getting estimates for SNF placement and daily caregivers costs.   We discussed Scott Gilbert code status. Scott Gilbert said as of right now she would wish for a  trial of all measures inclusive of CPR/Intubation/Shocks/Transfer to ICU/feeding tubes. She said that it would not sit well with her if she did not try to help him and she firmly believes that this is what he would want for himself.  I shared with Scott Gilbert that given all of Scott Gilbert underlying conditions if he were to deteriorate that further discussion of hospice could be pursued. We went over what Palliative Care and Hospice are. The differences were explained in great detail.  She is agreeable to OP Palliative FU.   At the present time Scott Gilbert hopes that Scott Gilbert will make some degree of improvement.   Discussed the importance of continued conversation with family and their  medical providers regarding overall plan of care and treatment options, ensuring decisions are within the context of the patients values and GOCs.  Decision Maker: Scott Gilbert (Daughter) [phone]   SUMMARY OF RECOMMENDATIONS   Full Code / Full scope TOC - Outpatient referral to Hospice Social - complicated discharge d/t lack of insurance coverage  Code Status/Advance Care Planning:  Full code  Symptom Management:    Muscular Weakness in the setting of Left ACA Infarct:                 - PT                 - OT Dysphagia:   - Speech consult completed, passed evaluation  - D2 Diet w. thins Xerostomia:                 - Good oral care QShift                 - Encourage liquid intake Constipation:  - Senna S BID Delirium Precuations:                  - Get up during the day                 - Encourage a familiar face to remain present throughout the day                 - Keep blinds open and lights on during daylight hours                 - Minimize the use of opioids/benzodiazepines Spiritual:                 - Chaplain consult  Social:  - Appreciate SW help. Patient has [employer] though is not service connected therefore cannot go to SNF  Palliative Prophylaxis:   Aspiration, Bowel Regimen,  Delirium Protocol, Eye Care, Frequent Pain Assessment, Oral Care, Palliative Wound Care and Turn Reposition  Additional Recommendations (Limitations, Scope, Preferences):  Full Scope Treatment  Psycho-social/Spiritual:   Desire for further Chaplaincy support: Yes  Additional Recommendations: Caregiving  Support/Resources  Prognosis:   Unable to determine  Discharge Planning: Home with Home Health      Primary Diagnoses: Present on Admission: . Stroke (cerebrum) ([place])   I have reviewed the medical record, interviewed the patient and family, and examined the patient. The following aspects are pertinent.  Past Medical History:  Diagnosis Date  . Hypertension   . Mild dementia ([place])    Social History   Socioeconomic History  . Marital status: Single    Spouse name: Not on file  . Number of children: Not on file  . Years of education: Not on file  . Highest education level: Not on file  Occupational History  . Not on file  Tobacco Use  . Smoking status: Not on file  Substance and Sexual Activity  . Alcohol use: Not on file  . Drug use: Not on file  . Sexual activity: Not on file  Other Topics Concern  . Not on file  Social History Narrative  . Not on file   Social Determinants of Health   Financial Resource Strain:   . Difficulty of Paying Living Expenses: Not on file  Food Insecurity:   . Worried About Charity fundraiser in the Last Year: Not on file  . Ran Out of Food in the Last Year: Not on file  Transportation Needs:   . Lack of Transportation (Medical): Not on file  . Lack of Transportation (Non-Medical): Not on file  Physical Activity:   . Days of Exercise per Week: Not on file  . Minutes of Exercise per Session: Not on file  Stress:   . Feeling of Stress : Not on file  Social Connections:   . Frequency of Communication with Friends and Family: Not on file  . Frequency of Social Gatherings with Friends and Family: Not on file  . Attends  Religious Services: Not on file  . Active Member of Clubs or Organizations: Not on file  . Attends Archivist Meetings: Not on file  . Marital Status: Not on file   History reviewed. No pertinent family history. Scheduled Meds: .  stroke: mapping our early stages of recovery book   Does not apply Once  . acyclovir  400 mg Oral BID  . aspirin EC  325 mg Oral Daily  . atorvastatin  40 mg Oral q1800  . clopidogrel  75 mg Oral Daily  . enoxaparin (LOVENOX) injection  30 mg Subcutaneous Q24H  . galantamine  8 mg Oral QHS  . lisinopril  5 mg Oral Daily  . mouth rinse  15 mL Mouth Rinse BID  . multivitamin with minerals  1 tablet Oral Daily  . pantoprazole sodium  40 mg Oral Daily  . risperiDONE  0.5 mg Oral QHS   Continuous Infusions: PRN Meds:.acetaminophen **OR** acetaminophen (TYLENOL) oral liquid 160 mg/5 mL **OR** acetaminophen, labetalol, polyvinyl alcohol, senna-docusate Medications Prior to Admission:  Prior to Admission medications   Medication Sig Start Date End Date Taking? Authorizing Provider  acyclovir (ZOVIRAX) 200 MG capsule Take 400 mg by mouth 2 (two) times daily.   Yes [provider]  carboxymethylcellulose (REFRESH PLUS) 0.5 % SOLN Place 1 drop into the right eye 4 (four) times daily as needed (dry eye).   Yes [provider]  galantamine (RAZADYNE) 8 MG tablet Take 8 mg by mouth at bedtime.   Yes [provider]  lisinopril (ZESTRIL) 5 MG tablet Take 2.5 mg by mouth daily.   Yes [provider]  Multiple Vitamins-Minerals (MULTIVITAMIN WITH MINERALS) tablet Take 1 tablet by mouth daily.   Yes [provider]  omeprazole (PRILOSEC) 20 MG capsule Take 20 mg by mouth every evening.   Yes [provider]  risperiDONE (RISPERDAL) 1 MG/ML oral solution Take 0.5 mg by mouth at bedtime.   Yes [provider]  Rayburn Ma Oil (SOOTHE NIGHTTIME) OINT Place 1 application into the right eye at  bedtime.   Yes [provider]   No Known Allergies Review of Systems  Unable to perform ROS  Physical Exam Vitals and nursing note reviewed.  HENT:     Head: Normocephalic.     Nose: Nose normal.     Mouth/Throat:     Mouth: Mucous membranes are dry.  Eyes:     Pupils: Pupils are equal, round, and reactive to light.  Cardiovascular:     Rate and Rhythm: Tachycardia present.     Pulses: Normal pulses.  Pulmonary:     Effort: Pulmonary effort is normal.  Abdominal:     Palpations: Abdomen is soft.  Musculoskeletal:     Cervical back: Normal range of motion.  Skin:    General: Skin is warm.     Capillary Refill: Capillary refill takes less than 2 seconds.  Neurological:     Mental Status: He is alert.     Comments: Expressive apahasia  Psychiatric:        Speech: He is noncommunicative.     Comments: Due to apahsia    Vital Signs: BP (!) 158/68 (BP Location: Left Arm)   Pulse (!) 101   Temp 99.2 F (37.3 C) (Oral)   Resp 19   Wt 62.3 kg   SpO2 94%  Pain Scale: 0-10   Pain Score: 0-No pain  SpO2: SpO2: 94 % O2 Device:SpO2: 94 % O2 Flow Rate: .   IO: Intake/output summary:   Intake/Output Summary (Last 24 hours) at 04/23/2019 1555 Last data filed at 04/23/2019 0400 Gross per 24 hour  Intake 120 ml  Output 150 ml  Net -30 ml   LBM: Last BM Date: 04/22/19 Baseline Weight: Weight: 65 kg Most recent weight: Weight: 62.3 kg     Palliative Assessment/Data: 30%   Time In: 1400 Time Out: 1510 Time Total: 70 Greater than 50%  of this time was spent counseling and coordinating care related to the above assessment and plan.  Signed by: Ernie Avena, NP   Please contact Palliative Medicine Team phone at [phone] for questions and concerns.  For individual provider: See Loretha Stapler

## 2019-04-23 NOTE — Progress Notes (Signed)
Modified Barium Swallow Progress Note  Patient Details  Name: Obrien Huskins MRN: [id number] Date of Birth: [birth year]  Today's Date: 04/23/2019  Modified Barium Swallow completed.  Full report located under Chart Review in the Imaging Section.  Brief recommendations include the following:  Clinical Impression  Modified Barium Swallow evaluation completed to objectively assess pt's swallowing function. Pt has been tolerating Dys 1, puree diet with thin liquids. Pt has mild oral dysphagia with no penetration/aspiration during MBS. Consistenties trialed were thin liquids, puree, cracker, and pill with liquid wash. Pt is edentulous and required prolonged mastication but good bolus manipulation. Oral transfer was swift and complete except with the barium pill, that took 3 attempts for A-P transfer. Pill also became wedged in valleculae. Unable to clear with liquid, but cleared with puree.  Normal swallow initiation, no pharyngeal residue, complete clearance of esophagus on esophageal sweep. Recommend Dys 2, thin liquids with medication given whole in puree. ST will continue to monitor for tolerance and diet upgrade for remainder of acute stay.    Swallow Evaluation Recommendations       SLP Diet Recommendations: Dysphagia 2 (Fine chop) solids;Thin liquid   Liquid Administration via: Cup;Straw   Medication Administration: Whole meds with puree   Supervision: Full assist for feeding   Compensations: Minimize environmental distractions;Slow rate;Small sips/bites   Postural Changes: Seated upright at 90 degrees   Oral Care Recommendations: Oral care BID        Luis Abed., MA, CCC-SLP 04/23/2019,2:32 PM

## 2019-04-23 NOTE — Progress Notes (Addendum)
Physical Therapy Treatment Patient Details Name: Scott Gilbert MRN: [id number] DOB: [birth year] Today's Date: 04/23/2019    History of Present Illness 86 y.o. male with past medical history significant for hypertension and mild dementia. He presented to the emergency department with sudden onset of slurred speech and aphasia, facial droop and weakness.   CT cerebral perfusion on 04/16/19 reported: "acute distal Left ACA territory ischemia and infarct."    PT Comments    Pt performed standing trials in stedy and modified mini squats in stedy.  He required facilitation of R hip and knee and cervical extension.  Continue to recommend aggressive CIR therapies.     Follow Up Recommendations  CIR;Supervision/Assistance - 24 hour     Equipment Recommendations  Other (comment)(TBD)    Recommendations for Other Services       Precautions / Restrictions Precautions Precautions: Fall Restrictions Weight Bearing Restrictions: No    Mobility  Bed Mobility Overal bed mobility: Needs Assistance Bed Mobility: Sidelying to Sit;Rolling Rolling: Mod assist Sidelying to sit: Mod assist       General bed mobility comments: Performed to R side of bed and able to reach with LUE and push with LLE.  Assistance to advance LEs to edge of bed and elevate trunk into a seated position.  Transfers   Equipment used: Ambulation equipment used(sara stedy) Transfers: Sit to/from Stand Sit to Stand: Mod assist;+2 physical assistance         General transfer comment: Pt required assistance to block R knee.  Assistance to faciltate cervical extension  Ambulation/Gait Ambulation/Gait assistance: (unable)               Stairs             Wheelchair Mobility    Modified Rankin (Stroke Patients Only) Modified Rankin (Stroke Patients Only) Pre-Morbid Rankin Score: No symptoms Modified Rankin: Severe disability     Balance Overall balance assessment: Needs assistance   Sitting  balance-Leahy Scale: Poor       Standing balance-Leahy Scale: Poor                              Cognition Arousal/Alertness: Awake/alert Behavior During Therapy: WFL for tasks assessed/performed Overall Cognitive Status: Difficult to assess                                 General Comments: global aphasia, appears to follow very simple 1 step commands with visual and tactile cues;pt smiling during session, attempted to make sounds;required max visual and tactile cues throughout session.      Exercises General Exercises - Lower Extremity Mini-Sqauts: AROM;Both;5 reps;Standing    General Comments        Pertinent Vitals/Pain Pain Assessment: Faces Pain Score: 0-No pain Faces Pain Scale: Hurts a little bit Pain Location: genralized with mobility, ROM RUE Pain Descriptors / Indicators: Grimacing Pain Intervention(s): Monitored during session;Repositioned    Home Living                      Prior Function            PT Goals (current goals can now be found in the care plan section) Acute Rehab PT Goals Patient Stated Goal: unable to state Potential to Achieve Goals: Good Progress towards PT goals: Progressing toward goals    Frequency    Min 4X/week  PT Plan Current plan remains appropriate    Co-evaluation              AM-PAC PT "6 Clicks" Mobility   Outcome Measure  Help needed turning from your back to your side while in a flat bed without using bedrails?: A Lot Help needed moving from lying on your back to sitting on the side of a flat bed without using bedrails?: A Lot Help needed moving to and from a bed to a chair (including a wheelchair)?: A Lot Help needed standing up from a chair using your arms (e.g., wheelchair or bedside chair)?: A Lot Help needed to walk in hospital room?: Total Help needed climbing 3-5 steps with a railing? : Total 6 Click Score: 10    End of Session Equipment Utilized During  Treatment: Gait belt Activity Tolerance: Patient tolerated treatment well Patient left: in chair;with chair alarm set;with call bell/phone within reach Nurse Communication: Mobility status PT Visit Diagnosis: Other abnormalities of gait and mobility (R26.89);Hemiplegia and hemiparesis Hemiplegia - Right/Left: Right Hemiplegia - caused by: Cerebral infarction     Time: 7341-9379 PT Time Calculation (min) (ACUTE ONLY): 18 min  Charges:  $Therapeutic Activity: 8-22 mins                     Bonney Leitz , PTA Acute Rehabilitation Services Pager [phone] Office [phone]     Kavaughn Faucett Artis Delay 04/23/2019, 5:53 PM

## 2019-04-23 NOTE — TOC Progression Note (Signed)
Transition of Care [facility]) - Progression Note    Patient Details  Name: Scott Gilbert MRN: [id number] Date of Birth: [birth year]  Transition [facility]) CM/SW Contact  Baldemar Lenis, [name] Phone Number: 04/23/2019, 4:36 PM  Clinical Narrative:   CSW following for discharge planning. CSW spoke with patient's daughter, Steward Drone, to provide her update on the barriers to placement. CSW updated Steward Drone [facility] that the [facility] is not [facility], and we are trying to see if the patient would qualify for anything else, including Medicaid. Steward Drone appreciative of update. CSW discussed with MD barriers to placement, including the patient having no payer source at this time as he is not service connected through the [place] and will need significant assistance at discharge due to his deficits. CSW to continue to follow.    Expected Discharge Plan: IP Rehab Facility Barriers to Discharge: Continued Medical Work up, Inadequate or no insurance  Expected Discharge Plan and Services Expected Discharge Plan: IP Rehab Facility       Living arrangements for the past 2 months: Single Family Home                                       Social Determinants of Health (SDOH) Interventions    Readmission Risk Interventions No flowsheet data found.

## 2019-04-23 NOTE — TOC Progression Note (Signed)
Transition of Care [facility]) - Progression Note    Patient Details  Name: Scott Gilbert MRN: [id number] Date of Birth: 14-Dec-1929  Transition of Care [facility]) CM/SW Contact  Baldemar Lenis, [name] Phone Number: 04/23/2019, 4:34 PM  Clinical Narrative:   CSW received a call from Vernona Rieger, patient's social worker at the [place]. Vernona Rieger confirmed that patient is not service connected, so he does not qualify for any post-acute rehab care through the [place]. Patient has no other insurance. Laura's suggestion was for the family to see if he would qualify for Medicaid, as that would assist with covering long term care if he were to need that, as it sounds like he may given the severity of his deficits from the stroke. Vernona Rieger provided her number if she could be of any other assistance. CSW reached out to financial counseling office to see if they would be able to assist the patient, as he does not have any insurance coverage at this time as he is not service connected through the [place]. CSW updated Gala Romney, and [employer] appreciative of information; agreeable to be contacted for Medicaid screening. Awaiting response from financial counseling. CSW to follow.    Expected Discharge Plan: IP Rehab Facility Barriers to Discharge: Continued Medical Work up, Inadequate or no insurance  Expected Discharge Plan and Services Expected Discharge Plan: IP Rehab Facility       Living arrangements for the past 2 months: Single Family Home                                       Social Determinants of Health (SDOH) Interventions    Readmission Risk Interventions No flowsheet data found.

## 2019-04-24 NOTE — Progress Notes (Signed)
Palliative Medicine Inpatient Follow Up Note   HPI: Per intake H&P --> Scott Gilbert a 86 y.o.malewith past medical history significant for hypertension, mild dementia presents to the emergency department with sudden onset of slurred speech and aphasia, facial droop and weakness.  According to the patient's daughter,patient was his normal self when around 9 PM family witnessed patient suddenly having slurred speech, however facial droop and weak. EMS was called their assessment patient having left gaze preference with right-sided neglect, right hemiparesis and aphasia. Blood pressure was 726 systolic. Arrived to [name] ED as a code stroke.  On arrival,patient was taken medially to CT scan. Stat CT head was obtained which showed significant atrophy but no acute ischemic changes/hemorrhage. Patient was administered IV TPA after discussing risk versus benefit with daughter. CT angiogram was obtained which showed no large vessel occlusion however so severe intracranial atherosclerotic disease. As patient continued to have significant deficits despite IV TPA administration, CT perfusion was performed to assess if there was a thrombus that could have been missed on CTA. CT perfusion demonstrated core/penumbra in the ACA territory with possible occlusion of the A2 segment,not a candidate for thrombectomy due to distal site of occlusion  Past medical records are not available as patient seeks medical care at the [place].  Today's Discussion (04/24/2019): Chart reviewed. Met with Scott Gilbert at bedside. He was able to track me around the room and mumble some words. He was in no distress.   Physical therapy is planned to work with Scott Gilbert today.   Called Hassan Rowan to provide a daily update and follow up on yesterdays conversation though she did not respond. Will follow up later in the day.  At the present time the most prevailing preclusion to possible discharge is the patients [employer]  insurance. They are unwilling to compensate a SNF stay. Patients daughter, Hassan Rowan who he lives with is unable to assure 24/7 caregiver. Per discussion with social work medicaid will be filed for. In the meanwhile it is unclear where Scott Gilbert will transition to.   Vital Signs Vitals:   04/24/19 0700 04/24/19 1124  BP: (!) 178/62 (!) 152/67  Pulse: 83 76  Resp: 19 17  Temp: (!) 97.5 F (36.4 C) 98.6 F (37 C)  SpO2: 92% 94%    Intake/Output Summary (Last 24 hours) at 04/24/2019 1251 Last data filed at 04/24/2019 0900 Gross per 24 hour  Intake 120 ml  Output 1150 ml  Net -1030 ml   Last Weight  Most recent update: 04/17/2019  4:28 AM   Weight  62.3 kg (137 lb 5.6 oz)           Physical Exam Vitals and nursing note reviewed.  HENT:     Head: Normocephalic.     Nose: Nose normal.     Mouth/Throat:     Mouth: Mucous membranes are dry.  Eyes:     Pupils: Pupils are equal, round, and reactive to light.  Cardiovascular:     Rate and Rhythm: Tachycardia present.     Pulses: Normal pulses.  Pulmonary:     Effort: Pulmonary effort is normal.  Abdominal:     Palpations: Abdomen is soft.  Musculoskeletal:     Cervical back: Normal range of motion.  Skin:    General: Skin is warm.     Capillary Refill: Capillary refill takes less than 2 seconds.  Neurological:     Mental Status: He is alert.     Comments: Global apahasia  Psychiatric:        Speech: He is able to mumble slightly more today though does not make sense  SUMMARY OF RECOMMENDATIONS   Full Code / Full scope  TOC - Outpatient referral to [facility] - complicated discharge d/t lack of insurance coverage  Symptom Management:  Muscular Weakness in the setting of Left ACA Infarct: - PT - OT Dysphagia:                  - Speech consult completed, passed evaluation                 - D2 Diet w. thins Xerostomia: - Good oral care QShift -  Encourage liquid intake Constipation:                 - Senna S BID Delirium Precuations:  - Get up during the day - Encourage a familiar face to remain present throughout the day - Keep blinds open and lights on during daylight hours - Minimize the use of opioids/benzodiazepines Spiritual: - Chaplain consult  Social:                 - Appreciate SW help. Patient has [employer] though is not service connected therefore cannot go to SNF.   Time Spent: 15 Greater than 50% of the time was spent in counseling and coordination of care ______________________________________________________________________________________ [place] Team Team Cell Phone: [phone] Please utilize secure chat with additional questions, if there is no response within 30 minutes please call the above phone number  Palliative Medicine Team providers are available by phone from 7am to 7pm daily and can be reached through the team cell phone.  Should this patient require assistance outside of these hours, please call the patient's attending physician.

## 2019-04-24 NOTE — Progress Notes (Signed)
STROKE TEAM PROGRESS NOTE   INTERVAL HISTORY Patient sitting up in a bedside chair. Palliative care Np at bedside He continues tol visually track you and follow only occasional pantomime command but is  globally aphasic but trying to speak a few words now but speech is unintelligible.. I spoke to his daughter y`day who is HPOA. The patient does not have insurance to go to nursing home andfamily is unable to take him home and provide 24 hr care.  OBJECTIVE Vitals:   04/23/19 2353 04/24/19 0424 04/24/19 0700 04/24/19 1124  BP: (!) 155/56 (!) 149/62 (!) 178/62 (!) 152/67  Pulse: 77 83 83 76  Resp: 17 18 19 17   Temp: 98.3 F (36.8 C) 98.2 F (36.8 C) (!) 97.5 F (36.4 C) 98.6 F (37 C)  TempSrc: Oral Oral Oral Oral  SpO2: 95% 99% 92% 94%  Weight:        CBC:  Recent Labs  Lab 04/21/19 0216 04/22/19 0449  WBC 8.1 8.2  HGB 12.1* 11.3*  HCT 35.7* 32.6*  MCV 93.2 92.4  PLT 189 532    Basic Metabolic Panel:  Recent Labs  Lab 04/21/19 0216 04/22/19 0449  NA 138 137  K 4.1 3.7  CL 107 104  CO2 22 22  GLUCOSE 135* 127*  BUN 19 24*  CREATININE 1.34* 1.24  CALCIUM 8.2* 8.3*    IMAGING past 24h No results found.   PHYSICAL EXAM     General - Well nourished, well developed elderly caucasian male, in no apparent distress.  Bruising around right knee and significant ecchymosis on left arm around IV site  Ophthalmologic - fundi not visualized due to noncooperation.  Cardiovascular - Regular rhythm and rate.  Neuro - awake alert, making eye contact, tracking bilaterally.  Global aphasia, did   follow only simple midline and pantomime commands on left side, no speech output.  No gaze preference, blinking to visual threat bilaterally. Right facial droop.  Tongue midline hemolysis.  Left upper extremity at least 3/5, left lower extremity at least 3-/5.  Right upper and lower extremity mild withdrawal on pain stimulation.  DTR 1+, no Babinski. Sensation, coordination and gait not  tested.   ASSESSMENT/PLAN Scott Gilbert is a 86 y.o. male with history of hypertension and  mild dementia (medical care at [facility]), presents to the emergency department with sudden onset of slurred speech, aphasia, left gaze preference with right-sided neglect, right hemiparesis, facial droop and weakness. He received IV t-PA Friday 04/16/19 at 2230.  Stroke: Left ACA territory infarct, likely due to large vessel disease although cardioembolic source not excluded  Code Stroke CT Head - No acute intracranial abnormality, multiple remote lacunar infarcts involving the bilateral basal ganglia, with chronic left cerebellar infarcts.   MRI head - Moderate-sized acute ischemic nonhemorrhagic left ACA territory infarct  CTA H&N - severe distal Left ICA siphon stenosis due to bulky calcified plaque, multifocal Left MCA irregularity and stenoses, up to severe stenoses of both distal VAs, and the Left PCA. bilateral MCA M3, and Left ACA A2 severe stenoses.   EEG - cortical dysfunction in left frontotemporal region, no seizures.  2D Echo - EF 70-75%. No intracardiac source of embolism   LE venous doppler  Negative for DVT   Consider 30 day cardiac event monitoring as outpt  [employer] Virus 2 - negative  LDL - 113  HgbA1c - 5.3  UDS - negative  VTE prophylaxis - lovenox  No antithrombotic prior to admission, now on  ASA 325 and plavix DAPT for 3 months and then ASA alone given significant intracranial stenosis  Ongoing aggressive stroke risk factor management  Therapy recommendations: CIR->SNF   Disposition:  Pending - pt without CIR or SNF coverage - SW involved in working with [facility] and durable POA to determine a course  Medically ready for d/c once d/c plan determined. Transitioned to every shift evaluations. D/c tele.  Palliative consult at request of TOC LOS rounds. Consult 04/23/19 - Full Code  Social Worker evaluating discharge options - goal would be some type of inpatient rehab  facility  Hypertension  Home BP meds: Zestril 2.5  Current BP meds: lisinopril 5  BP stable . Permissive hypertension (OK if < 180/105) but gradually normalize in 5-7 days  . Long-term BP goal normotensive  Hyperlipidemia  Home Lipid lowering medication: none  LDL 113, goal < 70  Current lipid lowering medication: on lipitor 40  Continue statin at discharge  Dysphagia . Secondary to stroke . On D1 thin liquids, meds crushed in puree . Speech on board  Other Stroke Risk Factors  Advanced age  Hx stroke/TIA - by imaging  Other Active Problems  CKD - stage 3a, Cre 1.24   Dementia - on galantamine and Risperdal   Fall - right knee bruise  Hospital day # 8 Patient is medically stable to be discharged to nursing home but unfortunately he does not have insurance and Child psychotherapist is working on family dynamics to look at alternatives [name], MD   To contact Stroke Continuity provider, please refer to [email or web address]. After hours, contact General Neurology

## 2019-04-25 NOTE — Progress Notes (Signed)
STROKE TEAM PROGRESS NOTE   INTERVAL HISTORY Patient sitting up in  bedside chair. His sitter is at his bedside.He continues tol visually track you and follow only occasional pantomime command but is  globally aphasic but trying to speak a few words now but speech is unintelligible.. Vital signs are stable.  No neurological changes. OBJECTIVE Vitals:   04/25/19 0001 04/25/19 0346 04/25/19 0812 04/25/19 1129  BP: (!) 148/61 137/64 (!) 154/71 (!) 160/82  Pulse: 81 79 74 80  Resp: 18 18 18 19   Temp: 98.2 F (36.8 C) 98.3 F (36.8 C) 98.6 F (37 C) 98.6 F (37 C)  TempSrc: Oral Oral Oral Oral  SpO2: 97% 96% 95% 97%  Weight:        CBC:  Recent Labs  Lab 04/21/19 0216 04/22/19 0449  WBC 8.1 8.2  HGB 12.1* 11.3*  HCT 35.7* 32.6*  MCV 93.2 92.4  PLT 189 187    Basic Metabolic Panel:  Recent Labs  Lab 04/21/19 0216 04/22/19 0449  NA 138 137  K 4.1 3.7  CL 107 104  CO2 22 22  GLUCOSE 135* 127*  BUN 19 24*  CREATININE 1.34* 1.24  CALCIUM 8.2* 8.3*    IMAGING past 24h No results found.   PHYSICAL EXAM     General - Well nourished, well developed elderly caucasian male, in no apparent distress.  Bruising around right knee and significant ecchymosis on left arm around IV site  Ophthalmologic - fundi not visualized due to noncooperation.  Cardiovascular - Regular rhythm and rate.  Neuro - awake alert, making eye contact, tracking bilaterally.  Global aphasia, did   follow only simple midline and pantomime commands on left side, no speech output.  No gaze preference, blinking to visual threat bilaterally. Right facial droop.  Tongue midline hemolysis.  Left upper extremity at least 3/5, left lower extremity at least 3-/5.  Right upper and lower extremity mild withdrawal on pain stimulation.  DTR 1+, no Babinski. Sensation, coordination and gait not tested.   ASSESSMENT/PLAN [name] is a 86 y.o. male with history of hypertension and  mild dementia (medical  care at [facility]), presents to the emergency department with sudden onset of slurred speech, aphasia, left gaze preference with right-sided neglect, right hemiparesis, facial droop and weakness. He received IV t-PA Friday 04/16/19 at 2230.  Stroke: Left ACA territory infarct, likely due to large vessel disease although cardioembolic source not excluded  Code Stroke CT Head - No acute intracranial abnormality, multiple remote lacunar infarcts involving the bilateral basal ganglia, with chronic left cerebellar infarcts.   MRI head - Moderate-sized acute ischemic nonhemorrhagic left ACA territory infarct  CTA H&N - severe distal Left ICA siphon stenosis due to bulky calcified plaque, multifocal Left MCA irregularity and stenoses, up to severe stenoses of both distal VAs, and the Left PCA. bilateral MCA M3, and Left ACA A2 severe stenoses.   EEG - cortical dysfunction in left frontotemporal region, no seizures.  2D Echo - EF 70-75%. No intracardiac source of embolism   LE venous doppler  Negative for DVT   Consider 30 day cardiac event monitoring as outpt  04/18/19 Virus 2 - negative  LDL - 113  HgbA1c - 5.3  UDS - negative  VTE prophylaxis - lovenox  No antithrombotic prior to admission, now on ASA 325 and plavix DAPT for 3 months and then ASA alone given significant intracranial stenosis  Ongoing aggressive stroke risk factor management  Therapy recommendations: CIR->SNF  Disposition:  Pending - pt without CIR or SNF coverage - SW involved in working with [facility] and durable POA to determine a course  Medically ready for d/c once d/c plan determined. Transitioned to every shift evaluations. D/c tele.  Palliative consult at request of TOC LOS rounds. Consult 04/23/19 - Full Code  Social Worker evaluating discharge options - goal would be some type of inpatient rehab facility  Hypertension  Home BP meds: Zestril 2.5  Current BP meds: lisinopril 5  BP stable . Permissive  hypertension (OK if < 180/105) but gradually normalize in 5-7 days  . Long-term BP goal normotensive  Hyperlipidemia  Home Lipid lowering medication: none  LDL 113, goal < 70  Current lipid lowering medication: on lipitor 40  Continue statin at discharge  Dysphagia . Secondary to stroke . On D1 thin liquids, meds crushed in puree . Speech on board  Other Stroke Risk Factors  Advanced age  Hx stroke/TIA - by imaging  Other Active Problems  CKD - stage 3a, Cre 1.24   Dementia - on galantamine and Risperdal   Fall - right knee bruise  Check labs Monday  Hospital day # 9 Patient is medically stable to be discharged to nursing home but unfortunately he does not have insurance and social worker is working on family dynamics to look at alternatives Antony Contras, MD   To contact Stroke Continuity provider, please refer to [email or web address] After hours, contact General Neurology

## 2019-04-26 DIAGNOSIS — R1312 Dysphagia, oropharyngeal phase: Secondary | ICD-10-CM

## 2019-04-26 DIAGNOSIS — I63522 Cerebral infarction due to unspecified occlusion or stenosis of left anterior cerebral artery: Principal | ICD-10-CM

## 2019-04-26 LAB — BASIC METABOLIC PANEL
Anion gap: 8 (ref 5–15)
BUN: 31 mg/dL — ABNORMAL HIGH (ref 8–23)
CO2: 22 mmol/L (ref 22–32)
Calcium: 8.2 mg/dL — ABNORMAL LOW (ref 8.9–10.3)
Chloride: 103 mmol/L (ref 98–111)
Creatinine, Ser: 1.34 mg/dL — ABNORMAL HIGH (ref 0.61–1.24)
GFR calc Af Amer: 55 mL/min — ABNORMAL LOW (ref >60)
GFR calc non Af Amer: 47 mL/min — ABNORMAL LOW (ref >60)
Glucose, Bld: 122 mg/dL — ABNORMAL HIGH (ref 70–99)
Potassium: 4.2 mmol/L (ref 3.5–5.1)
Sodium: 133 mmol/L — ABNORMAL LOW (ref 135–145)

## 2019-04-26 LAB — CBC
HCT: 33.3 % — ABNORMAL LOW (ref 39.0–52.0)
Hemoglobin: 11.5 g/dL — ABNORMAL LOW (ref 13.0–17.0)
MCH: 31.8 pg (ref 26.0–34.0)
MCHC: 34.5 g/dL (ref 30.0–36.0)
MCV: 92 fL (ref 80.0–100.0)
Platelets: 266 10*3/uL (ref 150–400)
RBC: 3.62 MIL/uL — ABNORMAL LOW (ref 4.22–5.81)
RDW: 11.7 % (ref 11.5–15.5)
WBC: 6.4 10*3/uL (ref 4.0–10.5)
nRBC: 0 % (ref 0.0–0.2)

## 2019-04-26 MED ORDER — LISINOPRIL 10 MG PO TABS
10.0000 mg | ORAL_TABLET | Freq: Every day | ORAL | Status: DC
  Administered 2019-04-27: 09:00:00 10 mg via ORAL
  Filled 2019-04-26: qty 1

## 2019-04-26 NOTE — Progress Notes (Signed)
I responded to spiritual care consult for pastoral support. Pt was resting with his eyes open. Scott Gilbert seemed alert. He followed me with his eyes, but was not communicating. I offered him spiritual care with ministry of presence, sacred text reading, words of comfort and silent prayer. I did notice him tearing up. Will continue to follow up as needed.   Palliative Care Chaplain  Resident Orest Dikes  [phone]

## 2019-04-26 NOTE — Progress Notes (Signed)
STROKE TEAM PROGRESS NOTE   INTERVAL HISTORY Patient sitting in chair, neuro stable.  Smiling to me on arrival.  Still has right arm flaccid, right leg strength improving.  Still has global aphasia but able to follow commands on my opening/closure then perseverated.  OBJECTIVE Vitals:   04/25/19 2335 04/26/19 0329 04/26/19 0816 04/26/19 1311  BP: (!) 160/69 138/60 (!) 154/55 (!) 145/65  Pulse: 81 77 77 69  Resp: 17 17 14 18   Temp: 98.6 F (37 C) 98.7 F (37.1 C) 98.2 F (36.8 C) 98.8 F (37.1 C)  TempSrc: Oral Oral Oral Oral  SpO2: 94% 94% 96% 97%  Weight:       CBC:  Recent Labs  Lab 04/22/19 0449 04/26/19 0557  WBC 8.2 6.4  HGB 11.3* 11.5*  HCT 32.6* 33.3*  MCV 92.4 92.0  PLT 187 502   Basic Metabolic Panel:  Recent Labs  Lab 04/22/19 0449 04/26/19 0557  NA 137 133*  K 3.7 4.2  CL 104 103  CO2 22 22  GLUCOSE 127* 122*  BUN 24* 31*  CREATININE 1.24 1.34*  CALCIUM 8.3* 8.2*    IMAGING past 24h No results found.   PHYSICAL EXAM     General - Well nourished, well developed elderly caucasian male, in no apparent distress.    Ophthalmologic - fundi not visualized due to noncooperation.  Cardiovascular - Regular rhythm and rate.  Neuro - awake alert, making eye contact, tracking bilaterally. Global aphasia, did follow limited simple midline commands but then perseverated on eye opening/closure, and pantomime commands on left side, no speech output.  No gaze preference, blinking to visual threat bilaterally. Right facial droop.  Tongue midline hemolysis.  Left upper extremity 4/5, left lower extremity at least 3-/5.  Right upper extremity 0/5 with increased muscle tone, and right lower extremity mild withdrawal on pain stimulation 2+/5.  No Babinski. Sensation, coordination and gait not tested.   ASSESSMENT/PLAN [name] is a 86 y.o. male with history of hypertension and  mild dementia (medical care at [facility]), presents to the emergency department with  sudden onset of slurred speech, aphasia, left gaze preference with right-sided neglect, right hemiparesis, facial droop and weakness. He received IV t-PA Friday 04/16/19 at 2230.  Stroke: Left ACA territory infarct, likely due to large vessel disease although cardioembolic source not excluded  Code Stroke CT Head - No acute intracranial abnormality, multiple remote lacunar infarcts involving the bilateral basal ganglia, with chronic left cerebellar infarcts.   MRI head - Moderate-sized acute ischemic nonhemorrhagic left ACA territory infarct  CTA H&N - severe distal Left ICA siphon stenosis due to bulky calcified plaque, multifocal Left MCA irregularity and stenoses, up to severe stenoses of both distal VAs, and the Left PCA. bilateral MCA M3, and Left ACA A2 severe stenoses.   EEG - cortical dysfunction in left frontotemporal region, no seizures.  2D Echo - EF 70-75%. No intracardiac source of embolism   LE venous doppler  Negative for DVT   Consider 30 day cardiac event monitoring as outpt  [employer] Virus 2 - negative  LDL - 113  HgbA1c - 5.3  UDS - negative  VTE prophylaxis - lovenox  No antithrombotic prior to admission, now on ASA 325 and plavix DAPT for 3 months and then ASA alone given significant intracranial stenosis  Ongoing aggressive stroke risk factor management  Therapy recommendations: CIR->SNF   Disposition:  Pending - pt without CIR or SNF coverage   Medically ready for  d/c once d/c plan determined.   Palliative consult consult 04/23/19 - Full Code - plan OP referral to Hospice  Social Worker evaluating discharge options - goal would be some type of inpatient rehab facility. SW working with [facility] and durable POA to determine a course  Hypertension  Home BP meds: Zestril 2.5  increase lisinopril to 10mg   BP stable on the high end . Long-term BP goal normotensive  Hyperlipidemia  Home Lipid lowering medication: none  LDL 113, goal < 70  Current  lipid lowering medication: on lipitor 40  Continue statin at discharge  Dysphagia . Secondary to stroke . Now D1 thin liquids . Speech on board  Other Stroke Risk Factors  Advanced age  Hx stroke/TIA - by imaging  Other Active Problems  CKD - stage 3a, Cre 1.34->1.24->1.34  Dementia - on galantamine and Risperdal   Fall - right knee bruise  Hospital day # 10  04-13-1968, MD PhD Stroke Neurology 04/26/2019 6:26 PM   To contact Stroke Continuity provider, please refer to 06/24/2019. After hours, contact General Neurology

## 2019-04-26 NOTE — Progress Notes (Signed)
Physical Therapy Treatment Patient Details Name: Scott Gilbert MRN: [id number] DOB: [birth year] Today's Date: 04/26/2019    History of Present Illness 86 y.o. male with past medical history significant for hypertension and mild dementia. He presented to the emergency department with sudden onset of slurred speech and aphasia, facial droop and weakness.   CT cerebral perfusion on 04/16/19 reported: "acute distal Left ACA territory ischemia and infarct."    PT Comments    Pt alert and interactive throughout session. Did not attempt to verbalize much but does smile or laugh occasionally when he seems to want to offer an affirmative answer. Mod A for bed mobility. Worked on sitting balance EOB with reaching activities to L. Can maintain sitting with min A when trunk engaged, loss of balance to R when pt gets distracted or fatigued. +2 mod A to stand from bed but performed sit<>stand in stedy from higher surface and was able to stand with min A +2. PT will continue to follow.    Follow Up Recommendations  CIR;Supervision/Assistance - 24 hour     Equipment Recommendations  Other (comment)(TBD)    Recommendations for Other Services Rehab consult     Precautions / Restrictions Precautions Precautions: Fall Restrictions Weight Bearing Restrictions: No    Mobility  Bed Mobility Overal bed mobility: Needs Assistance Bed Mobility: Sidelying to Sit;Rolling Rolling: Mod assist Sidelying to sit: Min assist       General bed mobility comments: Mod A for LE's off bed and to guide L hand to R rail but then pt able to initiate trunk elevation with min A to come to full, upright.    Transfers Overall transfer level: Needs assistance Equipment used: Ambulation equipment used(sara stedy) Transfers: Sit to/from [employer] Sit to Stand: Mod assist;+2 physical assistance Stand pivot transfers: Total assist       General transfer comment: mod A +2 to stand in stedy from bed  but then stood several more times from flaps of stedy and was able to do so with min A +2. Lacking R knee control. Able to grasp stedy with R hand when hand guided to bar.   Ambulation/Gait             General Gait Details: unable but was able to reposition feet minimally in stedy   Stairs             Wheelchair Mobility    Modified Rankin (Stroke Patients Only) Modified Rankin (Stroke Patients Only) Pre-Morbid Rankin Score: No symptoms Modified Rankin: Severe disability     Balance Overall balance assessment: Needs assistance Sitting-balance support: Single extremity supported;Feet supported Sitting balance-Leahy Scale: Poor Sitting balance - Comments: worked on reaching to L in sitting, able to maintain sitting with min A when performing these activities. Without facilitation, loss of balance to R with any other body mvmt.  Postural control: Right lateral lean Standing balance support: Bilateral upper extremity supported;During functional activity Standing balance-Leahy Scale: Poor Standing balance comment: reliant on external support and +2                             Cognition Arousal/Alertness: Awake/alert Behavior During Therapy: WFL for tasks assessed/performed Overall Cognitive Status: Difficult to assess                                 General Comments: global aphasia, appears to follow very  simple 1 step commands with visual and tactile cues;pt smiling during session and makes laughing sounds at times for affirmative answer. When cued to look up, began to stand up.       Exercises      General Comments General comments (skin integrity, edema, etc.): pt alert and interactive throughout session      Pertinent Vitals/Pain Pain Assessment: Faces Faces Pain Scale: Hurts a little bit Pain Location: genralized with mobility Pain Descriptors / Indicators: Grimacing Pain Intervention(s): Monitored during session    Home Living                       Prior Function            PT Goals (current goals can now be found in the care plan section) Acute Rehab PT Goals Patient Stated Goal: unable to state PT Goal Formulation: Patient unable to participate in goal setting Time For Goal Achievement: 05/02/19 Potential to Achieve Goals: Fair Progress towards PT goals: Progressing toward goals    Frequency    Min 3X/week      PT Plan Current plan remains appropriate    Co-evaluation PT/OT/SLP Co-Evaluation/Treatment: Yes Reason for Co-Treatment: Complexity of the patient's impairments (multi-system involvement);For patient/therapist safety;Necessary to address cognition/behavior during functional activity PT goals addressed during session: Mobility/safety with mobility;Balance;Proper use of DME;Strengthening/ROM        AM-PAC PT "6 Clicks" Mobility   Outcome Measure  Help needed turning from your back to your side while in a flat bed without using bedrails?: A Lot Help needed moving from lying on your back to sitting on the side of a flat bed without using bedrails?: A Lot Help needed moving to and from a bed to a chair (including a wheelchair)?: A Lot Help needed standing up from a chair using your arms (e.g., wheelchair or bedside chair)?: A Lot Help needed to walk in hospital room?: Total Help needed climbing 3-5 steps with a railing? : Total 6 Click Score: 10    End of Session Equipment Utilized During Treatment: Gait belt Activity Tolerance: Patient tolerated treatment well Patient left: in chair;with chair alarm set;with call bell/phone within reach Nurse Communication: Mobility status PT Visit Diagnosis: Other abnormalities of gait and mobility (R26.89);Hemiplegia and hemiparesis Hemiplegia - Right/Left: Right Hemiplegia - caused by: Cerebral infarction     Time: 1610-9604 PT Time Calculation (min) (ACUTE ONLY): 25 min  Charges:  $Therapeutic Activity: 8-22 mins                      [place]  Pager [phone] Office [place] 04/26/2019, 1:46 PM

## 2019-04-26 NOTE — Progress Notes (Signed)
Occupational Therapy Treatment Patient Details Name: Keyontae Huckeby MRN: [id number] DOB: January 01, 1930 Today's Date: 04/26/2019    History of present illness 86 y.o. male with past medical history significant for hypertension and mild dementia. He presented to the emergency department with sudden onset of slurred speech and aphasia, facial droop and weakness.   CT cerebral perfusion on 04/16/19 reported: "acute distal Left ACA territory ischemia and infarct."   OT comments  Pt seen for skilled co-treatment with PT and pt agreeable and much more alert. Supine >sit with mod A. Sitting balance while engaged in grooming tasks with min - mod A and R lateral lean noted. Pt reaching and visually scanning to the L to obtain items from therapist and initiating weight shift. Pt washing face and brushing teeth with set up A. Pt standing into stedy with min +2 assist for safety x 4. OT educated and demonstrated to RN how to don R UE resting hand splint and discussed wearing schedule. Note left in room as well for other staff. Pt repositioned in recliner chair with chair alarm activated and all needs within reach. Pt continues to benefit from OT intervention.   Follow Up Recommendations  CIR;Supervision/Assistance - 24 hour    Equipment Recommendations  Other (comment)(defer to next venue of care)       Precautions / Restrictions Precautions Precautions: Fall Restrictions Weight Bearing Restrictions: No       Mobility Bed Mobility Overal bed mobility: Needs Assistance Bed Mobility: Sidelying to Sit;Rolling Rolling: Mod assist Sidelying to sit: Min assist Supine to sit: Mod assist;HOB elevated     General bed mobility comments: Mod A for LE's off bed and to guide L hand to R rail but then pt able to initiate trunk elevation with min A to come to full, upright.    Transfers Overall transfer level: Needs assistance Equipment used: Ambulation equipment used Transfers: Sit to/from [employer] Sit to Stand: Mod assist;+2 physical assistance Stand pivot transfers: Total assist       General transfer comment: mod A +2 to stand in stedy from bed but then stood several more times from flaps of stedy and was able to do so with min A +2. Lacking R knee control. Able to grasp stedy with R hand when hand guided to bar.     Balance Overall balance assessment: Needs assistance Sitting-balance support: Single extremity supported;Feet supported Sitting balance-Leahy Scale: Poor Sitting balance - Comments: worked on reaching to L in sitting, able to maintain sitting with min A when performing these activities. Without facilitation, loss of balance to R with any other body mvmt.  Postural control: Right lateral lean Standing balance support: Bilateral upper extremity supported;During functional activity Standing balance-Leahy Scale: Poor Standing balance comment: reliant on external support and +2           ADL either performed or assessed with clinical judgement   ADL Overall ADL's : Needs assistance/impaired     Grooming: Wash/dry face;Oral care;Set up;Sitting Grooming Details (indicate cue type and reason): Pt reaching to the L with L UE to obtain needed items and required min - mod A for sitting balance while completing task                      Cognition Arousal/Alertness: Awake/alert Behavior During Therapy: WFL for tasks assessed/performed Overall Cognitive Status: Difficult to assess        General Comments: global aphasia, appears to follow very simple 1 step commands  with visual and tactile cues;pt smiling during session and makes laughing sounds at times for affirmative answer. When cued to look up, began to stand up.               General Comments pt alert and interactive throughout session    Pertinent Vitals/ Pain       Pain Assessment: Faces Pain Score: 0-No pain Faces Pain Scale: Hurts a little bit Pain Location: genralized with  mobility Pain Descriptors / Indicators: Grimacing Pain Intervention(s): Monitored during session;Repositioned         Frequency  Min 2X/week        Progress Toward Goals  OT Goals(current goals can now be found in the care plan section)  Progress towards OT goals: Progressing toward goals  Acute Rehab OT Goals Patient Stated Goal: unable to state OT Goal Formulation: Patient unable to participate in goal setting  Plan Discharge plan remains appropriate    Co-evaluation    PT/OT/SLP Co-Evaluation/Treatment: Yes Reason for Co-Treatment: Complexity of the patient's impairments (multi-system involvement);For patient/therapist safety;To address functional/ADL transfers PT goals addressed during session: Mobility/safety with mobility;Balance;Proper use of DME;Strengthening/ROM OT goals addressed during session: ADL's and self-care      AM-PAC OT "6 Clicks" Daily Activity     Outcome Measure   Help from another person eating meals?: A Lot Help from another person taking care of personal grooming?: A Little Help from another person toileting, which includes using toliet, bedpan, or urinal?: A Lot Help from another person bathing (including washing, rinsing, drying)?: A Lot Help from another person to put on and taking off regular upper body clothing?: A Little Help from another person to put on and taking off regular lower body clothing?: A Lot 6 Click Score: 14    End of Session Equipment Utilized During Treatment: Gait belt  OT Visit Diagnosis: Unsteadiness on feet (R26.81);Muscle weakness (generalized) (M62.81)   Activity Tolerance Patient tolerated treatment well   Patient Left in chair;with call bell/phone within reach;with chair alarm set   Nurse Communication Mobility status        Time: 9379-0240 OT Time Calculation (min): 26 min  Charges: OT General Charges $OT Visit: 1 Visit OT Treatments $Self Care/Home Management : 8-22 mins  Darleen Crocker P  MS, OTR/L 04/26/2019, 3:20 PM

## 2019-04-26 NOTE — Progress Notes (Signed)
  Speech Language Pathology Treatment: Dysphagia  Patient Details Name: Scott Gilbert MRN: [id number] DOB: [birth year] Today's Date: 04/26/2019 Time: 1324-4010 SLP Time Calculation (min) (ACUTE ONLY): 21 min  Assessment / Plan / Recommendation Clinical Impression  Patient received sitting in chair, on room air. Session targeting dysphagia, PO tolerance. Per RN, patient noted to have significant coughing while being fed breakfast (dysphagia 2/thin liquids) by NT this morning. Feeding stopped, medical team notified and patient made NPO pending visit from ST. Patient participated in [facility] 04/23/19 indicating mild oral dysphagia, no penetration/aspiration appreciated during that exam. MBSS remarkable for prolonged mastication.  ST completed oral care (patient edentulous), cleaned and placed upper dentures. However, upper dentures very ill-fitting/loose, ST removed.  Pt seen with trials of thin liquids (water) via spoon sips, cup sips and straw sips. Pt unable to orally grasp/siphon with straw. No overt s/sx aspiration observed with thin liquids, despite thorough challenging. Patient noted to have perseverative lingual pumping at rest, with some tongue deviation to the right.  Patient seen with puree solids (applesauce and jello): grossly adequate oral acceptance and oral transfer, swallow initiation appeared timely and good clearance achieved. No overt s/sx aspiration observed.  D/w RN, RN reports she is unsure if patient was wearing upper dentures while consuming breakfast meal.   Recommend dysphagia 1 solids/thin liquids. Avoid straws. Feed patient when alert/upright, small bites/sips, frequent oral care. Remove dentures when patient is eating. ST to follow acutely, will plan to trial tolerance of upgraded solids.   Secure chat initiated with MD re: diet recommendations.   HPI HPI: Scott Gilbert is a 86 y.o. male with past medical history significant for hypertension, mild dementia presented to the  emergency department with sudden onset of slurred speech and aphasia, facial droop and weakness.   CT cerebral perfusion on 04/16/19 reported: "acute distal Left ACA territory ischemia and infarct."       SLP Plan  Continue with current plan of care       Recommendations  Diet recommendations: Dysphagia 1 (puree);Thin liquid Liquids provided via: Cup;Teaspoon;No straw Medication Administration: Crushed with puree Supervision: Full supervision/cueing for compensatory strategies Compensations: Minimize environmental distractions;Slow rate;Small sips/bites Postural Changes and/or Swallow Maneuvers: Seated upright 90 degrees                Oral Care Recommendations: Oral care BID;Staff/trained caregiver to provide oral care Follow up Recommendations: 24 hour supervision/assistance;Skilled Nursing facility SLP Visit Diagnosis: Dysphagia, oral phase (R13.11) Plan: Continue with current plan of care       GO              Shella Spearing, M.Ed., CCC-SLP Speech Therapy Acute Rehabilitation [phone]: Acute Rehab office [phone] - pager    [name] 04/26/2019, 3:15 PM

## 2019-04-27 ENCOUNTER — Other Ambulatory Visit: Payer: Self-pay

## 2019-04-27 MED ORDER — LISINOPRIL 20 MG PO TABS
20.0000 mg | ORAL_TABLET | Freq: Every day | ORAL | Status: DC
  Administered 2019-04-28 – 2019-05-21 (×24): 20 mg via ORAL
  Filled 2019-04-27 (×24): qty 1

## 2019-04-27 NOTE — Progress Notes (Signed)
Physical Therapy Treatment Patient Details Name: Scott Gilbert MRN: [id number] DOB: 08/09/1929 Today's Date: 04/27/2019    History of Present Illness 86 y.o. male with past medical history significant for hypertension and mild dementia. He presented to the emergency department with sudden onset of slurred speech and aphasia, facial droop and weakness.   CT cerebral perfusion on 04/16/19 reported: "acute distal Left ACA territory ischemia and infarct."    PT Comments    Pt following more one step commands appropriately during session today and showing increased awareness of R side. Worked on sit<>stand transfers and standing balance for pregait. Pt practiced standing to hemiwalker with support of LUE and mod A. PT will continue to follow.     Follow Up Recommendations  CIR;Supervision/Assistance - 24 hour     Equipment Recommendations  Other (comment)(TBD)    Recommendations for Other Services Rehab consult     Precautions / Restrictions Precautions Precautions: Fall Restrictions Weight Bearing Restrictions: No    Mobility  Bed Mobility Overal bed mobility: Needs Assistance Bed Mobility: Sidelying to Sit;Rolling Rolling: Min assist Sidelying to sit: Min assist       General bed mobility comments: min A to initiate L hand to R rail, pt able to grasp rail and pull self to it. Min A to initiate LE's off bed but pt able to complete task. Min A for elevation of trunk into sitting  Transfers Overall transfer level: Needs assistance Equipment used: Hemi-walker;None Transfers: Sit to/from [employer] Sit to Stand: Mod assist;+2 safety/equipment Stand pivot transfers: Max assist;+2 safety/equipment       General transfer comment: with R knee blocked, pt able to stand to therapist with mod A for power up. Pt able to maintain standing 3 mins x2 for bowel clean up. Pt pivoted to recliner on L, was able to reach for far arm with LUE when cued. Max A for stepping  feet toward chair and RLE blocked. Also practiced sit<>stand from recliner to hemiwalker on L  Ambulation/Gait             General Gait Details: pregait activities performed in standing   Stairs             Wheelchair Mobility    Modified Rankin (Stroke Patients Only) Modified Rankin (Stroke Patients Only) Pre-Morbid Rankin Score: No symptoms Modified Rankin: Severe disability     Balance Overall balance assessment: Needs assistance Sitting-balance support: Single extremity supported;Feet supported Sitting balance-Leahy Scale: Fair Sitting balance - Comments: pt maintained sitting EOB with supervision today   Standing balance support: Bilateral upper extremity supported;During functional activity Standing balance-Leahy Scale: Poor Standing balance comment: reliant on external support and mod A                            Cognition Arousal/Alertness: Awake/alert Behavior During Therapy: WFL for tasks assessed/performed Overall Cognitive Status: Difficult to assess                                 General Comments: pt engaged throughout session. Did not attempt to verbalize today but did laugh occasionally      Exercises      General Comments General comments (skin integrity, edema, etc.): pt had bowel mvmt in bed before session, cleaned up and donned clean gown with asisst from rehab techs and NT      Pertinent Vitals/Pain Pain Assessment:  Faces Faces Pain Scale: No hurt    Home Living                      Prior Function            PT Goals (current goals can now be found in the care plan section) Acute Rehab PT Goals Patient Stated Goal: unable to state PT Goal Formulation: Patient unable to participate in goal setting Time For Goal Achievement: 05/02/19 Potential to Achieve Goals: Fair Progress towards PT goals: Progressing toward goals    Frequency    Min 3X/week      PT Plan Current plan remains  appropriate    Co-evaluation              AM-PAC PT "6 Clicks" Mobility   Outcome Measure  Help needed turning from your back to your side while in a flat bed without using bedrails?: A Little Help needed moving from lying on your back to sitting on the side of a flat bed without using bedrails?: A Lot Help needed moving to and from a bed to a chair (including a wheelchair)?: A Lot Help needed standing up from a chair using your arms (e.g., wheelchair or bedside chair)?: A Lot Help needed to walk in hospital room?: Total Help needed climbing 3-5 steps with a railing? : Total 6 Click Score: 11    End of Session Equipment Utilized During Treatment: Gait belt Activity Tolerance: Patient tolerated treatment well Patient left: in chair;with chair alarm set;with call bell/phone within reach Nurse Communication: Mobility status PT Visit Diagnosis: Other abnormalities of gait and mobility (R26.89);Hemiplegia and hemiparesis Hemiplegia - Right/Left: Right Hemiplegia - caused by: Cerebral infarction     Time: 1110-1141 PT Time Calculation (min) (ACUTE ONLY): 31 min  Charges:  $Gait Training: 8-22 mins $Therapeutic Activity: 8-22 mins                     Leighton Roach, PT  Acute Rehab Services  Pager [phone] Office [place] 04/27/2019, 1:31 PM

## 2019-04-27 NOTE — Progress Notes (Signed)
STROKE TEAM PROGRESS NOTE   INTERVAL HISTORY Pt sitting in chair. Awake alert still aphasic. Neuro stable. Pending placement.   OBJECTIVE Vitals:   04/26/19 1917 04/26/19 2308 04/27/19 0309 04/27/19 0838  BP: (!) 156/68 (!) 132/52 (!) 155/65 (!) 159/65  Pulse: 76 77  81  Resp: 20 20 20 18   Temp: 97.8 F (36.6 C) 98.5 F (36.9 C) 98.2 F (36.8 C) 97.6 F (36.4 C)  TempSrc: Oral Oral Oral Oral  SpO2: 98% 95% 98% 93%  Weight:       CBC:  Recent Labs  Lab 04/22/19 0449 04/26/19 0557  WBC 8.2 6.4  HGB 11.3* 11.5*  HCT 32.6* 33.3*  MCV 92.4 92.0  PLT 187 948   Basic Metabolic Panel:  Recent Labs  Lab 04/22/19 0449 04/26/19 0557  NA 137 133*  K 3.7 4.2  CL 104 103  CO2 22 22  GLUCOSE 127* 122*  BUN 24* 31*  CREATININE 1.24 1.34*  CALCIUM 8.3* 8.2*    IMAGING past 24h No results found.   PHYSICAL EXAM   General - Well nourished, well developed elderly caucasian male, in no apparent distress.    Ophthalmologic - fundi not visualized due to noncooperation.  Cardiovascular - Regular rhythm and rate.  Neuro - awake alert, making eye contact, tracking bilaterally. Global aphasia, did follow eye opening and close but then perseverated on that for any further commands. He is able to pantomime commands on left UE, no speech output.  No gaze preference, blinking to visual threat bilaterally. Right facial droop.  Tongue midline hemolysis.  Left upper extremity 4/5, left lower extremity at least 3-/5.  Right upper extremity 0/5 with increased muscle tone, and right lower extremity mild withdrawal on pain stimulation 2+/5.  No Babinski. Sensation, coordination and gait not tested.   ASSESSMENT/PLAN Scott Gilbert is a 86 y.o. male with history of hypertension and  mild dementia (medical care at [facility]), presents to the emergency department with sudden onset of slurred speech, aphasia, left gaze preference with right-sided neglect, right hemiparesis, facial droop and  weakness. He received IV t-PA Friday 04/16/19 at 2230.  Stroke: Left ACA territory infarct, likely due to large vessel disease although cardioembolic source not excluded  Code Stroke CT Head - No acute intracranial abnormality, multiple remote lacunar infarcts involving the bilateral basal ganglia, with chronic left cerebellar infarcts.   MRI head - Moderate-sized acute ischemic nonhemorrhagic left ACA territory infarct  CTA H&N - severe distal Left ICA siphon stenosis due to bulky calcified plaque, multifocal Left MCA irregularity and stenoses, up to severe stenoses of both distal VAs, and the Left PCA. bilateral MCA M3, and Left ACA A2 severe stenoses.   EEG - cortical dysfunction in left frontotemporal region, no seizures.  2D Echo - EF 70-75%. No intracardiac source of embolism   LE venous doppler  Negative for DVT   Consider 30 day cardiac event monitoring as outpt  [employer] Virus 2 - negative  LDL - 113  HgbA1c - 5.3  UDS - negative  VTE prophylaxis - lovenox  No antithrombotic prior to admission, now on ASA 325 and plavix DAPT for 3 months and then ASA alone given significant intracranial stenosis  Ongoing aggressive stroke risk factor management  Therapy recommendations: CIR->SNF   Disposition:  Pending - pt without CIR or SNF coverage   Medically ready for d/c once d/c plan determined.   Palliative consult 04/23/19 - Full Code - plan OP referral to Hospice  Social Worker evaluating discharge options - goal would be some type of inpatient rehab facility. SW working with [facility] and durable POA to determine a course  Hypertension  Home BP meds: Zestril 2.5  On lisinopril to 10 -> 20  BP stable on the high end 150-160 . Long-term BP goal normotensive  Hyperlipidemia  Home Lipid lowering medication: none  LDL 113, goal < 70  Current lipid lowering medication: on lipitor 40  Continue statin at discharge  Dysphagia, resolved . Secondary to stroke . Now D1  thin liquids . Speech on board  Other Stroke Risk Factors  Advanced age  Hx stroke/TIA - by imaging  Other Active Problems  CKD - stage 3a, Cre 1.34->1.24->1.34  Dementia - on galantamine and Risperdal   Fall - right knee bruise  Hospital day # 11  Marvel Plan, MD PhD Stroke Neurology 04/27/2019 11:10 AM   To contact Stroke Continuity provider, please refer to [email or web address]. After hours, contact General Neurology

## 2019-04-27 NOTE — Progress Notes (Signed)
  Speech Language Pathology Treatment: Dysphagia;Cognitive-Linquistic  Patient Details Name: Scott Gilbert MRN: [id number] DOB: 19-Sep-1929 Today's Date: 04/27/2019 Time: 1191-4782 SLP Time Calculation (min) (ACUTE ONLY): 22 min  Assessment / Plan / Recommendation Clinical Impression  Patient received sitting upright in chair. Today's session with a focus on dysphagia and aphasia. Patient presents with mild oral dysphagia and severe global aphasia s/p CVA. Patient with joint attention intact, able to follow eye gaze when clinician pointed. Pt with attempted verbalization, an approximation of "[place]" when prompted to state his name. In word repetition task, patient repeating 0/6 items, though oral groping observed. In basic yes/no task, ST unable to obtain accurate yes/no response, despite multi-modal attempt. Pt did not nod head or shake head despite max cues. When provided visual aid, patient did not demonstrate ability to point despite max cues. In simple verbal comprehension task, ST provided two items in patient's immediate visual field and prompted him to point to or reach towards named item. Patient reaching towards correct named item in 4/7 opportunities given max visual/verbal cues.  Suspect element of oral apraxia present, but difficult to assess 2/2 severe aphasia.   Patient seen with trials of ice chips, thin liquids, puree solids and diced peaches. Pt with adequate oral acceptance, mildly prolonged oral transit of solid consistencies. Swallow initiation appeared timely and adequate. No observable residue or overt s/sx aspiration seen following the swallow with any consistencies.  D/w RN: RN reports patient tolerated dysphagia 1/thin liquids very well this date.   Recommend upgrade to dysphagia 2 solids/thin liquids: full supervision, remove dentures when eating, ensure patient alert/upright, small bites and sips. ST to follow as per POC. Patient will benefit from continued ST services at  the next level of care: CIR vs SNF.   HPI HPI: Dragon Thrush is a 86 y.o. male with past medical history significant for hypertension, mild dementia presented to the emergency department with sudden onset of slurred speech and aphasia, facial droop and weakness.   CT cerebral perfusion on 04/16/19 reported: "acute distal Left ACA territory ischemia and infarct."       SLP Plan  Continue with current plan of care       Recommendations  Diet recommendations: Dysphagia 2 (fine chop);Thin liquid Liquids provided via: Cup;No straw Medication Administration: Crushed with puree Supervision: Full supervision/cueing for compensatory strategies Compensations: Minimize environmental distractions;Slow rate;Small sips/bites Postural Changes and/or Swallow Maneuvers: Seated upright 90 degrees                Oral Care Recommendations: Oral care BID;Staff/trained caregiver to provide oral care Follow up Recommendations: 24 hour supervision/assistance;Skilled Nursing facility SLP Visit Diagnosis: Dysphagia, oral phase (R13.11) Plan: Continue with current plan of care       GO              Shella Spearing, M.Ed., CCC-SLP Speech Therapy Acute Rehabilitation [phone]: Acute Rehab office [phone] - pager    Shella Spearing 04/27/2019, 2:23 PM

## 2019-04-28 ENCOUNTER — Inpatient Hospital Stay (HOSPITAL_COMMUNITY): Payer: No Typology Code available for payment source

## 2019-04-28 DIAGNOSIS — D72829 Elevated white blood cell count, unspecified: Secondary | ICD-10-CM

## 2019-04-28 LAB — URINALYSIS, COMPLETE (UACMP) WITH MICROSCOPIC
Bacteria, UA: NONE SEEN
Bilirubin Urine: NEGATIVE
Glucose, UA: NEGATIVE mg/dL
Hgb urine dipstick: NEGATIVE
Ketones, ur: NEGATIVE mg/dL
Leukocytes,Ua: NEGATIVE
Nitrite: NEGATIVE
Protein, ur: NEGATIVE mg/dL
Specific Gravity, Urine: 1.017 (ref 1.005–1.030)
pH: 7 (ref 5.0–8.0)

## 2019-04-28 LAB — BASIC METABOLIC PANEL
Anion gap: 9 (ref 5–15)
BUN: 28 mg/dL — ABNORMAL HIGH (ref 8–23)
CO2: 24 mmol/L (ref 22–32)
Calcium: 8.6 mg/dL — ABNORMAL LOW (ref 8.9–10.3)
Chloride: 102 mmol/L (ref 98–111)
Creatinine, Ser: 1.19 mg/dL (ref 0.61–1.24)
GFR calc Af Amer: >60 mL/min (ref >60)
GFR calc non Af Amer: 55 mL/min — ABNORMAL LOW (ref >60)
Glucose, Bld: 119 mg/dL — ABNORMAL HIGH (ref 70–99)
Potassium: 4.3 mmol/L (ref 3.5–5.1)
Sodium: 135 mmol/L (ref 135–145)

## 2019-04-28 LAB — CBC
HCT: 35.5 % — ABNORMAL LOW (ref 39.0–52.0)
Hemoglobin: 12.6 g/dL — ABNORMAL LOW (ref 13.0–17.0)
MCH: 31.9 pg (ref 26.0–34.0)
MCHC: 35.5 g/dL (ref 30.0–36.0)
MCV: 89.9 fL (ref 80.0–100.0)
Platelets: 297 10*3/uL (ref 150–400)
RBC: 3.95 MIL/uL — ABNORMAL LOW (ref 4.22–5.81)
RDW: 11.8 % (ref 11.5–15.5)
WBC: 12 10*3/uL — ABNORMAL HIGH (ref 4.0–10.5)
nRBC: 0 % (ref 0.0–0.2)

## 2019-04-28 NOTE — Progress Notes (Signed)
STROKE TEAM PROGRESS NOTE   INTERVAL HISTORY Patient lying in bed, awake, alert, making eye contact.  Able to track, follow to send recommend today, but not peripheral commands.  WBC 12.0, will check UA and CXR.  OBJECTIVE Vitals:   04/27/19 2333 04/28/19 0322 04/28/19 0329 04/28/19 0820  BP: (!) 157/73 (!) 156/80  (!) 158/67  Pulse: 81 (!) 113 88 90  Resp: 16 16  16   Temp: 98.9 F (37.2 C) 98.7 F (37.1 C)  97.7 F (36.5 C)  TempSrc: Oral   Oral  SpO2: 96% 97%  95%  Weight:       CBC:  Recent Labs  Lab 04/26/19 0557 04/28/19 0545  WBC 6.4 12.0*  HGB 11.5* 12.6*  HCT 33.3* 35.5*  MCV 92.0 89.9  PLT 266 762   Basic Metabolic Panel:  Recent Labs  Lab 04/26/19 0557 04/28/19 0545  NA 133* 135  K 4.2 4.3  CL 103 102  CO2 22 24  GLUCOSE 122* 119*  BUN 31* 28*  CREATININE 1.34* 1.19  CALCIUM 8.2* 8.6*    IMAGING past 24h No results found.   PHYSICAL EXAM  General - Well nourished, well developed elderly caucasian male, in no apparent distress.    Ophthalmologic - fundi not visualized due to noncooperation.  Cardiovascular - Regular rhythm and rate.  Neuro - awake alert, making eye contact, tracking bilaterally. Global aphasia, did follow eye opening and close and mouth open and close but not any peripheral commands. Able to laugh but no speech output.  No gaze preference, blinking to visual threat bilaterally. Right facial droop.  Tongue midline hemolysis.  Left upper extremity 4/5, left lower extremity at least 3-/5.  Right upper extremity 0/5 with increased muscle tone, and right lower extremity mild withdrawal on pain stimulation 2+/5.  No Babinski. Sensation, coordination and gait not tested.   ASSESSMENT/PLAN [name] is a 86 y.o. male with history of hypertension and  mild dementia (medical care at [facility]), presents to the emergency department with sudden onset of slurred speech, aphasia, left gaze preference with right-sided neglect, right  hemiparesis, facial droop and weakness. He received IV t-PA Friday 04/16/19 at 2230.  Stroke: Left ACA territory infarct, likely due to large vessel disease although cardioembolic source not excluded  Code Stroke CT Head - No acute intracranial abnormality, multiple remote lacunar infarcts involving the bilateral basal ganglia, with chronic left cerebellar infarcts.   MRI head - Moderate-sized acute ischemic nonhemorrhagic left ACA territory infarct  CTA H&N - severe distal Left ICA siphon stenosis due to bulky calcified plaque, multifocal Left MCA irregularity and stenoses, up to severe stenoses of both distal VAs, and the Left PCA. bilateral MCA M3, and Left ACA A2 severe stenoses.   EEG - cortical dysfunction in left frontotemporal region, no seizures.  2D Echo - EF 70-75%. No intracardiac source of embolism   LE venous doppler  Negative for DVT   Consider 30 day cardiac event monitoring as outpt  [employer] Virus 2 - negative  LDL - 113  HgbA1c - 5.3  UDS - negative  VTE prophylaxis - lovenox  No antithrombotic prior to admission, now on ASA 325 and plavix DAPT for 3 months and then ASA alone given significant intracranial stenosis  Ongoing aggressive stroke risk factor management  Palliative consult 04/23/19 - Full Code - plan OP referral to Hospice  Therapy recommendations: CIR->SNF   Disposition:  Pending - pt without insurance coverage   Medically ready  for d/c once d/c plan determined.   Hypertension  Home BP meds: Zestril 2.5  On lisinopril to 10 -> 20  BP stable on the high end 150-160 . Long-term BP goal normotensive  Hyperlipidemia  Home Lipid lowering medication: none  LDL 113, goal < 70  Current lipid lowering medication: on lipitor 40  Continue statin at discharge  Dysphagia, resolved . Secondary to stroke . D1 thin liquids->D2 thin liquids . Speech on board  Leukocytosis  WBC 6.4-12.0  UA pending  CXR pending  Afebrile  Other  Stroke Risk Factors  Advanced age  Hx stroke/TIA - by imaging  Other Active Problems  CKD - stage 3a, Cre 1.34->1.24->1.34->1.19  Dementia - on galantamine and Risperdal   Fall - right knee bruise  Hospital day # 12  Marvel Plan, MD PhD Stroke Neurology 04/28/2019 10:49 AM   To contact Stroke Continuity provider, please refer to [email or web address]. After hours, contact General Neurology

## 2019-04-28 NOTE — Progress Notes (Signed)
  Speech Language Pathology Treatment: Dysphagia;Cognitive-Linquistic  Patient Details Name: Scott Gilbert MRN: [id number] DOB: [birth year] Today's Date: 04/28/2019 Time: 3220-2542 SLP Time Calculation (min) (ACUTE ONLY): 25 min  Assessment / Plan / Recommendation Clinical Impression  Pt seen for dysphagia and aphasia. Needed help grasping spoon but used spoon appropriately to feed himself bites applesauce. He is endentulous but masticated small bite cracker appropriately. Coughed with thin liquid only after solid presentation. Trialed pt with straw sips water which appeared safe from subjective judgement. Would recommend he continue Dys 2 being without dentition.  He followed simple commands 30% of the time. SLP utilized multiple methods to elicit accurate/meaningful verbalizations including melodic intonation during familiar melody, written communication paired with verbal, imitation of sounds/words (biographical). He appeared to respond "uh huh" once and all other responses were mild laughs. He became labile during familiar song. Pt will continue to need intense ST to maximize communication ability    HPI HPI: Scott Gilbert is a 86 y.o. male with past medical history significant for hypertension, mild dementia presented to the emergency department with sudden onset of slurred speech and aphasia, facial droop and weakness.   CT cerebral perfusion on 04/16/19 reported: "acute distal Left ACA territory ischemia and infarct."       SLP Plan  Continue with current plan of care       Recommendations  Liquids provided via: Cup;Straw Medication Administration: Crushed with puree Supervision: Full supervision/cueing for compensatory strategies Compensations: Minimize environmental distractions;Slow rate;Small sips/bites Postural Changes and/or Swallow Maneuvers: Seated upright 90 degrees                Oral Care Recommendations: Oral care BID Follow up Recommendations: 24 hour  supervision/assistance;Skilled Nursing facility SLP Visit Diagnosis: Dysphagia, oral phase (R13.11);Aphasia (R47.01) Plan: Continue with current plan of care       GO                Royce Macadamia 04/28/2019, 12:30 PM  Breck Coons Lonell Face.Ed Nurse, children's [phone] Office [phone]

## 2019-04-29 MED ORDER — AMLODIPINE BESYLATE 5 MG PO TABS
5.0000 mg | ORAL_TABLET | Freq: Every day | ORAL | Status: DC
  Administered 2019-04-29 – 2019-05-03 (×5): 5 mg via ORAL
  Filled 2019-04-29 (×5): qty 1

## 2019-04-29 NOTE — Progress Notes (Signed)
Physical Therapy Treatment Patient Details Name: Scott Gilbert MRN: [id number] DOB: September 06, 1929 Today's Date: 04/29/2019    History of Present Illness 86 y.o. male with past medical history significant for hypertension and mild dementia. He presented to the emergency department with sudden onset of slurred speech and aphasia, facial droop and weakness.   CT cerebral perfusion on 04/16/19 reported: "acute distal Left ACA territory ischemia and infarct."    PT Comments    Pt following simple commands 50-75% of the time today. Session focused on static/dynamic sitting balance, transfer and pre gait training. Able to grip onto a walker with right hand due to increased tone and take steps with up to two person maximal assist. Will continue to progress as tolerated.    Follow Up Recommendations  CIR;Supervision/Assistance - 24 hour     Equipment Recommendations  Wheelchair (measurements PT)    Recommendations for Other Services       Precautions / Restrictions Precautions Precautions: Fall Restrictions Weight Bearing Restrictions: No    Mobility  Bed Mobility Overal bed mobility: Needs Assistance Bed Mobility: Rolling;Sidelying to Sit Rolling: Min assist   Supine to sit: Mod assist     General bed mobility comments: MinA to roll towards right, modA for trunk elevation to upright  Transfers Overall transfer level: Needs assistance Equipment used: Rolling walker (2 wheeled) Transfers: Sit to/from Stand Sit to Stand: Mod assist;+2 physical assistance         General transfer comment: Right hand placed on walker, modA to stand from edge of bed and recliner  Ambulation/Gait Ambulation/Gait assistance: Max assist;+2 physical assistance Gait Distance (Feet): 3 Feet Assistive device: Rolling walker (2 wheeled) Gait Pattern/deviations: Step-to pattern;Decreased stance time - right;Decreased dorsiflexion - right;Narrow base of support Gait velocity: decreased Gait velocity  interpretation: <1.31 ft/sec, indicative of household ambulator General Gait Details: Pt initially with hyperextended RLE, able to take steps forward with manual assist of RLE. R knee buckle towards end requiring maxA to return to upright and pivot into recliner   Stairs             Wheelchair Mobility    Modified Rankin (Stroke Patients Only) Modified Rankin (Stroke Patients Only) Pre-Morbid Rankin Score: No symptoms Modified Rankin: Moderately severe disability     Balance Overall balance assessment: Needs assistance Sitting-balance support: Single extremity supported;Feet supported Sitting balance-Leahy Scale: Fair Sitting balance - Comments: Able to achieve midline positioning with visual cue, accepts minimal challenge     Standing balance-Leahy Scale: Poor Standing balance comment: reliant on external support and mod A                            Cognition Arousal/Alertness: Awake/alert Behavior During Therapy: WFL for tasks assessed/performed Overall Cognitive Status: Difficult to assess                                 General Comments: Pt not verbalizing, laughing occasionally. Following 1 step commands 50-75% of the time.      Exercises Other Exercises Other Exercises: Sitting: functional reaching in all planes with LUE Other Exercises: Supine: PROM R hip/knee flexion, shoulder flexion    General Comments        Pertinent Vitals/Pain Pain Assessment: Faces Faces Pain Scale: No hurt    Home Living  Prior Function            PT Goals (current goals can now be found in the care plan section) Acute Rehab PT Goals Potential to Achieve Goals: Fair Progress towards PT goals: Progressing toward goals    Frequency    Min 3X/week      PT Plan Current plan remains appropriate    Co-evaluation              AM-PAC PT "6 Clicks" Mobility   Outcome Measure  Help needed turning from your  back to your side while in a flat bed without using bedrails?: A Little Help needed moving from lying on your back to sitting on the side of a flat bed without using bedrails?: A Lot Help needed moving to and from a bed to a chair (including a wheelchair)?: Total Help needed standing up from a chair using your arms (e.g., wheelchair or bedside chair)?: A Lot Help needed to walk in hospital room?: Total Help needed climbing 3-5 steps with a railing? : Total 6 Click Score: 10    End of Session Equipment Utilized During Treatment: Gait belt Activity Tolerance: Patient tolerated treatment well Patient left: in chair;with call bell/phone within reach;with chair alarm set Nurse Communication: Mobility status PT Visit Diagnosis: Other abnormalities of gait and mobility (R26.89);Hemiplegia and hemiparesis Hemiplegia - Right/Left: Right     Time: 3299-2426 PT Time Calculation (min) (ACUTE ONLY): 24 min  Charges:  $Therapeutic Exercise: 8-22 mins $Therapeutic Activity: 8-22 mins                       Wyona Almas, PT, DPT Acute Rehabilitation Services Pager [phone] Office [phone]    Scott Gilbert 04/29/2019, 5:29 PM

## 2019-04-29 NOTE — Progress Notes (Signed)
STROKE TEAM PROGRESS NOTE   INTERVAL HISTORY Pt lying in bed, awake alert and tracking, smile and laugh with me but non verbal and still not following commands except close and open eyes. Neuro stable. UA and CXR neg.   OBJECTIVE Vitals:   04/28/19 2318 04/29/19 0343 04/29/19 0845 04/29/19 1240  BP: (!) 153/57 (!) 157/78 (!) 127/59 (!) 158/65  Pulse: 81 93 93   Resp: 19 19 18 18   Temp: 98.5 F (36.9 C) 98 F (36.7 C) 98.1 F (36.7 C) 98.6 F (37 C)  TempSrc: Oral Oral Oral Oral  SpO2: 95% 99% 95% 96%  Weight:       CBC:  Recent Labs  Lab 04/26/19 0557 04/28/19 0545  WBC 6.4 12.0*  HGB 11.5* 12.6*  HCT 33.3* 35.5*  MCV 92.0 89.9  PLT 266 270   Basic Metabolic Panel:  Recent Labs  Lab 04/26/19 0557 04/28/19 0545  NA 133* 135  K 4.2 4.3  CL 103 102  CO2 22 24  GLUCOSE 122* 119*  BUN 31* 28*  CREATININE 1.34* 1.19  CALCIUM 8.2* 8.6*    IMAGING past 24h DG CHEST PORT 1 VIEW  Result Date: 04/28/2019 CLINICAL DATA:  Leukocytosis. EXAM: PORTABLE CHEST 1 VIEW COMPARISON:  None. FINDINGS: The cardiomediastinal contours are normal. Upper normal heart size with aortic atherosclerosis. Multiple calcified pulmonary nodules consistent with prior granulomatous disease. Suspected calcified mediastinal and right hilar nodes. Pulmonary vasculature is normal. No consolidation, pleural effusion, or pneumothorax. No acute osseous abnormalities are seen. IMPRESSION: 1. No acute chest findings.  No evidence of pneumonia. 2. Sequela of prior granulomatous disease. Aortic Atherosclerosis (ICD10-I70.0). Electronically Signed   By: Keith Rake M.D.   On: 04/28/2019 18:00     PHYSICAL EXAM   General - Well nourished, well developed elderly caucasian male, in no apparent distress.    Ophthalmologic - fundi not visualized due to noncooperation.  Cardiovascular - Regular rhythm and rate.  Neuro - awake alert, making eye contact, tracking bilaterally. Global aphasia, did follow eye  opening and close but not any peripheral commands. Able to laugh but no speech output.  No gaze preference, blinking to visual threat bilaterally. Right facial droop.  Tongue midline hemolysis.  Left upper extremity 4/5, left lower extremity at least 3-/5.  Right upper extremity 0/5 with increased muscle tone, and right lower extremity mild withdrawal on pain stimulation 2+/5.  No Babinski. Sensation, coordination and gait not tested.   ASSESSMENT/PLAN Scott Gilbert is a 86 y.o. male with history of hypertension and  mild dementia (medical care at [facility]), presents to the emergency department with sudden onset of slurred speech, aphasia, left gaze preference with right-sided neglect, right hemiparesis, facial droop and weakness. He received IV t-PA Friday 04/16/19 at 2230.  Stroke: Left ACA territory infarct, likely due to large vessel disease although cardioembolic source not excluded  Code Stroke CT Head - No acute intracranial abnormality, multiple remote lacunar infarcts involving the bilateral basal ganglia, with chronic left cerebellar infarcts.   MRI head - Moderate-sized acute ischemic nonhemorrhagic left ACA territory infarct  CTA H&N - severe distal Left ICA siphon stenosis due to bulky calcified plaque, multifocal Left MCA irregularity and stenoses, up to severe stenoses of both distal VAs, and the Left PCA. bilateral MCA M3, and Left ACA A2 severe stenoses.   EEG - cortical dysfunction in left frontotemporal region, no seizures.  2D Echo - EF 70-75%. No intracardiac source of embolism   LE venous  doppler  Negative for DVT   Consider 30 day cardiac event monitoring as outpt  [employer] Virus 2 - negative  LDL - 113  HgbA1c - 5.3  UDS - negative  VTE prophylaxis - lovenox  No antithrombotic prior to admission, now on ASA 325 and plavix DAPT for 3 months and then ASA alone given significant intracranial stenosis  Ongoing aggressive stroke risk factor  management  Palliative consult 04/23/19 - Full Code - plan OP referral to Hospice  Therapy recommendations: CIR->SNF   Disposition:  Pending - pt without insurance coverage   Medically ready for d/c once d/c plan determined.   Hypertension  Home BP meds: Zestril 2.5  On lisinopril to 10 -> 20  Add norvasc 5mg   BP stable on the high end 150-160 . Long-term BP goal normotensive  Hyperlipidemia  Home Lipid lowering medication: none  LDL 113, goal < 70  Current lipid lowering medication: on lipitor 40  Continue statin at discharge  Dysphagia, resolved . Secondary to stroke . D1 thin liquids->D2 thin liquids . Speech on board  Leukocytosis  WBC 6.4-12.0   UA negative   CXR NAD   Afebrile  Continue to monitor  Other Stroke Risk Factors  Advanced age  Hx stroke/TIA - by imaging  Other Active Problems  CKD - stage 3a, Cre 1.34->1.24->1.34->1.19   Dementia - on galantamine and Risperdal   Fall - right knee bruise  Hospital day # 13  04-13-1968, MD PhD Stroke Neurology 04/29/2019 1:56 PM   To contact Stroke Continuity provider, please refer to 06/27/2019. After hours, contact General Neurology

## 2019-04-29 NOTE — Progress Notes (Signed)
  Speech Language Pathology Treatment: Dysphagia;Cognitive-Linquistic  Patient Details Name: Scott Gilbert MRN: [id number] DOB: [birth year] Today's Date: 04/29/2019 Time: 1001-1019 SLP Time Calculation (min) (ACUTE ONLY): 18 min  Assessment / Plan / Recommendation Clinical Impression  Pt seen for skilled ST targeting oral dysphagia and aphasia/apraxia. Pt with verbalization upon ST entry. Patient verbalized an approximation of "[place]" x2 after being asked to state his name. ST trialled use of easy onset, MIT, HOH assisted pacing in order to target speech. Patient demonstrated oral groping, stated "heh" in task designed to elicit "hello." Receptively, pt demonstrates ability to visually scan and reach towards named objects, though inconsistently. Pt able to follow one step verbal motor commands (open mouth, close eyes) but then began to perseverate on opening and closing eyes. ST educated RN re: patient's global aphasia.  Pt seen with puree bolus of applesauce, yogurt with blueberries and thin liquids. Pt with prolonged oral phase with blueberries, but eventually able to initiate the swallow with minimal diffuse oral residue. No overt s/sx aspiration.  Recommend continuation of dysphagia 2 solids secondary to prolonged oral phase and missing dentition. ST to follow as per POC.   HPI HPI: Scott Gilbert is a 86 y.o. male with past medical history significant for hypertension, mild dementia presented to the emergency department with sudden onset of slurred speech and aphasia, facial droop and weakness.   CT cerebral perfusion on 04/16/19 reported: "acute distal Left ACA territory ischemia and infarct."       SLP Plan  Continue with current plan of care       Recommendations  Diet recommendations: Dysphagia 2 (fine chop);Thin liquid Liquids provided via: Cup;Straw Medication Administration: Crushed with puree Supervision: Full supervision/cueing for compensatory strategies Compensations:  Minimize environmental distractions;Slow rate;Small sips/bites Postural Changes and/or Swallow Maneuvers: Seated upright 90 degrees                Oral Care Recommendations: Oral care BID Follow up Recommendations: 24 hour supervision/assistance;Skilled Nursing facility SLP Visit Diagnosis: Apraxia (R48.2);Aphasia (R47.01);Dysphagia, oral phase (R13.11) Plan: Continue with current plan of care       GO                Charnele Semple 04/29/2019, 11:07 AM Shella Spearing, M.Ed., CCC-SLP Speech Therapy Acute Rehabilitation [phone]: Acute Rehab office [phone] - pager

## 2019-04-30 ENCOUNTER — Inpatient Hospital Stay (HOSPITAL_COMMUNITY): Payer: No Typology Code available for payment source

## 2019-04-30 DIAGNOSIS — R0682 Tachypnea, not elsewhere classified: Secondary | ICD-10-CM

## 2019-04-30 DIAGNOSIS — R000 Tachycardia, unspecified: Secondary | ICD-10-CM

## 2019-04-30 LAB — CBC
HCT: 38.8 % — ABNORMAL LOW (ref 39.0–52.0)
Hemoglobin: 13.4 g/dL (ref 13.0–17.0)
MCH: 31.4 pg (ref 26.0–34.0)
MCHC: 34.5 g/dL (ref 30.0–36.0)
MCV: 90.9 fL (ref 80.0–100.0)
Platelets: 344 10*3/uL (ref 150–400)
RBC: 4.27 MIL/uL (ref 4.22–5.81)
RDW: 11.6 % (ref 11.5–15.5)
WBC: 8.9 10*3/uL (ref 4.0–10.5)
nRBC: 0 % (ref 0.0–0.2)

## 2019-04-30 LAB — URINALYSIS, COMPLETE (UACMP) WITH MICROSCOPIC
Bacteria, UA: NONE SEEN
Bilirubin Urine: NEGATIVE
Glucose, UA: NEGATIVE mg/dL
Ketones, ur: NEGATIVE mg/dL
Leukocytes,Ua: NEGATIVE
Nitrite: NEGATIVE
Protein, ur: NEGATIVE mg/dL
Specific Gravity, Urine: 1.011 (ref 1.005–1.030)
pH: 6 (ref 5.0–8.0)

## 2019-04-30 LAB — BASIC METABOLIC PANEL
Anion gap: 10 (ref 5–15)
BUN: 30 mg/dL — ABNORMAL HIGH (ref 8–23)
CO2: 22 mmol/L (ref 22–32)
Calcium: 8.7 mg/dL — ABNORMAL LOW (ref 8.9–10.3)
Chloride: 100 mmol/L (ref 98–111)
Creatinine, Ser: 1.29 mg/dL — ABNORMAL HIGH (ref 0.61–1.24)
GFR calc Af Amer: 57 mL/min — ABNORMAL LOW (ref >60)
GFR calc non Af Amer: 50 mL/min — ABNORMAL LOW (ref >60)
Glucose, Bld: 127 mg/dL — ABNORMAL HIGH (ref 70–99)
Potassium: 4 mmol/L (ref 3.5–5.1)
Sodium: 132 mmol/L — ABNORMAL LOW (ref 135–145)

## 2019-04-30 MED ORDER — ENSURE ENLIVE PO LIQD
237.0000 mL | Freq: Two times a day (BID) | ORAL | Status: DC
  Administered 2019-05-01 – 2019-07-02 (×112): 237 mL via ORAL
  Filled 2019-04-30 (×5): qty 237

## 2019-04-30 MED ORDER — SODIUM CHLORIDE 0.9 % IV SOLN: INTRAVENOUS | Status: DC

## 2019-04-30 MED ORDER — SODIUM CHLORIDE 0.9 % IV BOLUS
250.0000 mL | Freq: Once | INTRAVENOUS | Status: AC
  Administered 2019-04-30: 18:00:00 250 mL via INTRAVENOUS

## 2019-04-30 NOTE — TOC Progression Note (Signed)
LATE NOTE SUBMISSION   Transition of Care [facility]) - Progression Note    Patient Details  Name: Zacory Fiola MRN: [id number] Date of Birth: Aug 15, 1929  [facility]) CM/SW Contact  Baldemar Lenis, [name] Phone Number: 04/30/2019, 3:54 PM  Clinical Narrative:   CSW spoke with Arva Chafe, patient's fiduciary, that the hospital financial counseling office will be unable to assist as patient appears to have other coverage and will not qualify for Medicaid without first applying for Medicare. Doug aware that he and the family will have to pursue Medicare/Medicaid for the patient to have a payor source, and he has a friend who works in [place] who will be able to assist him with what he needs to do. CSW also asked about patient's income as he may need to private pay in the short-term until he is approved for Medicare/Medicaid, and Doug privated information about patient's income. Gala Romney will work towards applying for Medicare/Medicaid for the patient, and will update CSW with progress.   CSW sent out referral for patient to see if SNF could take the patient with his monthly income amount, awaiting bed offers at this time. CSW discussed with CSW Supervisor about barriers to ensure that was the appropriate route, and supervisor in agreement. No SNF offers at this time as patient has no payor source.    Expected Discharge Plan: IP Rehab Facility Barriers to Discharge: Continued Medical Work up, Inadequate or no insurance  Expected Discharge Plan and Services Expected Discharge Plan: IP Rehab Facility       Living arrangements for the past 2 months: Single Family Home                                       Social Determinants of Health (SDOH) Interventions    Readmission Risk Interventions No flowsheet data found.

## 2019-04-30 NOTE — Progress Notes (Addendum)
STROKE TEAM PROGRESS NOTE   INTERVAL HISTORY Patient lying in bed, comfortably.  No neuro changes, no acute event overnight.  Smiling in the left to provider.  Still global aphasia and not following commands.  However, in the p.m., patient had mild tachycardia and mild tachypnea.  Feels warm, however temp 99.2.  As per RN, patient did not eat well today, will give IV bolus, IV fluid, repeat UA and chest x-ray.  OBJECTIVE Vitals:   04/29/19 2331 04/30/19 0343 04/30/19 0745 04/30/19 1112  BP: (!) 145/72 (!) 165/68 134/62 (!) 140/55  Pulse: 97 67 87 84  Resp: 18 18 19 17   Temp: 99 F (37.2 C) 99.1 F (37.3 C) 98 F (36.7 C) 98.9 F (37.2 C)  TempSrc: Oral Oral Oral Oral  SpO2: 96% 97% 98% 97%  Weight:       CBC:  Recent Labs  Lab 04/28/19 0545 04/30/19 0656  WBC 12.0* 8.9  HGB 12.6* 13.4  HCT 35.5* 38.8*  MCV 89.9 90.9  PLT 297 297   Basic Metabolic Panel:  Recent Labs  Lab 04/28/19 0545 04/30/19 0656  NA 135 132*  K 4.3 4.0  CL 102 100  CO2 24 22  GLUCOSE 119* 127*  BUN 28* 30*  CREATININE 1.19 1.29*  CALCIUM 8.6* 8.7*    IMAGING past 24h No results found.   PHYSICAL EXAM   General - Well nourished, well developed elderly caucasian male, in no apparent distress.    Ophthalmologic - fundi not visualized due to noncooperation.  Cardiovascular - Regular rhythm and rate.  Neuro - awake alert, making eye contact, tracking bilaterally. Global aphasia, did follow central or peripheral commands today. Able to laugh but no speech output.  No gaze preference, blinking to visual threat bilaterally. Right facial droop.  Tongue midline hemolysis.  Left upper extremity 4/5, left lower extremity at least 3-/5.  Right upper extremity 0/5 with significantly increased muscle tone, and right lower extremity mild withdrawal on pain stimulation 2+/5.  No Babinski. Sensation, coordination and gait not tested.   ASSESSMENT/PLAN [name] is a 86 y.o. male with history  of hypertension and  mild dementia (medical care at [facility]), presents to the emergency department with sudden onset of slurred speech, aphasia, left gaze preference with right-sided neglect, right hemiparesis, facial droop and weakness. He received IV t-PA Friday 04/16/19 at 2230.  Stroke: Left ACA territory infarct, likely due to large vessel disease although cardioembolic source not excluded  Code Stroke CT Head - No acute intracranial abnormality, multiple remote lacunar infarcts involving the bilateral basal ganglia, with chronic left cerebellar infarcts.   MRI head - Moderate-sized acute ischemic nonhemorrhagic left ACA territory infarct  CTA H&N - severe distal Left ICA siphon stenosis due to bulky calcified plaque, multifocal Left MCA irregularity and stenoses, up to severe stenoses of both distal VAs, and the Left PCA. bilateral MCA M3, and Left ACA A2 severe stenoses.   EEG - cortical dysfunction in left frontotemporal region, no seizures.  2D Echo - EF 70-75%. No intracardiac source of embolism   LE venous doppler  Negative for DVT   Consider 30 day cardiac event monitoring as outpt  [employer] Virus 2 - negative  LDL - 113  HgbA1c - 5.3  UDS - negative  VTE prophylaxis - lovenox  No antithrombotic prior to admission, now on ASA 325 and plavix DAPT for 3 months and then ASA alone given significant intracranial stenosis  Ongoing aggressive stroke risk factor  management  Palliative consult 04/23/19 - Full Code - plan OP referral to Hospice  Therapy recommendations: CIR->SNF   Disposition:  Pending - pt without insurance coverage   Medically ready for d/c once d/c plan determined.   Hypertension  Home BP meds: Zestril 2.5  On lisinopril to 10 -> 20  On norvasc 5mg   BP stable 130-140s . Long-term BP goal normotensive  Hyperlipidemia  Home Lipid lowering medication: none  LDL 113, goal < 70  Current lipid lowering medication: on lipitor 40  Continue statin at  discharge  Dysphagia, resolved . Secondary to stroke . D1 thin liquids->D2 thin liquids . Speech on board . Did not eat well today as per RN . Will give fluid bolus and IV fluid  Leukocytosis, resolved Mild tachycardia and tachypnea  T-max 99.2  WBC 6.4-12.0-8.9   UA negative 2/10, UA repeat pending  CXR NAD 2/10, repeat pending  Fluid bolus with IV fluid  Close monitoring  Other Stroke Risk Factors  Advanced age  Hx stroke/TIA - by imaging  Other Active Problems  CKD - stage 3a, Cre 1.34->1.24->1.34->1.19->1.29 -fluid bolus and IV fluid  Dementia - on galantamine and Risperdal   Fall - right knee bruise  Hospital day # 14  04-13-1970, MD PhD Stroke Neurology 04/30/2019 1:14 PM   To contact Stroke Continuity provider, please refer to 06/28/2019. After hours, contact General Neurology

## 2019-04-30 NOTE — Progress Notes (Signed)
I followed up with Mr Geisen. He was more alert today from previous visit. Despite any verbal communication he was able to participate in, I noticed his facial expressions. As I mentioned the Bible and story telling, I noticed him tearing up. I continued with words of encouragement, and ministry of presence. I will follow up with Mr Wiedemann as a later time.   Palliative care  Chaplain Resident Orest Dikes [phone]

## 2019-04-30 NOTE — Progress Notes (Addendum)
  Speech Language Pathology Treatment: Cognitive-Linquistic  Patient Details Name: Scott Gilbert MRN: [id number] DOB: 01/22/1930 Today's Date: 04/30/2019 Time: 6073-7106 SLP Time Calculation (min) (ACUTE ONLY): 20 min  Assessment / Plan / Recommendation Clinical Impression  Patient seen for skilled ST targeting global aphasia. Patient presents with improving expressive and receptive language skills. Patient attempting to initiate a phrase spontaneously during session x3, unfortunately it was unintelligible. 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 Patient verbalized "Gilbert" when prompted to state name consistently. When large print visual aid provided Noelle Penner") patient able to repeat this x3. Patient voice slightly dysphonic. With use of large print visual aids, patient led in automatic speech tasks: counting and alphabet. Patient attempted to count to five, visually scanning visual aids and making word approximations (primarily vowels). Patient visually scanned alphabet, but did not attempt verbalizations. Patient provided with written single words (large print)  he attempted to verbalize CA single words with max verbal/visual/tactile cues.  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  Patient benefits from max verbal cues paired with visual aid as well as extended time to respond.   ST attempted use of melodic intonation therapy and modified visual action therapy (VAT) to target aphasia. In VAT task, patient presented with object and asked to match name of object with object, patient demonstrated ability to point to both right and left, but appeared to perseveratively point to right side after 3 task items.  ST provided large yes/no visual aid, patient able to point to named "yes" or "no" with max cues, with approx. 70% consistency. However, ST unable to elicit a accurate or consistent yes/no response. In basic yes/no  biographical task, patient pointed to "no" for every question asked.   Patient able to follow one step simple verbal commands with 70% accuracy this session given max visual/verbal cues and extra time. ST to follow as per POC.    HPI HPI: Eliyah Bazzi is a 86 y.o. male with past medical history significant for hypertension, mild dementia presented to the emergency department with sudden onset of slurred speech and aphasia, facial droop and weakness.   CT cerebral perfusion on 04/16/19 reported: "acute distal Left ACA territory ischemia and infarct."       SLP Plan  Continue with current plan of care       Recommendations                   Plan: Continue with current plan of care       GO                Shella Spearing 04/30/2019, 11:37 AM  Shella Spearing, M.Ed., CCC-SLP Speech Therapy Acute Rehabilitation [phone]: Acute Rehab office [phone] - pager

## 2019-04-30 NOTE — TOC Progression Note (Signed)
Transition of Care [facility]) - Progression Note    Patient Details  Name: Scott Gilbert MRN: [id number] Date of Birth: 09/17/1929  [facility]) CM/SW Contact  Scott Gilbert, [name] Phone Number: 04/30/2019, 4:00 PM  Clinical Narrative:   CSW contacted Scott Gilbert, patient's fiduciary, to discuss any progress with applying for Medicare/Medicaid. Scott Gilbert has received permission from all of the patient's daughters to work on their behalf to pursue Medicare/Medicaid, and has been working to get paperwork that he needs from the [place] to document the patient's income and breakdown of the income, that social security will need to evaluate whether the patient would qualify. Scott Gilbert said he's also in discussion with the [facility] to ensure that whatever he does to apply for Medicare or Medicaid for the patient does not mess up his assistance with the [place], as the money through the [place] is the patient's main source of income. Scott Gilbert said he will be taking a day off in the near future once he has all of the documentation that he needs to go to complete the paperwork needed. Scott Gilbert will keep CSW informed if he needs anything to assist. CSW to complete a letter of incompetency for Scott Gilbert to take to apply for assistance on the patient's behalf, as he is unable to do so himself at this time. CSW to follow.    Expected Discharge Plan: IP Rehab Facility Barriers to Discharge: Continued Medical Work up, Inadequate or no insurance  Expected Discharge Plan and Services Expected Discharge Plan: IP Rehab Facility       Living arrangements for the past 2 months: Single Family Home                                       Social Determinants of Health (SDOH) Interventions    Readmission Risk Interventions No flowsheet data found.

## 2019-04-30 NOTE — TOC Progression Note (Signed)
Transition of Care [facility]) - Progression Note    Patient Details  Name: Scott Gilbert MRN: [id number] Date of Birth: Feb 10, 1930  [facility]) CM/SW Contact  Baldemar Lenis, [name] Phone Number: 04/30/2019, 3:57 PM  Clinical Narrative:   CSW received phone call from patient's daughter, Steward Drone, asking for update. CSW provided Steward Drone with update on working with Gala Romney as Veterinary surgeon to apply for Medicare/Medicaid for the patient, as he is the only one who has access to patient's income and financial information. Steward Drone asked if Gala Romney was cooperating and CSW assured her that Gala Romney was doing as much as he could to assist. CSW discussed other avenue of trying to see if any SNF would take the patient with his monthly income, but that so far no one had offered a bed for the patient with the amount of money that he gets a month. Steward Drone discussed how she is pursuing another avenue of her own of reaching out to her Pearlean Brownie who is supposed to support Veterans to see if there is anything that he can do to step in and "put his money where his mouth is". CSW to continue to follow. Patient has no bed offers at this time due to no payor source.    Expected Discharge Plan: IP Rehab Facility Barriers to Discharge: Continued Medical Work up, Inadequate or no insurance  Expected Discharge Plan and Services Expected Discharge Plan: IP Rehab Facility       Living arrangements for the past 2 months: Single Family Home                                       Social Determinants of Health (SDOH) Interventions    Readmission Risk Interventions No flowsheet data found.

## 2019-04-30 NOTE — Progress Notes (Signed)
Palliative Medicine Inpatient Follow Up Note   HPI: Per intake H&P --> Scott Gilbert a 86 y.o.malewith past medical history significant for hypertension, mild dementia presents to the emergency department with sudden onset of slurred speech and aphasia, facial droop and weakness.  According to the patient's daughter,patient was his normal self when around 9 PM family witnessed patient suddenly having slurred speech, however facial droop and weak. EMS was called their assessment patient having left gaze preference with right-sided neglect, right hemiparesis and aphasia. Blood pressure was 333 systolic. Arrived to [facility], ED as a code stroke.  On arrival,patient was taken medially to CT scan. Stat CT head was obtained which showed significant atrophy but no acute ischemic changes/hemorrhage. Patient was administered IV TPA after discussing risk versus benefit with daughter. CT angiogram was obtained which showed no large vessel occlusion however so severe intracranial atherosclerotic disease. As patient continued to have significant deficits despite IV TPA administration, CT perfusion was performed to assess if there was a thrombus that could have been missed on CTA. CT perfusion demonstrated core/penumbra in the ACA territory with possible occlusion of the A2 segment,not a candidate for thrombectomy due to distal site of occlusion  Past medical records are not available as patient seeks medical care at the [facility].  Today's Discussion (04/30/2019): Chart reviewed. Met with Scott Gilbert at bedside. He was able to track me around the room he was not vocal. His bedside RN, Scott Gilbert was present. She informed me that he appeared more tachypneic and tachycardic. She informed the primary Gilbert who initiated a work up. He appears to be eating rather poorly.   Reached out to patients daughter, Scott Gilbert and left a voicemail. 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  From a social perspective patient still does not appear to have medical coverage to go to a skilled facility.   Vital Signs Vitals:   04/30/19 1600 04/30/19 1610  BP: (!) 161/71 140/71  Pulse: (!) 111 (!) 108  Resp: (!) 22 (!) 22  Temp: 99.2 F (37.3 C) 98.2 F (36.8 C)  SpO2: 94% 94%    Intake/Output Summary (Last 24 hours) at 04/30/2019 1650 Last data filed at 04/30/2019 0343 Gross per 24 hour  Intake 200 ml  Output 225 ml  Net -25 ml   Last Weight  Most recent update: 04/17/2019  4:28 AM   Weight  62.3 kg (137 lb 5.6 oz)           Physical Exam Vitals and nursing note reviewed.  HENT:     Head: Normocephalic.     Nose: Nose normal.     Mouth/Throat:     Mouth: Mucous membranes are dry.  Eyes:     Pupils: Pupils are equal, round, and reactive to light.  Cardiovascular:     Rate and Rhythm: Tachycardia present.     Pulses: Normal pulses.  Pulmonary:     Effort: Pulmonary effort is normal.  Abdominal:     Palpations: Abdomen is soft.  Musculoskeletal:     Cervical back: Normal range of motion.  Skin:    General: Skin is warm.     Capillary Refill: Capillary refill takes less than 2 seconds.  Neurological:     Mental Status: He is alert.     Comments: Global apahasia  Psychiatric:        Speech: He is able to mumble slightly more today though  does not make sense  SUMMARY OF RECOMMENDATIONS   Full Code / Full scope  TOC - Outpatient referral to [facility] - complicated discharge d/t lack of insurance coverage  Symptom Management:  Muscular Weakness in the setting of Left ACA Infarct: - PT - OT Dysphagia:  Failure to Thrive:                 - Speech consult completed, passed evaluation                 - D2 Diet w. Thins  - 1:1 Feeding  - Supplementals Xerostomia: - Good oral care QShift - Encourage liquid intake Constipation:                  - Senna S BID Delirium Precuations:  - Get up during the day - Encourage a familiar face to remain present throughout the day - Keep blinds open and lights on during daylight hours - Minimize the use of opioids/benzodiazepines Spiritual: - Chaplain consult  Social:                 - Appreciate SW help. Patient has [facility] though is not service connected therefore cannot go to SNF.   Time Spent: 15 Greater than 50% of the time was spent in counseling and coordination of care ______________________________________________________________________________________ Scott [facility] Cell Phone: [phone] Please utilize secure chat with additional questions, if there is no response within 30 minutes please call the above phone number  Palliative Medicine [facility] providers are available by phone from 7am to 7pm daily and can be reached through the Gilbert cell phone.  Should this patient require assistance outside of these hours, please call the patient's attending physician.

## 2019-05-01 LAB — URINE CULTURE: Culture: NO GROWTH

## 2019-05-01 LAB — BASIC METABOLIC PANEL
Anion gap: 8 (ref 5–15)
BUN: 28 mg/dL — ABNORMAL HIGH (ref 8–23)
CO2: 24 mmol/L (ref 22–32)
Calcium: 8.5 mg/dL — ABNORMAL LOW (ref 8.9–10.3)
Chloride: 102 mmol/L (ref 98–111)
Creatinine, Ser: 1.28 mg/dL — ABNORMAL HIGH (ref 0.61–1.24)
GFR calc Af Amer: 58 mL/min — ABNORMAL LOW (ref >60)
GFR calc non Af Amer: 50 mL/min — ABNORMAL LOW (ref >60)
Glucose, Bld: 110 mg/dL — ABNORMAL HIGH (ref 70–99)
Potassium: 4.2 mmol/L (ref 3.5–5.1)
Sodium: 134 mmol/L — ABNORMAL LOW (ref 135–145)

## 2019-05-01 LAB — CBC
HCT: 35.3 % — ABNORMAL LOW (ref 39.0–52.0)
Hemoglobin: 12.4 g/dL — ABNORMAL LOW (ref 13.0–17.0)
MCH: 32 pg (ref 26.0–34.0)
MCHC: 35.1 g/dL (ref 30.0–36.0)
MCV: 91 fL (ref 80.0–100.0)
Platelets: 335 10*3/uL (ref 150–400)
RBC: 3.88 MIL/uL — ABNORMAL LOW (ref 4.22–5.81)
RDW: 11.9 % (ref 11.5–15.5)
WBC: 6.4 10*3/uL (ref 4.0–10.5)
nRBC: 0 % (ref 0.0–0.2)

## 2019-05-01 MED ORDER — CARBIDOPA-LEVODOPA 25-100 MG PO TABS
1.0000 | ORAL_TABLET | Freq: Three times a day (TID) | ORAL | Status: DC
  Administered 2019-05-01 – 2019-05-06 (×15): 1 via ORAL
  Filled 2019-05-01 (×15): qty 1

## 2019-05-01 NOTE — Progress Notes (Signed)
STROKE TEAM PROGRESS NOTE   INTERVAL HISTORY Patient lying in bed, comfortably.  No neuro changes, no acute event overnight.  Smiling in the left to provider.  Still global aphasia and attempts to following commands.  Notes to have R hand tremor and global bradykinesia.    OBJECTIVE Vitals:   04/30/19 1610 04/30/19 1959 04/30/19 2345 05/01/19 0412  BP: 140/71 (!) 149/66 (!) 130/51 (!) 142/71  Pulse: (!) 108 93 73 74  Resp: (!) 22 17 18 17   Temp: 98.2 F (36.8 C) 98.7 F (37.1 C) 98 F (36.7 C) 97.8 F (36.6 C)  TempSrc: Axillary Oral Oral Oral  SpO2: 94% 98% 96% 98%  Weight:       CBC:  Recent Labs  Lab 04/30/19 0656 05/01/19 0240  WBC 8.9 6.4  HGB 13.4 12.4*  HCT 38.8* 35.3*  MCV 90.9 91.0  PLT 344 382   Basic Metabolic Panel:  Recent Labs  Lab 04/30/19 0656 05/01/19 0240  NA 132* 134*  K 4.0 4.2  CL 100 102  CO2 22 24  GLUCOSE 127* 110*  BUN 30* 28*  CREATININE 1.29* 1.28*  CALCIUM 8.7* 8.5*    IMAGING past 24h DG CHEST PORT 1 VIEW  Result Date: 04/30/2019 CLINICAL DATA:  Hypertension, dementia, leukocytosis EXAM: PORTABLE CHEST 1 VIEW COMPARISON:  04/28/2019 FINDINGS: Single frontal view of the chest demonstrates a stable cardiac silhouette. Calcified mediastinal and hilar lymph nodes as well as multiple bilateral calcified granulomas consistent with sequela of previous granulomatous disease. No acute airspace disease, effusion, or pneumothorax. No acute bony abnormalities. IMPRESSION: 1. Stable exam, no acute process. Electronically Signed   By: Randa Ngo M.D.   On: 04/30/2019 17:49     PHYSICAL EXAM   General - Well nourished, well developed elderly caucasian male, in no apparent distress.    Ophthalmologic - fundi not visualized due to noncooperation.  Cardiovascular - Regular rhythm and rate.  Neuro - awake alert, making eye contact, tracking bilaterally. Global aphasia, did follow central or peripheral commands today. Able to laugh but no  speech output.  No gaze preference, blinking to visual threat bilaterally. Right facial droop.  Tongue midline.  Left upper extremity 4/5, left lower extremity at least 3-/5.  Right upper extremity 2/5 with significantly increased muscle tone, and right lower extremity mild withdrawal on pain stimulation 2+/5.  No Babinski. Sensation, coordination and gait not tested. R hand tremor noted with global bradykinesia and rigidity.    ASSESSMENT/PLAN [name] is a 86 y.o. male with history of hypertension and  mild dementia (medical care at [facility]), presents to the emergency department with sudden onset of slurred speech, aphasia, left gaze preference with right-sided neglect, right hemiparesis, facial droop and weakness. He received IV t-PA Friday 04/16/19 at 2230.  Stroke: Left ACA territory infarct, likely due to large vessel disease although cardioembolic source not excluded  Code Stroke CT Head - No acute intracranial abnormality, multiple remote lacunar infarcts involving the bilateral basal ganglia, with chronic left cerebellar infarcts.   MRI head - Moderate-sized acute ischemic nonhemorrhagic left ACA territory infarct  CTA H&N - severe distal Left ICA siphon stenosis due to bulky calcified plaque, multifocal Left MCA irregularity and stenoses, up to severe stenoses of both distal VAs, and the Left PCA. bilateral MCA M3, and Left ACA A2 severe stenoses.   EEG - cortical dysfunction in left frontotemporal region, no seizures.  2D Echo - EF 70-75%. No intracardiac source of embolism  LE venous doppler  Negative for DVT   Consider 30 day cardiac event monitoring as outpt  [employer] Virus 2 - negative  LDL - 113  HgbA1c - 5.3  UDS - negative  VTE prophylaxis - lovenox  No antithrombotic prior to admission, now on ASA 325 and plavix DAPT for 3 months and then ASA alone given significant intracranial stenosis  Ongoing aggressive stroke risk factor management  Palliative  consult 04/23/19 - Full Code - plan OP referral to Hospice  Therapy recommendations: CIR->SNF   Disposition:  Pending - pt without insurance coverage   Medically ready for d/c once d/c plan determined.   Hypertension  Home BP meds: Zestril 2.5  On lisinopril to 10 -> 20  On norvasc 5mg   BP stable 130-140s . Long-term BP goal normotensive  Hyperlipidemia  Home Lipid lowering medication: none  LDL 113, goal < 70  Current lipid lowering medication: on lipitor 40  Continue statin at discharge  Dysphagia, resolved . Secondary to stroke . D1 thin liquids->D2 thin liquids . Speech on board . Did not eat well today as per RN . 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   Hospital day # 15    To contact Stroke Continuity provider, please refer to 04-13-1970. After hours, contact General Neurology

## 2019-05-01 NOTE — Progress Notes (Signed)
Occupational Therapy Treatment Patient Details Name: Scott Gilbert MRN: [id number] DOB: 04-Jan-1930 Today's Date: 05/01/2019    History of present illness 86 y.o. male with past medical history significant for hypertension and mild dementia. He presented to the emergency department with sudden onset of slurred speech and aphasia, facial droop and weakness.   CT cerebral perfusion on 04/16/19 reported: "acute distal Left ACA territory ischemia and infarct."   OT comments  Pt continues to progress toward established OT goals. Pt soiled upon arrival, required minA to roll toward the right side and modA to roll toward the left with totalA for posterior pericare. Pt required hand over hand assistance to wash his face. He demonstrated decreased visual attention and decreased visual tracking, keeping his left eye shut majority of the time, but able to open it on command. He continues to demonstrate progress with following simple one step commands. Pt will continue to benefit from skilled OT services to maximize safety and independence with ADL/IADL and functional mobility. Will continue to follow acutely and progress as tolerated.    Follow Up Recommendations  CIR;Supervision/Assistance - 24 hour    Equipment Recommendations  Other (comment)(TBD)    Recommendations for Other Services      Precautions / Restrictions Precautions Precautions: Fall Restrictions Weight Bearing Restrictions: No       Mobility Bed Mobility Overal bed mobility: Needs Assistance Bed Mobility: Rolling Rolling: Mod assist         General bed mobility comments: minA to roll towards right, modA to roll towards left  Transfers Overall transfer level: Needs assistance               General transfer comment: deferred    Balance                                           ADL either performed or assessed with clinical judgement   ADL Overall ADL's : Needs assistance/impaired      Grooming: Wash/dry face;Maximal assistance Grooming Details (indicate cue type and reason): maxA with hand over hand assitance          Upper Body Dressing : Maximal assistance Upper Body Dressing Details (indicate cue type and reason): maxA to don gown     Toilet Transfer: Maximal assistance;+2 for physical assistance;+2 for safety/equipment Toilet Transfer Details (indicate cue type and reason): rolling R<>L for posterior pericare Toileting- Clothing Manipulation and Hygiene: Maximal assistance;+2 for physical assistance;+2 for safety/equipment Toileting - Clothing Manipulation Details (indicate cue type and reason): totalA for posterior pericare     Functional mobility during ADLs: Maximal assistance;+2 for physical assistance;+2 for safety/equipment General ADL Comments: pt assisting with rolling in bed to complete pericare;pt very modest and continuously attempting to cover self when being cleaned     Vision   Vision Assessment?: Vision impaired- to be further tested in functional context Additional Comments: pt demonstrated difficulty with visual attention, required frequent redirection to visual stimulus;decreased visual tracking noted this date   Perception     Praxis      Cognition Arousal/Alertness: Awake/alert Behavior During Therapy: WFL for tasks assessed/performed Overall Cognitive Status: Difficult to assess                                 General Comments: Pt inconsistently follows one step commands;verbalized 1x "yeah  okay" during session;receptive to visual cues        Exercises Exercises: Other exercises Other Exercises Other Exercises: PROM shoulder flexion, elbow extension, wrist flexion/extension, digit ROM   Shoulder Instructions       General Comments pt had BM upon arrival, NT and therapist completed posterior pericare and full linen change    Pertinent Vitals/ Pain       Pain Assessment: Faces Faces Pain Scale: Hurts a  little bit Pain Location: with RUE ROm Pain Descriptors / Indicators: Grimacing;Guarding Pain Intervention(s): Monitored during session;Limited activity within patient's tolerance  Home Living                                          Prior Functioning/Environment              Frequency  Min 2X/week        Progress Toward Goals  OT Goals(current goals can now be found in the care plan section)  Progress towards OT goals: Progressing toward goals  Acute Rehab OT Goals Patient Stated Goal: unable to state OT Goal Formulation: Patient unable to participate in goal setting Time For Goal Achievement: 05/15/19 Potential to Achieve Goals: Good ADL Goals Pt Will Perform Grooming: with min assist;sitting Pt Will Perform Upper Body Dressing: with min assist;sitting Pt Will Transfer to Toilet: with min assist;stand pivot transfer;bedside commode Pt Will Perform Toileting - Clothing Manipulation and hygiene: with min assist;sitting/lateral leans;sit to/from stand Pt/caregiver will Perform Home Exercise Program: Increased strength;Right Upper extremity;Increased ROM;With minimal assist Additional ADL Goal #1: Pt will participate in x3 mins of ADL task with moderate cueing to attend.  Plan Discharge plan remains appropriate    Co-evaluation                 AM-PAC OT "6 Clicks" Daily Activity     Outcome Measure   Help from another person eating meals?: A Lot Help from another person taking care of personal grooming?: A Little Help from another person toileting, which includes using toliet, bedpan, or urinal?: A Lot Help from another person bathing (including washing, rinsing, drying)?: A Lot Help from another person to put on and taking off regular upper body clothing?: A Little Help from another person to put on and taking off regular lower body clothing?: A Lot 6 Click Score: 14    End of Session    OT Visit Diagnosis: Unsteadiness on feet  (R26.81);Muscle weakness (generalized) (M62.81)   Activity Tolerance Patient tolerated treatment well   Patient Left in bed;with call bell/phone within reach;with bed alarm set   Nurse Communication Mobility status        Time: 1425-1505 OT Time Calculation (min): 40 min  Charges: OT General Charges $OT Visit: 1 Visit OT Treatments $Self Care/Home Management : 23-37 mins $Therapeutic Exercise: 8-22 mins  Diona Browner OTR/L Acute Rehabilitation Services Office: [phone]    Rebeca Alert 05/01/2019, 3:40 PM

## 2019-05-01 NOTE — Progress Notes (Addendum)
Palliative Medicine Inpatient Follow Up Note   HPI: Per intake H&P --> Scott Walraven Gibbsis a 86 y.o.malewith past medical history significant for hypertension, mild dementia presents to the emergency department with sudden onset of slurred speech and aphasia, facial droop and weakness.  According to the patient's daughter,patient was his normal self when around 9 PM family witnessed patient suddenly having slurred speech, however facial droop and weak. EMS was called their assessment patient having left gaze preference with right-sided neglect, right hemiparesis and aphasia. Blood pressure was 628 systolic. Arrived to [name], ED as a code stroke.  On arrival,patient was taken medially to CT scan. Stat CT head was obtained which showed significant atrophy but no acute ischemic changes/hemorrhage. Patient was administered IV TPA after discussing risk versus benefit with daughter. CT angiogram was obtained which showed no large vessel occlusion however so severe intracranial atherosclerotic disease. As patient continued to have significant deficits despite IV TPA administration, CT perfusion was performed to assess if there was a thrombus that could have been missed on CTA. CT perfusion demonstrated core/penumbra in the ACA territory with possible occlusion of the A2 segment,not a candidate for thrombectomy due to distal site of occlusion  Past medical records are not available as patient seeks medical care at the [place].  Today's Discussion (05/01/2019): Chart reviewed. Met with Mr. Seidman at bedside. He was able to track me around the room, mumbled a bit this morning though it was indiscernible. Patient remains on RA. VSS this morning.Cr appears about the same post bolus (yesterday). Will coordinate with nursing for patient to get OOB. He truly requires 1:1 feeding to get adequate intake, supplemental nutrition was added yesterday.  Have called patients personal phone and cell phone  to offer support. Awaiting to hear back from her. A voicemail was left on each answering service.    From a social perspective patient remains in a tenuous scenario as he does not have proper insurance to supplement a SNF stay.  Per neurology patient started on sinemet today.      Vital Signs Vitals:   05/01/19 0412 05/01/19 0826  BP: (!) 142/71 (!) 142/62  Pulse: 74 84  Resp: 17 20  Temp: 97.8 F (36.6 C) 98.7 F (37.1 C)  SpO2: 98% 97%    Intake/Output Summary (Last 24 hours) at 05/01/2019 3662 Last data filed at 05/01/2019 0827 Gross per 24 hour  Intake 850 ml  Output 800 ml  Net 50 ml   Last Weight  Most recent update: 04/17/2019  4:28 AM   Weight  62.3 kg (137 lb 5.6 oz)           Physical Exam Vitals and nursing note reviewed.  HENT:     Head: Normocephalic.     Nose: Nose normal.     Mouth/Throat:     Mouth: Mucous membranes are dry.  Eyes:     Pupils: Pupils are equal, round, and reactive to light.  Cardiovascular:     Rate and Rhythm: Tachycardia present.     Pulses: Normal pulses.  Pulmonary:     Effort: Pulmonary effort is normal.  Abdominal:     Palpations: Abdomen is soft.  Musculoskeletal:     Cervical back: Normal range of motion.  Skin:    General: Skin is warm.     Capillary Refill: Capillary refill takes less than 2 seconds.  Neurological:     Mental Status: He is alert.     Comments: Global apahasia  Psychiatric:  Speech: He is able to mumble slightly more today though does not make sense  SUMMARY OF RECOMMENDATIONS   Full Code / Full scope  TOC - Outpatient referral to [facility] - complicated discharge d/t lack of insurance coverage  Symptom Management:  Muscular Weakness in the setting of Left ACA Infarct: - PT - OT Dysphagia:  Failure to Thrive:                 - Speech consult completed, passed evaluation                 - D2 Diet w. Thins  - 1:1 Feeding  -  Supplementals Xerostomia: - Good oral care QShift - Encourage liquid intake Constipation:                 - Senna S BID Delirium Precuations:  - Get up during the day - Encourage a familiar face to remain present throughout the day - Keep blinds open and lights on during daylight hours - Minimize the use of opioids/benzodiazepines Spiritual: - Chaplain consult  Social:                 - Appreciate SW help. Patient has [employer] though is not service connected therefore cannot go to SNF.   Time Spent: 15 Greater than 50% of the time was spent in counseling and coordination of care ______________________________________________________________________________________ [place] Team Team Cell Phone: [phone] Please utilize secure chat with additional questions, if there is no response within 30 minutes please call the above phone number  Palliative Medicine Team providers are available by phone from 7am to 7pm daily and can be reached through the team cell phone.  Should this patient require assistance outside of these hours, please call the patient's attending physician.

## 2019-05-02 LAB — BASIC METABOLIC PANEL
Anion gap: 11 (ref 5–15)
BUN: 26 mg/dL — ABNORMAL HIGH (ref 8–23)
CO2: 23 mmol/L (ref 22–32)
Calcium: 8.6 mg/dL — ABNORMAL LOW (ref 8.9–10.3)
Chloride: 100 mmol/L (ref 98–111)
Creatinine, Ser: 1.2 mg/dL (ref 0.61–1.24)
GFR calc Af Amer: >60 mL/min (ref >60)
GFR calc non Af Amer: 54 mL/min — ABNORMAL LOW (ref >60)
Glucose, Bld: 97 mg/dL (ref 70–99)
Potassium: 4.6 mmol/L (ref 3.5–5.1)
Sodium: 134 mmol/L — ABNORMAL LOW (ref 135–145)

## 2019-05-02 LAB — CBC
HCT: 34.8 % — ABNORMAL LOW (ref 39.0–52.0)
Hemoglobin: 11.9 g/dL — ABNORMAL LOW (ref 13.0–17.0)
MCH: 31.8 pg (ref 26.0–34.0)
MCHC: 34.2 g/dL (ref 30.0–36.0)
MCV: 93 fL (ref 80.0–100.0)
Platelets: 322 10*3/uL (ref 150–400)
RBC: 3.74 MIL/uL — ABNORMAL LOW (ref 4.22–5.81)
RDW: 12 % (ref 11.5–15.5)
WBC: 6.6 10*3/uL (ref 4.0–10.5)
nRBC: 0 % (ref 0.0–0.2)

## 2019-05-02 NOTE — Progress Notes (Signed)
STROKE TEAM PROGRESS NOTE   INTERVAL HISTORY Patient lying in bed, comfortably.  No neuro changes, no acute event overnight.  Smiling in the left to provider.  Still global aphasia and attempts to following commands.     OBJECTIVE Vitals:   05/01/19 2038 05/01/19 2337 05/02/19 0414 05/02/19 0856  BP: 129/69 (!) 128/59 (!) 144/64 (!) 134/55  Pulse: 93 85 71 79  Resp: 18 17 17 18   Temp: 98.4 F (36.9 C) 98.2 F (36.8 C) 98.3 F (36.8 C) 98.1 F (36.7 C)  TempSrc: Oral Oral Oral Oral  SpO2: 98% 96% 96% 98%  Weight:       CBC:  Recent Labs  Lab 05/01/19 0240 05/02/19 0404  WBC 6.4 6.6  HGB 12.4* 11.9*  HCT 35.3* 34.8*  MCV 91.0 93.0  PLT 335 322   Basic Metabolic Panel:  Recent Labs  Lab 05/01/19 0240 05/02/19 0404  NA 134* 134*  K 4.2 4.6  CL 102 100  CO2 24 23  GLUCOSE 110* 97  BUN 28* 26*  CREATININE 1.28* 1.20  CALCIUM 8.5* 8.6*    IMAGING past 24h No results found.   PHYSICAL EXAM   General - Well nourished, well developed elderly caucasian male, in no apparent distress.    Ophthalmologic - fundi not visualized due to noncooperation.  Cardiovascular - Regular rhythm and rate.  Neuro - awake alert, making eye contact, tracking bilaterally. Global aphasia, did attempt follow central or peripheral commands today. Able to laugh but no speech output.  No gaze preference, blinking to visual threat bilaterally. Right facial droop.  Tongue midline.  Left upper extremity 4/5, left lower extremity at least 3-/5.  Right upper extremity 2/5 with significantly increased muscle tone, and right lower extremity mild withdrawal on pain stimulation 2+/5.  No Babinski. Sensation, coordination and gait not tested. Global bradykinesia and rigidity.    ASSESSMENT/PLAN [name] is a 86 y.o. male with history of hypertension and  mild dementia (medical care at [facility]), presents to the emergency department with sudden onset of slurred speech, aphasia, left gaze preference  with right-sided neglect, right hemiparesis, facial droop and weakness. He received IV t-PA Friday 04/16/19 at 2230.  Stroke: Left ACA territory infarct, likely due to large vessel disease although cardioembolic source not excluded  Code Stroke CT Head - No acute intracranial abnormality, multiple remote lacunar infarcts involving the bilateral basal ganglia, with chronic left cerebellar infarcts.   MRI head - Moderate-sized acute ischemic nonhemorrhagic left ACA territory infarct  CTA H&N - severe distal Left ICA siphon stenosis due to bulky calcified plaque, multifocal Left MCA irregularity and stenoses, up to severe stenoses of both distal VAs, and the Left PCA. bilateral MCA M3, and Left ACA A2 severe stenoses.   EEG - cortical dysfunction in left frontotemporal region, no seizures.  2D Echo - EF 70-75%. No intracardiac source of embolism   LE venous doppler  Negative for DVT   Consider 30 day cardiac event monitoring as outpt  04/18/19 Virus 2 - negative  LDL - 113  HgbA1c - 5.3  UDS - negative  VTE prophylaxis - lovenox  No antithrombotic prior to admission, now on ASA 325 and plavix DAPT for 3 months and then ASA alone given significant intracranial stenosis  Ongoing aggressive stroke risk factor management  Palliative consult 04/23/19 - Full Code - plan OP referral to Hospice  Therapy recommendations: CIR->SNF   Disposition:  Pending - pt without insurance coverage  Medically ready for d/c once d/c plan determined.   Hypertension  Home BP meds: Zestril 2.5  On lisinopril to 10 -> 20  On norvasc 5mg   BP stable 130-140s . Long-term BP goal normotensive  Hyperlipidemia  Home Lipid lowering medication: none  LDL 113, goal < 70  Current lipid lowering medication: on lipitor 40  Continue statin at discharge  Dysphagia, resolved . Secondary to stroke . D1 thin liquids->D2 thin liquids . Speech on board . Did not eat well today as per RN . Will give  fluid bolus and IV fluid  Leukocytosis, resolved Mild tachycardia and tachypnea  T-max 99.2  WBC 6.4-12.0-8.9   UA negative 2/10, UA repeat - normal except small amt of Hgb on urine dipstick  CXR NAD 2/10, repeat 04/30/19 - Stable exam, no acute process.  Fluid bolus with IV fluid  Close monitoring  Other Stroke Risk Factors  Advanced age  Hx stroke/TIA - by imaging  Other Active Problems  CKD - stage 3a, Cre 1.34->1.24->1.34->1.19->1.29 - fluid bolus and IV fluid->1.20  Dementia - on galantamine and Risperdal   Fall - right knee bruise  Palliative Care onboard - they will re-evaluate pt next week.  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   Hospital day # 16    To contact Stroke Continuity provider, please refer to [email or web address] After hours, contact General Neurology

## 2019-05-02 NOTE — Progress Notes (Signed)
Palliative Medicine Inpatient Follow Up Note  HPI: Per intake H&P --> Scott Gilbert a 86 y.o.malewith past medical history significant for hypertension, mild dementia presents to the emergency department with sudden onset of slurred speech and aphasia, facial droop and weakness.  According to the patient's daughter,patient was his normal self when around 9 PM family witnessed patient suddenly having slurred speech, however facial droop and weak. EMS was called their assessment patient having left gaze preference with right-sided neglect, right hemiparesis and aphasia. Blood pressure was 440 systolic. Arrived to [name], ED as a code stroke.  On arrival,patient was taken medially to CT scan. Stat CT head was obtained which showed significant atrophy but no acute ischemic changes/hemorrhage. Patient was administered IV TPA after discussing risk versus benefit with daughter. CT angiogram was obtained which showed no large vessel occlusion however so severe intracranial atherosclerotic disease. As patient continued to have significant deficits despite IV TPA administration, CT perfusion was performed to assess if there was a thrombus that could have been missed on CTA. CT perfusion demonstrated core/penumbra in the ACA territory with possible occlusion of the A2 segment,not a candidate for thrombectomy due to distal site of occlusion  Past medical records are not available as patient seeks medical care at the [place].  Today's Discussion (05/02/2019): Chart reviewed. Met with Scott Gilbert at bedside. He was more alert today it appears than on the day prior. He was able to state some words though they did not make great sense. Per discussion with neurology yesterday he was started on sinemet yesterday as a trial for possible vascular parkinsonism. The plan will be to reconvene on Wednesday to see if he has made some improvements.   Was able to touch base with social work who are still  working with the family to get him signed up for medicare/medicaid.    Vital Signs Vitals:   05/02/19 0414 05/02/19 0856  BP: (!) 144/64 (!) 134/55  Pulse: 71 79  Resp: 17 18  Temp: 98.3 F (36.8 C) 98.1 F (36.7 C)  SpO2: 96% 98%    Intake/Output Summary (Last 24 hours) at 05/02/2019 0906 Last data filed at 05/02/2019 0415 Gross per 24 hour  Intake 460 ml  Output 625 ml  Net -165 ml   Last Weight  Most recent update: 04/17/2019  4:28 AM   Weight  62.3 kg (137 lb 5.6 oz)           Physical Exam Vitals and nursing note reviewed.  HENT:     Head: Normocephalic.     Nose: Nose normal.     Mouth/Throat:     Mouth: Mucous membranes are dry.  Eyes:     Pupils: Pupils are equal, round, and reactive to light.  Cardiovascular:     Rate and Rhythm: Tachycardia present.     Pulses: Normal pulses.  Pulmonary:     Effort: Pulmonary effort is normal.  Abdominal:     Palpations: Abdomen is soft.  Musculoskeletal:     Cervical back: Normal range of motion.  Skin:    General: Skin is warm.     Capillary Refill: Capillary refill takes less than 2 seconds.  Neurological:     Mental Status: He is alert.     Comments: Global apahasia  Psychiatric:        Speech: He is able to mumble slightly more today though does not make sense  SUMMARY OF RECOMMENDATIONS   Full Code / Full scope  Social - complicated discharge d/t lack of insurance coverage. Social work is trying help patients family get patient enrolled in medicare/medicaid.   Symptom Management:  Muscular Weakness in the setting of Left ACA Infarct: - PT - OT Dysphagia:  Failure to Thrive:                 - Speech consult completed, passed evaluation                 - D2 Diet w. Thins  - 1:1 Feeding  - Supplementals Xerostomia: - Good oral care QShift - Encourage liquid intake Constipation:                 - Senna S BID Delirium  Precuations:  - Get up during the day - Encourage a familiar face to remain present throughout the day - Keep blinds open and lights on during daylight hours - Minimize the use of opioids/benzodiazepines Spiritual: - Chaplain consult  Social:                 - Appreciate SW help. Patient has [employer] though is not service connected therefore cannot go to SNF.   Time Spent: 15 Greater than 50% of the time was spent in counseling and coordination of care ______________________________________________________________________________________ [place] Team Team Cell Phone: [phone] Please utilize secure chat with additional questions, if there is no response within 30 minutes please call the above phone number  Palliative Medicine Team providers are available by phone from 7am to 7pm daily and can be reached through the team cell phone.  Should this patient require assistance outside of these hours, please call the patient's attending physician.

## 2019-05-03 LAB — CBC
HCT: 39.1 % (ref 39.0–52.0)
Hemoglobin: 13.5 g/dL (ref 13.0–17.0)
MCH: 31.5 pg (ref 26.0–34.0)
MCHC: 34.5 g/dL (ref 30.0–36.0)
MCV: 91.4 fL (ref 80.0–100.0)
Platelets: 355 10*3/uL (ref 150–400)
RBC: 4.28 MIL/uL (ref 4.22–5.81)
RDW: 11.7 % (ref 11.5–15.5)
WBC: 6.4 10*3/uL (ref 4.0–10.5)
nRBC: 0 % (ref 0.0–0.2)

## 2019-05-03 LAB — BASIC METABOLIC PANEL
Anion gap: 12 (ref 5–15)
BUN: 26 mg/dL — ABNORMAL HIGH (ref 8–23)
CO2: 24 mmol/L (ref 22–32)
Calcium: 8.8 mg/dL — ABNORMAL LOW (ref 8.9–10.3)
Chloride: 99 mmol/L (ref 98–111)
Creatinine, Ser: 1.31 mg/dL — ABNORMAL HIGH (ref 0.61–1.24)
GFR calc Af Amer: 56 mL/min — ABNORMAL LOW (ref >60)
GFR calc non Af Amer: 49 mL/min — ABNORMAL LOW (ref >60)
Glucose, Bld: 101 mg/dL — ABNORMAL HIGH (ref 70–99)
Potassium: 4.2 mmol/L (ref 3.5–5.1)
Sodium: 135 mmol/L (ref 135–145)

## 2019-05-03 MED ORDER — AMLODIPINE BESYLATE 10 MG PO TABS
10.0000 mg | ORAL_TABLET | Freq: Every day | ORAL | Status: DC
  Administered 2019-05-04 – 2019-07-02 (×60): 10 mg via ORAL
  Filled 2019-05-03 (×27): qty 1
  Filled 2019-05-03: qty 2
  Filled 2019-05-03 (×28): qty 1
  Filled 2019-05-03: qty 2
  Filled 2019-05-03 (×3): qty 1

## 2019-05-03 NOTE — Progress Notes (Signed)
Physical Therapy Treatment Patient Details Name: Scott Gilbert MRN: [id number] DOB: 18-Feb-1930 Today's Date: 05/03/2019    History of Present Illness 86 y.o. male with past medical history significant for hypertension and mild dementia. He presented to the emergency department with sudden onset of slurred speech and aphasia, facial droop and weakness.   CT cerebral perfusion on 04/16/19 reported: "acute distal Left ACA territory ischemia and infarct."    PT Comments    Patient received in bed, alert, non verbal. Performed bed exercises, found to be soiled in bed, assisted with NT to clean patient and change linens/gown. Patient requires mod/max assist with rolling and mod assist with supine to sit. Able to sit with close supervision, occasional lob posteriorly. Patient requires max assist to transfer to recliner. Follows cues inconsistently. Patient will continue to benefit from skilled PT to improve functional independence.      Follow Up Recommendations  CIR;Supervision/Assistance - 24 hour     Equipment Recommendations  Wheelchair (measurements PT)    Recommendations for Other Services Rehab consult     Precautions / Restrictions Precautions Precautions: Fall Restrictions Weight Bearing Restrictions: No    Mobility  Bed Mobility Overal bed mobility: Needs Assistance Bed Mobility: Rolling;Supine to Sit Rolling: Mod assist;+2 for physical assistance;Max assist   Supine to sit: Mod assist;HOB elevated     General bed mobility comments: mod assist to roll to his right, max assist to roll to his left. mod assist to go from supine to sit. Close supervision to occasional assist with sitting balance  Transfers Overall transfer level: Needs assistance Equipment used: None Transfers: Stand Pivot Transfers;Sit to/from Stand Sit to Stand: Max assist Stand pivot transfers: Max assist       General transfer comment: max assist to stand pivot to his left.  Ambulation/Gait              General Gait Details: unable   Stairs             Wheelchair Mobility    Modified Rankin (Stroke Patients Only) Modified Rankin (Stroke Patients Only) Pre-Morbid Rankin Score: No symptoms Modified Rankin: Moderately severe disability     Balance Overall balance assessment: Needs assistance Sitting-balance support: Single extremity supported;Feet supported Sitting balance-Leahy Scale: Fair Sitting balance - Comments: able to sit unsupported, but when trying to move he loses balance posteriorly Postural control: Right lateral lean;Posterior lean Standing balance support: Bilateral upper extremity supported;During functional activity Standing balance-Leahy Scale: Poor Standing balance comment: reliant on external support and max A                            Cognition Arousal/Alertness: Awake/alert Behavior During Therapy: WFL for tasks assessed/performed Overall Cognitive Status: Difficult to assess                                 General Comments: Pt inconsistently follows one step commands, gesturing      Exercises Other Exercises Other Exercises: Supine LE exercises: ap, heel slides, hip abd/add with assist x 10 reps bilaterally.    General Comments General comments (skin integrity, edema, etc.): patient found soiled in bed upon arrival. NT and I assisted patient with pericare and gown/linen change.      Pertinent Vitals/Pain Pain Assessment: Faces Pain Score: 0-No pain    Home Living  Prior Function            PT Goals (current goals can now be found in the care plan section) Acute Rehab PT Goals Patient Stated Goal: unable to state PT Goal Formulation: Patient unable to participate in goal setting Time For Goal Achievement: 05/17/19 Potential to Achieve Goals: Fair Progress towards PT goals: Progressing toward goals    Frequency    Min 3X/week      PT Plan Current plan  remains appropriate    Co-evaluation              AM-PAC PT "6 Clicks" Mobility   Outcome Measure  Help needed turning from your back to your side while in a flat bed without using bedrails?: A Lot Help needed moving from lying on your back to sitting on the side of a flat bed without using bedrails?: A Lot Help needed moving to and from a bed to a chair (including a wheelchair)?: Total Help needed standing up from a chair using your arms (e.g., wheelchair or bedside chair)?: Total Help needed to walk in hospital room?: Total Help needed climbing 3-5 steps with a railing? : Total 6 Click Score: 8    End of Session Equipment Utilized During Treatment: Gait belt Activity Tolerance: Patient tolerated treatment well Patient left: in chair;with call bell/phone within reach;with chair alarm set Nurse Communication: Mobility status PT Visit Diagnosis: Other abnormalities of gait and mobility (R26.89);Hemiplegia and hemiparesis;Muscle weakness (generalized) (M62.81) Hemiplegia - Right/Left: Right Hemiplegia - caused by: Cerebral infarction     Time: 0930-1011 PT Time Calculation (min) (ACUTE ONLY): 41 min  Charges:  $Therapeutic Exercise: 8-22 mins $Therapeutic Activity: 8-22 mins                     Marteze Vecchio, PT, GCS 05/03/19,10:32 AM

## 2019-05-03 NOTE — Progress Notes (Signed)
STROKE TEAM PROGRESS NOTE   INTERVAL HISTORY Pt lying in bed. Initially sleeping but easily arousable, once awake, he smile to me and tracking bilaterally. No neuro changes, still on IVF @ 50. Labs and vitals stable.     OBJECTIVE Vitals:   05/02/19 2015 05/02/19 2353 05/03/19 0416 05/03/19 0854  BP: (!) 139/59 (!) 145/69 (!) 185/82 (!) 166/69  Pulse: 93 98 93 85  Resp: 18 19 18 16   Temp: 98 F (36.7 C) 98.3 F (36.8 C) 97.7 F (36.5 C) (!) 97.4 F (36.3 C)  TempSrc: Oral Oral Oral Oral  SpO2: 98% 95% 98% 97%  Weight:       CBC:  Recent Labs  Lab 05/02/19 0404 05/03/19 0424  WBC 6.6 6.4  HGB 11.9* 13.5  HCT 34.8* 39.1  MCV 93.0 91.4  PLT 322 355   Basic Metabolic Panel:  Recent Labs  Lab 05/02/19 0404 05/03/19 0424  NA 134* 135  K 4.6 4.2  CL 100 99  CO2 23 24  GLUCOSE 97 101*  BUN 26* 26*  CREATININE 1.20 1.31*  CALCIUM 8.6* 8.8*    IMAGING past 24h No results found.   PHYSICAL EXAM   General - Well nourished, well developed elderly caucasian male, in no apparent distress.    Ophthalmologic - fundi not visualized due to noncooperation.  Cardiovascular - Regular rhythm and rate.  Neuro - easily arousable, awake alert, making eye contact, tracking bilaterally. Global aphasia, did attempt follow central or peripheral commands today. Able to laugh but no speech output.  No gaze preference, blinking to visual threat bilaterally. Right facial droop.  Tongue midline.  Left upper extremity 4/5, left lower extremity at least 3-/5.  Right upper extremity 2/5 with significantly increased muscle tone, and right lower extremity mild withdrawal on pain stimulation 2+/5.  No Babinski. Sensation, coordination and gait not tested. Global bradykinesia and rigidity.    ASSESSMENT/PLAN Scott Gilbert is a 86 y.o. male with history of hypertension and  mild dementia (medical care at [facility]), presents to the emergency department with sudden onset of slurred speech, aphasia,  left gaze preference with right-sided neglect, right hemiparesis, facial droop and weakness. He received IV t-PA Friday 04/16/19 at 2230.  Stroke: Left ACA territory infarct, likely due to large vessel disease although cardioembolic source not excluded  Code Stroke CT Head - No acute intracranial abnormality, multiple remote lacunar infarcts involving the bilateral basal ganglia, with chronic left cerebellar infarcts.   MRI head - Moderate-sized acute ischemic nonhemorrhagic left ACA territory infarct  CTA H&N - severe distal Left ICA siphon stenosis due to bulky calcified plaque, multifocal Left MCA irregularity and stenoses, up to severe stenoses of both distal VAs, and the Left PCA. bilateral MCA M3, and Left ACA A2 severe stenoses.   EEG - cortical dysfunction in left frontotemporal region, no seizures.  2D Echo - EF 70-75%. No intracardiac source of embolism   LE venous doppler  Negative for DVT   Consider 30 day cardiac event monitoring as outpt  04/18/19 Virus 2 - negative  LDL - 113  HgbA1c - 5.3  UDS - negative  VTE prophylaxis - lovenox  No antithrombotic prior to admission, now on ASA 325 and plavix DAPT for 3 months and then ASA alone given significant intracranial stenosis  Ongoing aggressive stroke risk factor management  Palliative consult 04/23/19 - Full Code - plan OP referral to Hospice. Continue to follow as an IP, last saw 05/02/2019. Plan f/u  after trial Sinemet  Therapy recommendations: CIR->SNF   Disposition:  Pending - pt without insurance coverage   Medically ready for d/c once d/c plan determined.   Hypertension  Home BP meds: Zestril 2.5  On lisinopril to 10 -> 20 mg  On norvasc 5 -> 10 mg  BP stable 150s . Long-term BP goal normotensive  Hyperlipidemia  Home Lipid lowering medication: none  LDL 113, goal < 70  Current lipid lowering medication: on lipitor 40  Continue statin at discharge  Dysphagia . Secondary to stroke . D1  thin liquids->D2 thin liquids . Speech on board . Decreased po intake -> On IV fluid @ 50  Leukocytosis, resolved Mild tachycardia and tachypnea, resolved  T-max 99.3  WBC 6.4->6.6->6.4  UA negative 2/10, UA repeat unremarkable  CXR NAD 2/10, repeat 04/30/19 - neg.  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   Hospital day # 17  Scott Hawking, MD PhD Stroke Neurology 05/03/2019 7:17 PM     To contact Stroke Continuity provider, please refer to [email or web address] After hours, contact General Neurology

## 2019-05-04 LAB — CBC
HCT: 35.2 % — ABNORMAL LOW (ref 39.0–52.0)
Hemoglobin: 11.9 g/dL — ABNORMAL LOW (ref 13.0–17.0)
MCH: 31.6 pg (ref 26.0–34.0)
MCHC: 33.8 g/dL (ref 30.0–36.0)
MCV: 93.6 fL (ref 80.0–100.0)
Platelets: 353 10*3/uL (ref 150–400)
RBC: 3.76 MIL/uL — ABNORMAL LOW (ref 4.22–5.81)
RDW: 11.9 % (ref 11.5–15.5)
WBC: 6.4 10*3/uL (ref 4.0–10.5)
nRBC: 0 % (ref 0.0–0.2)

## 2019-05-04 LAB — BASIC METABOLIC PANEL
Anion gap: 6 (ref 5–15)
BUN: 32 mg/dL — ABNORMAL HIGH (ref 8–23)
CO2: 24 mmol/L (ref 22–32)
Calcium: 8.4 mg/dL — ABNORMAL LOW (ref 8.9–10.3)
Chloride: 103 mmol/L (ref 98–111)
Creatinine, Ser: 1.57 mg/dL — ABNORMAL HIGH (ref 0.61–1.24)
GFR calc Af Amer: 45 mL/min — ABNORMAL LOW (ref >60)
GFR calc non Af Amer: 39 mL/min — ABNORMAL LOW (ref >60)
Glucose, Bld: 122 mg/dL — ABNORMAL HIGH (ref 70–99)
Potassium: 4.7 mmol/L (ref 3.5–5.1)
Sodium: 133 mmol/L — ABNORMAL LOW (ref 135–145)

## 2019-05-04 NOTE — Progress Notes (Signed)
Physical Therapy Treatment Patient Details Name: Scott Gilbert MRN: [id number] DOB: 04/21/1929 Today's Date: 05/04/2019    History of Present Illness 86 y.o. male with past medical history significant for hypertension and mild dementia. He presented to the emergency department with sudden onset of slurred speech and aphasia, facial droop and weakness.   CT cerebral perfusion on 04/16/19 reported: "acute distal Left ACA territory ischemia and infarct."    PT Comments    Pt performed gt progress this session with heavy external assistance to advance RLE forward.  Close chair follow required as he fatigues quickly.  Continue to recommend aggressive CIR therapies before returning home.    Follow Up Recommendations  CIR;Supervision/Assistance - 24 hour     Equipment Recommendations  Wheelchair (measurements PT)    Recommendations for Other Services       Precautions / Restrictions Precautions Precautions: Fall Restrictions Weight Bearing Restrictions: No    Mobility  Bed Mobility Overal bed mobility: Needs Assistance Bed Mobility: Supine to Sit     Supine to sit: Mod assist;+2 for physical assistance     General bed mobility comments: pt initiating advancing BLEs to EOB but required MOD A to scoot hips to EOB and elevate trunk  Transfers Overall transfer level: Needs assistance Equipment used: Rolling walker (2 wheeled) Transfers: Sit to/from Stand Sit to Stand: Mod assist;+2 physical assistance         General transfer comment: pt completed x2 sit<>stands with RW needing assist to place RUE on RW; MOD A to power into standing.  Once initiating any dynamic movement R knee buckles.  Ambulation/Gait Ambulation/Gait assistance: Max assist;+2 physical assistance Gait Distance (Feet): 6 Feet(+10 ft.) Assistive device: Rolling walker (2 wheeled) Gait Pattern/deviations: Step-to pattern;Decreased stance time - right;Decreased dorsiflexion - right;Narrow base of  support Gait velocity: decreased   General Gait Details: R knee buckles in R stance phase.  Pt required assistance to advance RLE forward he would benefit from a PRAFO and KI to promote better quality gt.  Close chair follow noted for safety as he fatigues quickly.   Stairs             Wheelchair Mobility    Modified Rankin (Stroke Patients Only) Modified Rankin (Stroke Patients Only) Pre-Morbid Rankin Score: No symptoms Modified Rankin: Moderately severe disability     Balance Overall balance assessment: Needs assistance Sitting-balance support: Single extremity supported;Feet supported Sitting balance-Leahy Scale: Fair     Standing balance support: Bilateral upper extremity supported;During functional activity Standing balance-Leahy Scale: Poor Standing balance comment: reliant on MAX A +2 for functional mobility                            Cognition Arousal/Alertness: Awake/alert Behavior During Therapy: WFL for tasks assessed/performed Overall Cognitive Status: History of cognitive impairments - at baseline                                 General Comments: pt nonverbal noted to laugh spontaneously during session      Exercises General Exercises - Upper Extremity Shoulder Horizontal ABduction: PROM;Right;5 reps;Seated Shoulder Horizontal ADduction: PROM;Right;5 reps;Seated Elbow Flexion: PROM;Right;5 reps;Seated Elbow Extension: PROM;Right;5 reps;Seated Digit Composite Flexion: PROM;Right;5 reps;Seated Composite Extension: PROM;Right;5 reps;Seated    General Comments        Pertinent Vitals/Pain Pain Assessment: Faces Faces Pain Scale: Hurts little more Pain Location: with RUE ROm Pain  Descriptors / Indicators: Grimacing;Guarding Pain Intervention(s): Monitored during session;Repositioned    Home Living                      Prior Function            PT Goals (current goals can now be found in the care plan  section) Acute Rehab PT Goals Patient Stated Goal: unable to state Potential to Achieve Goals: Fair Progress towards PT goals: Progressing toward goals    Frequency    Min 3X/week      PT Plan Current plan remains appropriate    Co-evaluation PT/OT/SLP Co-Evaluation/Treatment: Yes Reason for Co-Treatment: Complexity of the patient's impairments (multi-system involvement) PT goals addressed during session: Mobility/safety with mobility OT goals addressed during session: ADL's and self-care      AM-PAC PT "6 Clicks" Mobility   Outcome Measure  Help needed turning from your back to your side while in a flat bed without using bedrails?: A Lot Help needed moving from lying on your back to sitting on the side of a flat bed without using bedrails?: A Lot Help needed moving to and from a bed to a chair (including a wheelchair)?: Total Help needed standing up from a chair using your arms (e.g., wheelchair or bedside chair)?: Total Help needed to walk in hospital room?: Total Help needed climbing 3-5 steps with a railing? : Total 6 Click Score: 8    End of Session Equipment Utilized During Treatment: Gait belt Activity Tolerance: Patient tolerated treatment well Patient left: in chair;with call bell/phone within reach;with chair alarm set Nurse Communication: Mobility status PT Visit Diagnosis: Other abnormalities of gait and mobility (R26.89);Hemiplegia and hemiparesis;Muscle weakness (generalized) (M62.81) Hemiplegia - Right/Left: Right Hemiplegia - caused by: Cerebral infarction     Time: 4270-6237 PT Time Calculation (min) (ACUTE ONLY): 28 min  Charges:  $Gait Training: 8-22 mins                     Bonney Leitz , PTA Acute Rehabilitation Services Pager [phone] Office [phone]     Sherene Plancarte Artis Delay 05/04/2019, 4:19 PM

## 2019-05-04 NOTE — Progress Notes (Signed)
SLP Cancellation Note  Patient Details Name: Scott Gilbert MRN: [id number] DOB: Jun 08, 1929   Cancelled treatment:       Reason Eval/Treat Not Completed: Patient at procedure or test/unavailable ST attempted treatment session in morning. Chaplain visiting with patient. ST to re-attempt as schedule allows.  Shella Spearing, M.Ed., CCC-SLP Speech Therapy Acute Rehabilitation [phone]: Acute Rehab office [phone] - pager    Shella Spearing 05/04/2019,

## 2019-05-04 NOTE — Progress Notes (Signed)
STROKE TEAM PROGRESS NOTE   INTERVAL HISTORY Patient is sitting up in a bedside chair.  He remains globally aphasic.  He does track visually and socially smiles and follows some commands to mimicking only. Vital signs are stable.  No changes OBJECTIVE Vitals:   05/04/19 0030 05/04/19 0428 05/04/19 0744 05/04/19 1151  BP: (!) 160/71 (!) 155/60 (!) 142/75 140/60  Pulse: 82 78 77 80  Resp:  19 16 18   Temp: 98.7 F (37.1 C) 98.1 F (36.7 C) (!) 97.4 F (36.3 C) 98.5 F (36.9 C)  TempSrc: Oral Oral Oral Oral  SpO2: 99% 97% 97% 97%  Weight:       CBC:  Recent Labs  Lab 05/03/19 0424 05/04/19 0225  WBC 6.4 6.4  HGB 13.5 11.9*  HCT 39.1 35.2*  MCV 91.4 93.6  PLT 355 621   Basic Metabolic Panel:  Recent Labs  Lab 05/03/19 0424 05/04/19 0225  NA 135 133*  K 4.2 4.7  CL 99 103  CO2 24 24  GLUCOSE 101* 122*  BUN 26* 32*  CREATININE 1.31* 1.57*  CALCIUM 8.8* 8.4*    IMAGING past 24h No results found.   PHYSICAL EXAM    General - Well nourished, well developed elderly caucasian male, in no apparent distress.    Ophthalmologic - fundi not visualized due to noncooperation.  Cardiovascular - Regular rhythm and rate.  Neuro - easily arousable, awake alert, making eye contact, tracking bilaterally. Global aphasia, did attempt follow central or peripheral commands today. Able to laugh but no speech output.  No gaze preference, blinking to visual threat bilaterally. Right facial droop.  Tongue midline.  Left upper extremity 4/5, left lower extremity at least 3-/5.  Right upper extremity 2/5 with significantly increased muscle tone, and right lower extremity mild withdrawal on pain stimulation 2+/5.  No Babinski. Sensation, coordination and gait not tested. Global bradykinesia and rigidity.    ASSESSMENT/PLAN [name] is a 86 y.o. male with history of hypertension and  mild dementia (medical care at [facility]), presents to the emergency department with sudden onset of  slurred speech, aphasia, left gaze preference with right-sided neglect, right hemiparesis, facial droop and weakness. He received IV t-PA Friday 04/16/19 at 2230.  Stroke: Left ACA territory infarct, likely due to large vessel disease although cardioembolic source not excluded  Code Stroke CT Head - No acute intracranial abnormality, multiple remote lacunar infarcts involving the bilateral basal ganglia, with chronic left cerebellar infarcts.   MRI head - Moderate-sized acute ischemic nonhemorrhagic left ACA territory infarct  CTA H&N - severe distal Left ICA siphon stenosis due to bulky calcified plaque, multifocal Left MCA irregularity and stenoses, up to severe stenoses of both distal VAs, and the Left PCA. bilateral MCA M3, and Left ACA A2 severe stenoses.   EEG - cortical dysfunction in left frontotemporal region, no seizures.  2D Echo - EF 70-75%. No intracardiac source of embolism   LE venous doppler  Negative for DVT   Consider 30 day cardiac event monitoring as outpt  [employer] Virus 2 - negative  LDL - 113  HgbA1c - 5.3  UDS - negative  VTE prophylaxis - lovenox  No antithrombotic prior to admission, now on ASA 325 and plavix DAPT for 3 months and then ASA alone given significant intracranial stenosis  Palliative consult 04/23/19 - Full Code - plan OP referral to Hospice. Continue to follow as an IP, last saw 05/02/2019. Plan f/u after trial Sinemet  Therapy recommendations:  CIR->SNF   Disposition:  Pending - pt without insurance coverage - SW involved  Medically ready for d/c once d/c plan determined.   Hypertension  Home BP meds: Zestril 2.5  On lisinopril to 10 -> 20 mg  On norvasc 5 -> 10 mg  BP stable 140-160s . Long-term BP goal normotensive  Hyperlipidemia  Home Lipid lowering medication: none  LDL 113, goal < 70  Current lipid lowering medication: on lipitor 40  Continue statin at discharge  Dysphagia . Secondary to stroke . D1 thin  liquids->D2 thin liquids . Speech on board . Decreased po intake -> On IV fluid @ ->increase to 75h  Mild tachycardia and tachypnea, resolved  afebrile  WBC 6.4  UA negative 2/10, UA repeat unremarkable  CXR NAD 2/10, repeat 04/30/19 - neg.  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  He continues to be difficult to place in SNF. D/y case Production designer, theatre/television/film .  [name], MD To contact Stroke Continuity provider, please refer to [email or web address]. After hours, contact General Neurology

## 2019-05-04 NOTE — Progress Notes (Signed)
Orthopedic Tech Progress Note Patient Details:  Scott Gilbert February 16, 1930 [id number]  Ortho Devices Type of Ortho Device: Knee Immobilizer Ortho Device/Splint Location: right Ortho Device/Splint Interventions: Application   Post Interventions Patient Tolerated: Well Instructions Provided: Care of device   [name] 05/04/2019, 5:57 PM

## 2019-05-04 NOTE — Progress Notes (Signed)
Occupational Therapy Treatment Patient Details Name: Scott Gilbert MRN: [id number] DOB: 1929-08-08 Today's Date: 05/04/2019    History of present illness 86 y.o. male with past medical history significant for hypertension and mild dementia. He presented to the emergency department with sudden onset of slurred speech and aphasia, facial droop and weakness.   CT cerebral perfusion on 04/16/19 reported: "acute distal Left ACA territory ischemia and infarct."   OT comments  Pt able to complete functional mobility with MAX A +2 with RW needing assist to place RUE on RW and advance RLE forward during mobility. Pt noted to buckle heavily during mobility needing close chair follow and MAX A +2. Pt complete x2 sit<>stands from EOB/ recliner with MODA +2. Completed PROM therex on RUE to facilitate increased ROM for BADL participation as indicated below. Pt overall nonverbal throughout session noted to laugh spontaneously occasionally. DC plan currently remains appropriate, will follow acutely per POC.    Follow Up Recommendations  CIR;Supervision/Assistance - 24 hour    Equipment Recommendations  Other (comment)(TBD)    Recommendations for Other Services      Precautions / Restrictions Precautions Precautions: Fall Restrictions Weight Bearing Restrictions: No       Mobility Bed Mobility Overal bed mobility: Needs Assistance Bed Mobility: Supine to Sit     Supine to sit: Mod assist;+2 for physical assistance     General bed mobility comments: pt initiating advancing BLEs to EOB but required MOD A to scoot hips to EOB and elevate trunk  Transfers Overall transfer level: Needs assistance Equipment used: Rolling walker (2 wheeled) Transfers: Sit to/from Stand Sit to Stand: Mod assist;+2 physical assistance         General transfer comment: pt completed x2 sit<>stands with RW needing assist to place RUE on RW; MOD A to power into standing    Balance Overall balance assessment:  Needs assistance Sitting-balance support: Single extremity supported;Feet supported Sitting balance-Leahy Scale: Fair     Standing balance support: Bilateral upper extremity supported;During functional activity Standing balance-Leahy Scale: Poor Standing balance comment: reliant on MAX A +2 for functional mobility                           ADL either performed or assessed with clinical judgement   ADL Overall ADL's : Needs assistance/impaired                         Toilet Transfer: Maximal assistance;+2 for physical assistance;+2 for safety/equipment Toilet Transfer Details (indicate cue type and reason): simulated via functional mobility with RW and MAX A +2; pt buckling in BLEs needing assist to advance RLE during functional mobility         Functional mobility during ADLs: Maximal assistance;+2 for physical assistance;+2 for safety/equipment General ADL Comments: session focus functional mobility as precursor to higher level ADLs. pt limited by decreased activity tolerance, decreased ability to care for self and cognitive deficits     Vision       Perception     Praxis      Cognition Arousal/Alertness: Awake/alert Behavior During Therapy: WFL for tasks assessed/performed Overall Cognitive Status: History of cognitive impairments - at baseline                                 General Comments: pt nonverbal noted to laugh spontaneously during session  Exercises General Exercises - Upper Extremity Shoulder Horizontal ABduction: PROM;Right;5 reps;Seated Shoulder Horizontal ADduction: PROM;Right;5 reps;Seated Elbow Flexion: PROM;Right;5 reps;Seated Elbow Extension: PROM;Right;5 reps;Seated Digit Composite Flexion: PROM;Right;5 reps;Seated Composite Extension: PROM;Right;5 reps;Seated   Shoulder Instructions       General Comments      Pertinent Vitals/ Pain       Pain Assessment: Faces Faces Pain Scale: Hurts little  more Pain Location: with RUE ROm Pain Descriptors / Indicators: Grimacing;Guarding Pain Intervention(s): Monitored during session;Repositioned  Home Living                                          Prior Functioning/Environment              Frequency  Min 2X/week        Progress Toward Goals  OT Goals(current goals can now be found in the care plan section)  Progress towards OT goals: Progressing toward goals  Acute Rehab OT Goals Patient Stated Goal: unable to state OT Goal Formulation: Patient unable to participate in goal setting Time For Goal Achievement: 05/15/19 Potential to Achieve Goals: Good  Plan Discharge plan remains appropriate    Co-evaluation                 AM-PAC OT "6 Clicks" Daily Activity     Outcome Measure   Help from another person eating meals?: A Lot Help from another person taking care of personal grooming?: A Little Help from another person toileting, which includes using toliet, bedpan, or urinal?: A Lot Help from another person bathing (including washing, rinsing, drying)?: A Lot Help from another person to put on and taking off regular upper body clothing?: A Little Help from another person to put on and taking off regular lower body clothing?: A Lot 6 Click Score: 14    End of Session Equipment Utilized During Treatment: Rolling walker  OT Visit Diagnosis: Unsteadiness on feet (R26.81);Muscle weakness (generalized) (M62.81)   Activity Tolerance Patient tolerated treatment well   Patient Left in chair;with call bell/phone within reach;with chair alarm set   Nurse Communication Mobility status        Time: 7673-4193 OT Time Calculation (min): 29 min  Charges: OT General Charges $OT Visit: 1 Visit OT Treatments $Therapeutic Activity: 8-22 mins  Lanier Clam., COTA/L Acute Rehabilitation Services [phone] [place] 05/04/2019, 4:16 PM

## 2019-05-05 LAB — CBC
HCT: 33.3 % — ABNORMAL LOW (ref 39.0–52.0)
Hemoglobin: 11.5 g/dL — ABNORMAL LOW (ref 13.0–17.0)
MCH: 31.3 pg (ref 26.0–34.0)
MCHC: 34.5 g/dL (ref 30.0–36.0)
MCV: 90.7 fL (ref 80.0–100.0)
Platelets: 322 10*3/uL (ref 150–400)
RBC: 3.67 MIL/uL — ABNORMAL LOW (ref 4.22–5.81)
RDW: 11.9 % (ref 11.5–15.5)
WBC: 6.3 10*3/uL (ref 4.0–10.5)
nRBC: 0 % (ref 0.0–0.2)

## 2019-05-05 LAB — BASIC METABOLIC PANEL
Anion gap: 8 (ref 5–15)
BUN: 25 mg/dL — ABNORMAL HIGH (ref 8–23)
CO2: 21 mmol/L — ABNORMAL LOW (ref 22–32)
Calcium: 7.9 mg/dL — ABNORMAL LOW (ref 8.9–10.3)
Chloride: 100 mmol/L (ref 98–111)
Creatinine, Ser: 1.29 mg/dL — ABNORMAL HIGH (ref 0.61–1.24)
GFR calc Af Amer: 57 mL/min — ABNORMAL LOW (ref >60)
GFR calc non Af Amer: 50 mL/min — ABNORMAL LOW (ref >60)
Glucose, Bld: 119 mg/dL — ABNORMAL HIGH (ref 70–99)
Potassium: 4.1 mmol/L (ref 3.5–5.1)
Sodium: 129 mmol/L — ABNORMAL LOW (ref 135–145)

## 2019-05-05 NOTE — Progress Notes (Signed)
  Speech Language Pathology Treatment: Cognitive-Linquistic;Dysphagia  Patient Details Name: Scott Gilbert MRN: [id number] DOB: 10-19-1929 Today's Date: 05/05/2019 Time: 6568-1275 SLP Time Calculation (min) (ACUTE ONLY): 22 min  Assessment / Plan / Recommendation Clinical Impression  Patient received at bedside for skilled ST. Patient upright in bed, oral care completed by ST. Patient's spontaneous utterances this date were comprised of laughing. Patient able to state "Werber" with max visual and verbal cues. Patient verbalized "Chirag" x1 when asked "what's your name?" Patient with NR when asked basic biographical and environmental yes/no questions. When provided with max verbal and visual (written) cues for CV words, patient visually scanning but was not able to verbalize the words. Patient able to follow one step commands to open mouth and grasp cup with max cues. Patient did not demonstrate any pointing this date, despite max cues. Oral groping observed.   Patient currently receiving IV fluids. Patient seen with cup and straw sips of thin liquids, no overt s/sx aspiration despite thorough challenging. Patient seen with puree solids and chopped peaches. Patient's oral phase notable for prolonged mastication with peaches, likely 2/2 edentulous status. ST to sign off for swallowing services as patient felt to be at safest diet.   ST to follow for speech goals as per POC.   HPI HPI: Scott Gilbert is a 86 y.o. male with past medical history significant for hypertension, mild dementia presented to the emergency department with sudden onset of slurred speech and aphasia, facial droop and weakness.   CT cerebral perfusion on 04/16/19 reported: "acute distal Left ACA territory ischemia and infarct."       SLP Plan  Continue with current plan of care       Recommendations  Diet recommendations: Dysphagia 2 (fine chop);Thin liquid Liquids provided via: Cup;Straw Medication Administration:  Crushed with puree Supervision: Full supervision/cueing for compensatory strategies Compensations: Minimize environmental distractions;Slow rate;Small sips/bites Postural Changes and/or Swallow Maneuvers: Seated upright 90 degrees                Oral Care Recommendations: Oral care BID Follow up Recommendations: 24 hour supervision/assistance;Skilled Nursing facility SLP Visit Diagnosis: Dysphagia, oral phase (R13.11);Aphasia (R47.01) Plan: Continue with current plan of care       GO                Scott Gilbert 05/05/2019, 12:04 PM  Shella Spearing, M.Ed., CCC-SLP Speech Therapy Acute Rehabilitation [phone]: Acute Rehab office [phone] - pager

## 2019-05-05 NOTE — Progress Notes (Signed)
I concur with the assessment entered by Jimmie Molly, SN NCATSU.

## 2019-05-05 NOTE — Progress Notes (Signed)
I followed up with [name] for pastoral support. [name] was alert and mute. However he did expression emotion with tears. I offered continued support with ministry of presence, prayer and words of encouragement. I will follow up with [name] later in the week.   Palliative care Chaplain  Orest Dikes MA [phone]

## 2019-05-05 NOTE — Progress Notes (Signed)
STROKE TEAM PROGRESS NOTE   INTERVAL HISTORY Patient is lying comfortably in bed he remains globally aphasic.  He does track visually and socially smiles and follows some commands to mimicking only. Vital signs are stable.  No changes OBJECTIVE Vitals:   05/04/19 2324 05/05/19 0426 05/05/19 0820 05/05/19 1207  BP: 132/65 (!) 155/60 (!) 159/67 (!) 131/53  Pulse: 81 79 87 81  Resp: 16 18 (!) 24 (!) 24  Temp: (!) 97.4 F (36.3 C) 98.5 F (36.9 C) 98.1 F (36.7 C) 98.3 F (36.8 C)  TempSrc: Oral Oral Oral Oral  SpO2: 96% 95% 97% 95%  Weight:       CBC:  Recent Labs  Lab 05/04/19 0225 05/05/19 0156  WBC 6.4 6.3  HGB 11.9* 11.5*  HCT 35.2* 33.3*  MCV 93.6 90.7  PLT 353 322   Basic Metabolic Panel:  Recent Labs  Lab 05/04/19 0225 05/05/19 0156  NA 133* 129*  K 4.7 4.1  CL 103 100  CO2 24 21*  GLUCOSE 122* 119*  BUN 32* 25*  CREATININE 1.57* 1.29*  CALCIUM 8.4* 7.9*    IMAGING past 24h No results found.   PHYSICAL EXAM    General - Well nourished, well developed elderly caucasian male, in no apparent distress.    Ophthalmologic - fundi not visualized due to noncooperation.  Cardiovascular - Regular rhythm and rate.  Neuro - easily arousable, awake alert, making eye contact, tracking bilaterally. Global aphasia, did attempt follow central or peripheral commands today. Able to laugh but no speech output.  No gaze preference, blinking to visual threat bilaterally. Right facial droop.  Tongue midline.  Left upper extremity 4/5, left lower extremity at least 3-/5.  Right upper extremity 2/5 with significantly increased muscle tone, and right lower extremity mild withdrawal on pain stimulation 2+/5.  No Babinski. Sensation, coordination and gait not tested. Global bradykinesia and rigidity.    ASSESSMENT/PLAN [name] is a 86 y.o. male with history of hypertension and  mild dementia (medical care at [facility]), presents to the emergency department with sudden onset  of slurred speech, aphasia, left gaze preference with right-sided neglect, right hemiparesis, facial droop and weakness. He received IV t-PA Friday 04/16/19 at 2230.  Stroke: Left ACA territory infarct, likely due to large vessel disease although cardioembolic source not excluded  Code Stroke CT Head - No acute intracranial abnormality, multiple remote lacunar infarcts involving the bilateral basal ganglia, with chronic left cerebellar infarcts.   MRI head - Moderate-sized acute ischemic nonhemorrhagic left ACA territory infarct  CTA H&N - severe distal Left ICA siphon stenosis due to bulky calcified plaque, multifocal Left MCA irregularity and stenoses, up to severe stenoses of both distal VAs, and the Left PCA. bilateral MCA M3, and Left ACA A2 severe stenoses.   EEG - cortical dysfunction in left frontotemporal region, no seizures.  2D Echo - EF 70-75%. No intracardiac source of embolism   LE venous doppler  Negative for DVT   Consider 30 day cardiac event monitoring as outpt  [employer] Virus 2 - negative  LDL - 113  HgbA1c - 5.3  UDS - negative  VTE prophylaxis - lovenox  No antithrombotic prior to admission, now on ASA 325 and plavix DAPT for 3 months and then ASA alone given significant intracranial stenosis  Palliative consult 04/23/19 - Full Code - plan OP referral to Hospice. Continue to follow as an IP, last saw 05/02/2019. Plan f/u after trial Sinemet  Therapy recommendations: CIR->SNF  Disposition:  Pending - pt without insurance coverage - SW involved  Medically ready for d/c once d/c plan determined.   Hypertension  Home BP meds: Zestril 2.5  On lisinopril to 10 -> 20 mg  On norvasc 5 -> 10 mg  BP stable 140-160s . Long-term BP goal normotensive  Hyperlipidemia  Home Lipid lowering medication: none  LDL 113, goal < 70  Current lipid lowering medication: on lipitor 40  Continue statin at discharge  Dysphagia . Secondary to stroke . D1 thin  liquids->D2 thin liquids . Speech on board . Decreased po intake -> On IV fluid @ ->increase to 75h  Mild tachycardia and tachypnea, resolved  afebrile  WBC 6.4  UA negative 2/10, UA repeat unremarkable  CXR NAD 2/10, repeat 04/30/19 - neg.  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  He continues to be difficult to place in SNF.  Antony Contras, MD  To contact Stroke Continuity provider, please refer to [email or web address] After hours, contact General Neurology

## 2019-05-06 NOTE — Progress Notes (Signed)
Occupational Therapy Treatment Patient Details Name: Scott Gilbert MRN: [id number] DOB: 01-09-1930 Today's Date: 05/06/2019    History of present illness 86 y.o. male with past medical history significant for hypertension and mild dementia. He presented to the emergency department with sudden onset of slurred speech and aphasia, facial droop and weakness.   CT cerebral perfusion on 04/16/19 reported: "acute distal Left ACA territory ischemia and infarct."   OT comments  Pt making good progress with adls. Pt with increased ability to feed and groom today. Pt using LUE as gross assist to hold food bowl while eating with LUE.  Pt demonstrates perseveration, motor apraxia and decreased initiation during task, but once hand over hand provided to initiate, pt fed self 75% of plate with only min cues for perseveration and to slow down.  Making good progress in area of feeding and new feeding goal added.    Follow Up Recommendations  CIR;Supervision/Assistance - 24 hour    Equipment Recommendations       Recommendations for Other Services      Precautions / Restrictions Precautions Precautions: Fall Restrictions Weight Bearing Restrictions: No       Mobility Bed Mobility                  Transfers                      Balance                                           ADL either performed or assessed with clinical judgement   ADL Overall ADL's : Needs assistance/impaired Eating/Feeding: Moderate assistance;Sitting;Cueing for sequencing Eating/Feeding Details (indicate cue type and reason): Pt sat up in bed to feed self today with increased independence. Pt demonstrated perseveration with attempting to get food onto the untensil.  With min gestural cues, pt could take food to mouth with cues.  Once task initiated, if food was easy to get on utensil (pudding and oatmeal)pt fed self 100% of bowl with min guard only while pt used RUE to hold bowl while  eating with LUE.  Pt with some oral apraxia with spoon in his mouth and at times attempted to drink out of oatmeal bowl. Pt easily redirected with hand over hand assist. Pt drank from 1/2 full coffee cup with min cues to take small sips by therapist putting hand over top of cup to stop drinking in between sips.   Grooming: Therapist, nutritional;Wash/dry hands;Minimal assistance;Sitting Grooming Details (indicate cue type and reason): Max hand over hand assist o start task but once task started pt was able to finish with one physical gesture to keep going in a way of a tap on his cheek.                               General ADL Comments: session focused on feeding and grooming with great improvements noted.      Vision       Perception     Praxis      Cognition Arousal/Alertness: Awake/alert Behavior During Therapy: [facility] for tasks assessed/performed                                   General  Comments: Pt continues to be nonverbal during feeding session.        Exercises     Shoulder Instructions       General Comments Pt making progress with rote/famiilar tasks such as feeding and grooming.  Pt continues to perservate and requires assist with initiation.    Pertinent Vitals/ Pain       Pain Assessment: Faces Faces Pain Scale: No hurt  Home Living                                          Prior Functioning/Environment              Frequency  Min 2X/week        Progress Toward Goals  OT Goals(current goals can now be found in the care plan section)  Progress towards OT goals: Progressing toward goals  Acute Rehab OT Goals Patient Stated Goal: unable to state OT Goal Formulation: Patient unable to participate in goal setting Time For Goal Achievement: 05/15/19 Potential to Achieve Goals: Good ADL Goals Pt Will Perform Eating: with min assist;sitting Pt Will Perform Grooming: with min assist;sitting Pt Will Perform Upper  Body Dressing: with min assist;sitting Pt Will Transfer to Toilet: with min assist;stand pivot transfer;bedside commode Pt Will Perform Toileting - Clothing Manipulation and hygiene: with min assist;sitting/lateral leans;sit to/from stand Pt/caregiver will Perform Home Exercise Program: Increased strength;Right Upper extremity;Increased ROM;With minimal assist Additional ADL Goal #1: Pt will participate in x3 mins of ADL task with moderate cueing to attend.  Plan Discharge plan remains appropriate    Co-evaluation                 AM-PAC OT "6 Clicks" Daily Activity     Outcome Measure   Help from another person eating meals?: A Lot Help from another person taking care of personal grooming?: A Little Help from another person toileting, which includes using toliet, bedpan, or urinal?: A Lot Help from another person bathing (including washing, rinsing, drying)?: A Lot Help from another person to put on and taking off regular upper body clothing?: A Lot Help from another person to put on and taking off regular lower body clothing?: A Lot 6 Click Score: 13    End of Session    OT Visit Diagnosis: Unsteadiness on feet (R26.81);Muscle weakness (generalized) (M62.81)   Activity Tolerance Patient tolerated treatment well   Patient Left in chair;with call bell/phone within reach;with chair alarm set   Nurse Communication Mobility status        Time: 2355-7322 OT Time Calculation (min): 37 min  Charges: OT General Charges $OT Visit: 1 Visit OT Treatments $Self Care/Home Management : 23-37 mins    Glenford Peers 05/06/2019, 9:02 AM

## 2019-05-06 NOTE — TOC Progression Note (Signed)
Transition of Care [facility]) - Progression Note    Patient Details  Name: Scott Gilbert MRN: [id number] Date of Birth: 1929-09-01  [facility] CM/SW Contact  Baldemar Lenis, [name] Phone Number: 05/06/2019, 4:53 PM  Clinical Narrative:   CSW received update from patient's son-in-law that they have been in communication with a lawyer about pursuing guardianship, and need a letter documenting incompetency. CSW completed letter and got approval from MD, sent to son-in-law to assist the family with guardianship over the patient's affairs. No other needs at this time. CSW to follow.    Expected Discharge Plan: IP Rehab Facility Barriers to Discharge: Continued Medical Work up, Inadequate or no insurance  Expected Discharge Plan and Services Expected Discharge Plan: IP Rehab Facility       Living arrangements for the past 2 months: Single Family Home                                       Social Determinants of Health (SDOH) Interventions    Readmission Risk Interventions No flowsheet data found.

## 2019-05-06 NOTE — Progress Notes (Signed)
STROKE TEAM PROGRESS NOTE   INTERVAL HISTORY Patient is sitting up comfortably in bed he remains globally aphasic.  He does track visually and socially smiles and follows some commands to mimicking only. Vital signs are stable.  No changes OBJECTIVE Vitals:   05/06/19 0325 05/06/19 0559 05/06/19 0800 05/06/19 1220  BP: (!) 186/77 129/61 (!) 160/69 125/66  Pulse: 87 75 82 70  Resp: 15 18 16 17   Temp: 98.6 F (37 C) 98.4 F (36.9 C) 99.1 F (37.3 C) 98 F (36.7 C)  TempSrc: Oral Oral Oral Oral  SpO2: 97% 95% 97% 95%  Weight:       CBC:  Recent Labs  Lab 05/04/19 0225 05/05/19 0156  WBC 6.4 6.3  HGB 11.9* 11.5*  HCT 35.2* 33.3*  MCV 93.6 90.7  PLT 353 322   Basic Metabolic Panel:  Recent Labs  Lab 05/04/19 0225 05/05/19 0156  NA 133* 129*  K 4.7 4.1  CL 103 100  CO2 24 21*  GLUCOSE 122* 119*  BUN 32* 25*  CREATININE 1.57* 1.29*  CALCIUM 8.4* 7.9*    IMAGING past 24h No results found.   PHYSICAL EXAM    General - Well nourished, well developed elderly caucasian male, in no apparent distress.    Ophthalmologic - fundi not visualized due to noncooperation.  Cardiovascular - Regular rhythm and rate.  Neuro - easily arousable, awake alert, making eye contact, tracking bilaterally. Global aphasia, did attempt follow central or peripheral commands today. Able to laugh but no speech output.  No gaze preference, blinking to visual threat bilaterally. Right facial droop.  Tongue midline.  Left upper extremity 4/5, left lower extremity at least 3-/5.  Right upper extremity 2/5 with significantly increased muscle tone, and right lower extremity mild withdrawal on pain stimulation 2+/5.  No Babinski. Sensation, coordination and gait not tested. Global bradykinesia and rigidity.    ASSESSMENT/PLAN Scott Gilbert is a 86 y.o. male with history of hypertension and  mild dementia (medical care at [facility]), presents to the emergency department with sudden onset of slurred  speech, aphasia, left gaze preference with right-sided neglect, right hemiparesis, facial droop and weakness. He received IV t-PA Friday 04/16/19 at 2230.  Stroke: Left ACA territory infarct, likely due to large vessel disease although cardioembolic source not excluded  Code Stroke CT Head - No acute intracranial abnormality, multiple remote lacunar infarcts involving the bilateral basal ganglia, with chronic left cerebellar infarcts.   MRI head - Moderate-sized acute ischemic nonhemorrhagic left ACA territory infarct  CTA H&N - severe distal Left ICA siphon stenosis due to bulky calcified plaque, multifocal Left MCA irregularity and stenoses, up to severe stenoses of both distal VAs, and the Left PCA. bilateral MCA M3, and Left ACA A2 severe stenoses.   EEG - cortical dysfunction in left frontotemporal region, no seizures.  2D Echo - EF 70-75%. No intracardiac source of embolism   LE venous doppler  Negative for DVT   Consider 30 day cardiac event monitoring as outpt  04/18/19 Virus 2 - negative  LDL - 113  HgbA1c - 5.3  UDS - negative  VTE prophylaxis - lovenox  No antithrombotic prior to admission, now on ASA 325 and plavix DAPT for 3 months and then ASA alone given significant intracranial stenosis  Palliative consult 04/23/19 - Full Code - plan OP referral to Hospice. Continue to follow as an IP, last saw 05/02/2019. Plan f/u after trial Sinemet  Therapy recommendations: CIR->SNF   Disposition:  Pending - pt without insurance coverage - SW involved  Medically ready for d/c once d/c plan determined.   Hypertension  Home BP meds: Zestril 2.5  On lisinopril to 10 -> 20 mg  On norvasc 5 -> 10 mg  BP stable 140-160s . Long-term BP goal normotensive  Hyperlipidemia  Home Lipid lowering medication: none  LDL 113, goal < 70  Current lipid lowering medication: on lipitor 40  Continue statin at discharge  Dysphagia . Secondary to stroke . D1 thin liquids->D2  thin liquids . Speech on board . Decreased po intake -> On IV fluid @ ->increase to 75h  Mild tachycardia and tachypnea, resolved  afebrile  WBC 6.4  UA negative 2/10, UA repeat unremarkable  CXR NAD 2/10, repeat 04/30/19 - neg.  On IV fluid @ 50->increase to 75h  Close monitoring  Other Stroke Risk Factors  Advanced age  Hx stroke/TIA - by imaging  Other Active Problems  AKI on CKD - stage 3a, Cre 1.20->1.31->1.57 - increase IVF to 75  Dementia - on galantamine and Risperdal   Fall - right knee bruise  Possible Vascular parkinsonism -- attempt trial of sinemet 2/13>>  Hospital day # 20  Patient is neurologically unchanged.  He continues to be difficult to place in SNF. Dc simemet as I do not believe he has parkinson`s. Antony Contras, MD  To contact Stroke Continuity provider, please refer to [email or web address] After hours, contact General Neurology

## 2019-05-06 NOTE — Progress Notes (Signed)
Physical Therapy Treatment Patient Details Name: Scott Gilbert MRN: [id number] DOB: 1930-02-29 Today's Date: 05/06/2019    History of Present Illness 86 y.o. male with past medical history significant for hypertension and mild dementia. He presented to the emergency department with sudden onset of slurred speech and aphasia, facial droop and weakness.   CT cerebral perfusion on 04/16/19 reported: "acute distal Left ACA territory ischemia and infarct."    PT Comments    Pt performed progression of gt training this session.  He responded well to [facility] and ace wrap into dorsiflexion.  He continues to benefit from aggressive CIR based on functional gains.  Will continue to progress mobility if efforts to decrease caregiver burden.      Follow Up Recommendations  CIR;Supervision/Assistance - 24 hour     Equipment Recommendations  Wheelchair (measurements PT)    Recommendations for Other Services       Precautions / Restrictions Precautions Precautions: Fall Precaution Comments: Utilized KI for gt training and ace wrapped foot into dorsiflexion. Required Braces or Orthoses: Knee Immobilizer - Right Restrictions Weight Bearing Restrictions: No    Mobility  Bed Mobility Overal bed mobility: Needs Assistance Bed Mobility: Supine to Sit     Supine to sit: Mod assist     General bed mobility comments: Assistance to advance LEs to edge of bed and elevate trunk into a seated position.  LOB noted posterior without unilateral support.  Transfers Overall transfer level: Needs assistance Equipment used: Rolling walker (2 wheeled) Transfers: Sit to/from Stand Sit to Stand: Mod assist         General transfer comment: Cues for hand placement and assistance to boost into standing.  Ambulation/Gait Ambulation/Gait assistance: Max assist;+2 safety/equipment(+2 for close chair follow.) Gait Distance (Feet): 20 Feet Assistive device: Rolling walker (2 wheeled) Gait Pattern/deviations:  Step-to pattern;Decreased stance time - right;Decreased dorsiflexion - right;Narrow base of support Gait velocity: decreased   General Gait Details: No buckling with R KI in place.  He continues to require total assistance to advance RLE forward with rotation of his trunk.   Stairs             Wheelchair Mobility    Modified Rankin (Stroke Patients Only)       Balance Overall balance assessment: Needs assistance Sitting-balance support: Single extremity supported;Feet supported Sitting balance-Leahy Scale: Poor Sitting balance - Comments: LOB posterior     Standing balance-Leahy Scale: Poor Standing balance comment: External assistance to maintain standing.                            Cognition Arousal/Alertness: Awake/alert Behavior During Therapy: WFL for tasks assessed/performed Overall Cognitive Status: Difficult to assess                                        Exercises      General Comments        Pertinent Vitals/Pain Pain Assessment: Faces Faces Pain Scale: No hurt    Home Living                      Prior Function            PT Goals (current goals can now be found in the care plan section) Acute Rehab PT Goals Patient Stated Goal: unable to state Potential to Achieve Goals: Fair Progress  towards PT goals: Progressing toward goals    Frequency    Min 3X/week      PT Plan Current plan remains appropriate    Co-evaluation              AM-PAC PT "6 Clicks" Mobility   Outcome Measure  Help needed turning from your back to your side while in a flat bed without using bedrails?: A Lot Help needed moving from lying on your back to sitting on the side of a flat bed without using bedrails?: A Lot Help needed moving to and from a bed to a chair (including a wheelchair)?: A Lot Help needed standing up from a chair using your arms (e.g., wheelchair or bedside chair)?: A Lot Help needed to walk in  hospital room?: Total Help needed climbing 3-5 steps with a railing? : Total 6 Click Score: 10    End of Session Equipment Utilized During Treatment: Gait belt Activity Tolerance: Patient tolerated treatment well Patient left: in chair;with call bell/phone within reach;with chair alarm set Nurse Communication: Mobility status PT Visit Diagnosis: Other abnormalities of gait and mobility (R26.89);Hemiplegia and hemiparesis;Muscle weakness (generalized) (M62.81) Hemiplegia - Right/Left: Right Hemiplegia - caused by: Cerebral infarction     Time: 1340-1405 PT Time Calculation (min) (ACUTE ONLY): 25 min  Charges:  $Gait Training: 8-22 mins                     Scott Gilbert , PTA Acute Rehabilitation Services Pager [phone] Office [phone]     Scott Gilbert Artis Delay 05/06/2019, 2:29 PM

## 2019-05-07 LAB — CBC
HCT: 34 % — ABNORMAL LOW (ref 39.0–52.0)
Hemoglobin: 11.5 g/dL — ABNORMAL LOW (ref 13.0–17.0)
MCH: 31.5 pg (ref 26.0–34.0)
MCHC: 33.8 g/dL (ref 30.0–36.0)
MCV: 93.2 fL (ref 80.0–100.0)
Platelets: 307 10*3/uL (ref 150–400)
RBC: 3.65 MIL/uL — ABNORMAL LOW (ref 4.22–5.81)
RDW: 12 % (ref 11.5–15.5)
WBC: 6.9 10*3/uL (ref 4.0–10.5)
nRBC: 0 % (ref 0.0–0.2)

## 2019-05-07 LAB — BASIC METABOLIC PANEL
Anion gap: 8 (ref 5–15)
BUN: 27 mg/dL — ABNORMAL HIGH (ref 8–23)
CO2: 24 mmol/L (ref 22–32)
Calcium: 8.4 mg/dL — ABNORMAL LOW (ref 8.9–10.3)
Chloride: 103 mmol/L (ref 98–111)
Creatinine, Ser: 1.22 mg/dL (ref 0.61–1.24)
GFR calc Af Amer: >60 mL/min (ref >60)
GFR calc non Af Amer: 53 mL/min — ABNORMAL LOW (ref >60)
Glucose, Bld: 111 mg/dL — ABNORMAL HIGH (ref 70–99)
Potassium: 4 mmol/L (ref 3.5–5.1)
Sodium: 135 mmol/L (ref 135–145)

## 2019-05-07 NOTE — Progress Notes (Signed)
I continued spiritual support for Mr Mossa. He was alert with his open eyes and emotional with tears. I was pastorally present offering words of comfort, ministry of presence, sacred text reading, and prayer. Not other communication was noticed by his part. Chaplain will follow as needed.   Chaplain Resident Orest Dikes  [phone]

## 2019-05-07 NOTE — Progress Notes (Signed)
STROKE TEAM PROGRESS NOTE   INTERVAL HISTORY Patient is sitting up comfortably in bed he remains globally aphasic.  He does track visually  nd follows some commands to mimicking only. Vital signs are stable.  No changes OBJECTIVE Vitals:   05/06/19 2336 05/07/19 0328 05/07/19 0810 05/07/19 1247  BP: (!) 168/74 (!) 139/57 (!) 143/55 118/67  Pulse: 94 75 74 73  Resp: 19 15 18 16   Temp: 97.9 F (36.6 C) 97.8 F (36.6 C) 97.8 F (36.6 C) 98.1 F (36.7 C)  TempSrc: Oral Oral Oral Oral  SpO2: 98% 100% 97% 98%  Weight:       CBC:  Recent Labs  Lab 05/05/19 0156 05/07/19 0429  WBC 6.3 6.9  HGB 11.5* 11.5*  HCT 33.3* 34.0*  MCV 90.7 93.2  PLT 322 307   Basic Metabolic Panel:  Recent Labs  Lab 05/05/19 0156 05/07/19 0429  NA 129* 135  K 4.1 4.0  CL 100 103  CO2 21* 24  GLUCOSE 119* 111*  BUN 25* 27*  CREATININE 1.29* 1.22  CALCIUM 7.9* 8.4*    IMAGING past 24h No results found.   PHYSICAL EXAM    General - Well nourished, well developed elderly caucasian male, in no apparent distress.    Ophthalmologic - fundi not visualized due to noncooperation.  Cardiovascular - Regular rhythm and rate.  Neuro - easily arousable, awake alert, making eye contact, tracking bilaterally. Global aphasia, did attempt follow central or peripheral commands today. Able to laugh but no speech output.  No gaze preference, blinking to visual threat bilaterally. Right facial droop.  Tongue midline.  Left upper extremity 4/5, left lower extremity at least 3-/5.  Right upper extremity 2/5 with significantly increased muscle tone, and right lower extremity mild withdrawal on pain stimulation 2+/5.  No Babinski. Sensation, coordination and gait not tested. Global bradykinesia and rigidity.    ASSESSMENT/PLAN [name] is a 86 y.o. male with history of hypertension and  mild dementia (medical care at [facility]), presents to the emergency department with sudden onset of slurred speech, aphasia,  left gaze preference with right-sided neglect, right hemiparesis, facial droop and weakness. He received IV t-PA Friday 04/16/19 at 2230.  Stroke: Left ACA territory infarct, likely due to large vessel disease although cardioembolic source not excluded  Code Stroke CT Head - No acute intracranial abnormality, multiple remote lacunar infarcts involving the bilateral basal ganglia, with chronic left cerebellar infarcts.   MRI head - Moderate-sized acute ischemic nonhemorrhagic left ACA territory infarct  CTA H&N - severe distal Left ICA siphon stenosis due to bulky calcified plaque, multifocal Left MCA irregularity and stenoses, up to severe stenoses of both distal VAs, and the Left PCA. bilateral MCA M3, and Left ACA A2 severe stenoses.   EEG - cortical dysfunction in left frontotemporal region, no seizures.  2D Echo - EF 70-75%. No intracardiac source of embolism   LE venous doppler  Negative for DVT   Consider 30 day cardiac event monitoring as outpt  04/18/19 Virus 2 - negative  LDL - 113  HgbA1c - 5.3  UDS - negative  VTE prophylaxis - lovenox  No antithrombotic prior to admission, now on ASA 325 and plavix DAPT for 3 months and then ASA alone given significant intracranial stenosis  Palliative consult 04/23/19 - Full Code - plan OP referral to Hospice. Continue to follow as an IP, last saw 05/02/2019. Plan f/u after trial Sinemet  Therapy recommendations: CIR->SNF   Disposition:  Pending - pt without insurance coverage - SW involved  Medically ready for d/c once d/c plan determined.   Hypertension  Home BP meds: Zestril 2.5  On lisinopril to 10 -> 20 mg  On norvasc 5 -> 10 mg  BP stable 140-160s . Long-term BP goal normotensive  Hyperlipidemia  Home Lipid lowering medication: none  LDL 113, goal < 70  Current lipid lowering medication: on lipitor 40  Continue statin at discharge  Dysphagia . Secondary to stroke . D1 thin liquids->D2 thin  liquids . Speech on board . Decreased po intake -> On IV fluid @ ->increase to 75h  Mild tachycardia and tachypnea, resolved  afebrile  WBC 6.4  UA negative 2/10, UA repeat unremarkable  CXR NAD 2/10, repeat 04/30/19 - neg.  On IV fluid @ 50->increase to 75h  Close monitoring  Other Stroke Risk Factors  Advanced age  Hx stroke/TIA - by imaging  Other Active Problems  AKI on CKD - stage 3a, Cre 1.20->1.31->1.57 - increase IVF to 75  Dementia - on galantamine and Risperdal   Fall - right knee bruise  Possible Vascular parkinsonism -- attempt trial of sinemet 2/13>>  Hospital day # 21  Patient is neurologically unchanged.  He continues to be difficult to place in SNF.  Antony Contras, MD  To contact Stroke Continuity provider, please refer to [email or web address] After hours, contact General Neurology

## 2019-05-08 NOTE — Plan of Care (Signed)
  Problem: Clinical Measurements: Goal: Will remain free from infection Outcome: Progressing Goal: Diagnostic test results will improve Outcome: Progressing Goal: Respiratory complications will improve Outcome: Progressing Goal: Cardiovascular complication will be avoided Outcome: Progressing   Problem: Safety: Goal: Ability to remain free from injury will improve Outcome: Progressing Note: Scott Gilbert has remained free of falls and injuries this shift.

## 2019-05-08 NOTE — Progress Notes (Signed)
STROKE TEAM PROGRESS NOTE   INTERVAL HISTORY Patient is sitting up comfortably in bed he remains globally aphasic.  He does track visually but not following commands. However, he seemed able to say his first name in a very dysarthric voice but not able to answer any other questions or following commands. Vital signs are stable.  OBJECTIVE Vitals:   05/07/19 2332 05/08/19 0355 05/08/19 0800 05/08/19 1218  BP: 124/65 (!) 138/54 128/69 (!) 139/58  Pulse: 72 78 84 86  Resp: 16 16 17 17   Temp: 98 F (36.7 C) 98.1 F (36.7 C) 98.4 F (36.9 C) 98.2 F (36.8 C)  TempSrc: Oral Oral Axillary   SpO2: 99% 99% 99%   Weight:       CBC:  Recent Labs  Lab 05/05/19 0156 05/07/19 0429  WBC 6.3 6.9  HGB 11.5* 11.5*  HCT 33.3* 34.0*  MCV 90.7 93.2  PLT 322 161   Basic Metabolic Panel:  Recent Labs  Lab 05/05/19 0156 05/07/19 0429  NA 129* 135  K 4.1 4.0  CL 100 103  CO2 21* 24  GLUCOSE 119* 111*  BUN 25* 27*  CREATININE 1.29* 1.22  CALCIUM 7.9* 8.4*    IMAGING past 24h No results found.   PHYSICAL EXAM    General - Well nourished, well developed elderly caucasian male, in no apparent distress.    Ophthalmologic - fundi not visualized due to noncooperation.  Cardiovascular - Regular rhythm and rate.  Neuro - easily arousable, awake alert, making eye contact, tracking bilaterally. Global aphasia, did attempt follow central or peripheral commands today. Able to laugh but no speech output.  No gaze preference, blinking to visual threat bilaterally. Right facial droop.  Tongue midline.  Left upper extremity 4/5, left lower extremity at least 3-/5.  Right upper extremity 2/5 with significantly increased muscle tone, and right lower extremity mild withdrawal on pain stimulation 2+/5.  No Babinski. Sensation, coordination and gait not tested. Global bradykinesia and rigidity.    ASSESSMENT/PLAN [name] is a 86 y.o. male with history of hypertension and  mild dementia  (medical care at [facility]), presents to the emergency department with sudden onset of slurred speech, aphasia, left gaze preference with right-sided neglect, right hemiparesis, facial droop and weakness. He received IV t-PA Friday 04/16/19 at 2230.  Stroke: Left ACA territory infarct, likely due to large vessel disease although cardioembolic source not excluded  Code Stroke CT Head - No acute intracranial abnormality, multiple remote lacunar infarcts involving the bilateral basal ganglia, with chronic left cerebellar infarcts.   MRI head - Moderate-sized acute ischemic nonhemorrhagic left ACA territory infarct  CTA H&N - severe distal Left ICA siphon stenosis due to bulky calcified plaque, multifocal Left MCA irregularity and stenoses, up to severe stenoses of both distal VAs, and the Left PCA. bilateral MCA M3, and Left ACA A2 severe stenoses.   EEG - cortical dysfunction in left frontotemporal region, no seizures.  2D Echo - EF 70-75%. No intracardiac source of embolism   LE venous doppler  Negative for DVT   Consider 30 day cardiac event monitoring as outpt vs inpt loop placement (more cost effective)  [employer] Virus 2 - negative  LDL - 113  HgbA1c - 5.3  UDS - negative  VTE prophylaxis - lovenox  No antithrombotic prior to admission, now on ASA 325 and plavix DAPT for 3 months and then ASA alone given significant intracranial stenosis  Palliative consult 04/23/19 - Full Code - plan OP  referral to Hospice. Continue to follow as an IP, last saw 05/02/2019. Plan f/u after trial Sinemet  Therapy recommendations: CIR->SNF   Disposition:  Pending - pt without insurance coverage - SW involved  Medically ready for d/c once d/c plan determined.   Hypertension  Home BP meds: Zestril 2.5  On lisinopril to 10 -> 20 mg  On norvasc 5 -> 10 mg  BP stable 120-140s . Long-term BP goal normotensive  Hyperlipidemia  Home Lipid lowering medication: none  LDL 113, goal < 70  Current  lipid lowering medication: on lipitor 40  Continue statin at discharge  Dysphagia . Secondary to stroke . D1 thin liquids->D2 thin liquids . Speech on board . On IV fluid @50   Mild tachycardia and tachypnea, resolved  afebrile  WBC 6.4  UA negative 2/10, UA repeat unremarkable  CXR NAD 2/10, repeat 04/30/19 - neg.  On IV fluid   Close monitoring  Other Stroke Risk Factors  Advanced age  Hx stroke/TIA - by imaging  Other Active Problems  AKI on CKD - stage 3a, Cre 1.20->1.31->1.57 ->1.22  Dementia - on galantamine and Risperdal   Fall - right knee bruise  Possible Vascular parkinsonism -- attempt trial of sinemet 2/13>>  Hospital day # 22  04-13-1968, MD PhD Stroke Neurology 05/08/2019 7:03 PM   To contact Stroke Continuity provider, please refer to 05/10/2019. After hours, contact General Neurology

## 2019-05-09 NOTE — Plan of Care (Signed)
  Problem: Education: Goal: Knowledge of disease or condition will improve Outcome: Progressing Goal: Knowledge of secondary prevention will improve Outcome: Progressing Goal: Knowledge of patient specific risk factors addressed and post discharge goals established will improve Outcome: Progressing Goal: Individualized Educational Video(s) Outcome: Progressing   Problem: Education: Goal: Knowledge of General Education information will improve Description: Including pain rating scale, medication(s)/side effects and non-pharmacologic comfort measures Outcome: Progressing   Problem: Health Behavior/Discharge Planning: Goal: Ability to manage health-related needs will improve Outcome: Progressing   Problem: Clinical Measurements: Goal: Ability to maintain clinical measurements within normal limits will improve Outcome: Progressing Goal: Will remain free from infection Outcome: Progressing Goal: Diagnostic test results will improve Outcome: Progressing Goal: Respiratory complications will improve Outcome: Progressing Goal: Cardiovascular complication will be avoided Outcome: Progressing   Problem: Activity: Goal: Risk for activity intolerance will decrease Outcome: Progressing   Problem: Nutrition: Goal: Adequate nutrition will be maintained Outcome: Progressing   Problem: Coping: Goal: Level of anxiety will decrease Outcome: Progressing   Problem: Elimination: Goal: Will not experience complications related to bowel motility Outcome: Progressing Goal: Will not experience complications related to urinary retention Outcome: Progressing   Problem: Pain Managment: Goal: General experience of comfort will improve Outcome: Progressing   Problem: Safety: Goal: Ability to remain free from injury will improve Outcome: Progressing   Problem: Skin Integrity: Goal: Risk for impaired skin integrity will decrease Outcome: Progressing   

## 2019-05-09 NOTE — Progress Notes (Signed)
STROKE TEAM PROGRESS NOTE   INTERVAL HISTORY Patient is sitting up comfortably in bed and drinking ensure. No acute event overnight. Vital signs are stable.  OBJECTIVE Vitals:   05/09/19 0418 05/09/19 0419 05/09/19 0738 05/09/19 1215  BP: (!) 177/71 (!) 155/70 (!) 160/91 (!) 158/77  Pulse: 86 86 84 81  Resp: 16  20 16   Temp: 98.6 F (37 C)  97.6 F (36.4 C) 98.2 F (36.8 C)  TempSrc: Oral  Oral Oral  SpO2: 95%  98% 98%  Weight:       CBC:  Recent Labs  Lab 05/05/19 0156 05/07/19 0429  WBC 6.3 6.9  HGB 11.5* 11.5*  HCT 33.3* 34.0*  MCV 90.7 93.2  PLT 322 876   Basic Metabolic Panel:  Recent Labs  Lab 05/05/19 0156 05/07/19 0429  NA 129* 135  K 4.1 4.0  CL 100 103  CO2 21* 24  GLUCOSE 119* 111*  BUN 25* 27*  CREATININE 1.29* 1.22  CALCIUM 7.9* 8.4*    IMAGING past 24h No results found.   PHYSICAL EXAM    General - Well nourished, well developed elderly caucasian male, in no apparent distress.    Ophthalmologic - fundi not visualized due to noncooperation.  Cardiovascular - Regular rhythm and rate.  Neuro - easily arousable, awake alert, making eye contact, tracking bilaterally. Global aphasia, did attempt follow central or peripheral commands today. Able to laugh but no speech output.  No gaze preference, blinking to visual threat bilaterally. Right facial droop.  Tongue midline.  Left upper extremity 4/5, left lower extremity at least 3-/5.  Right upper extremity 2/5 with significantly increased muscle tone, and right lower extremity mild withdrawal on pain stimulation 2+/5.  No Babinski. Sensation, coordination and gait not tested. Global bradykinesia and rigidity.    ASSESSMENT/PLAN [name] is a 86 y.o. male with history of hypertension and  mild dementia (medical care at [facility]), presents to the emergency department with sudden onset of slurred speech, aphasia, left gaze preference with right-sided neglect, right hemiparesis, facial droop and  weakness. He received IV t-PA Friday 04/16/19 at 2230.  Stroke: Left ACA territory infarct, likely due to large vessel disease although cardioembolic source not excluded  Code Stroke CT Head - No acute intracranial abnormality, multiple remote lacunar infarcts involving the bilateral basal ganglia, with chronic left cerebellar infarcts.   MRI head - Moderate-sized acute ischemic nonhemorrhagic left ACA territory infarct  CTA H&N - severe distal Left ICA siphon stenosis due to bulky calcified plaque, multifocal Left MCA irregularity and stenoses, up to severe stenoses of both distal VAs, and the Left PCA. bilateral MCA M3, and Left ACA A2 severe stenoses.   EEG - cortical dysfunction in left frontotemporal region, no seizures.  2D Echo - EF 70-75%. No intracardiac source of embolism   LE venous doppler  Negative for DVT   Consider 30 day cardiac event monitoring as outpt vs inpt loop placement (more cost effective)  [employer] Virus 2 - negative  LDL - 113  HgbA1c - 5.3  UDS - negative  VTE prophylaxis - lovenox  No antithrombotic prior to admission, now on ASA 325 and plavix DAPT for 3 months and then ASA alone given significant intracranial stenosis  Palliative consult 04/23/19 - Full Code - plan OP referral to Hospice. Continue to follow as an IP, last saw 05/02/2019. Plan f/u after trial Sinemet  Therapy recommendations: CIR->SNF   Disposition:  Pending - pt without insurance coverage -  SW involved  Medically ready for d/c once d/c plan determined.   Hypertension  Home BP meds: Zestril 2.5  On lisinopril to 10 -> 20 mg  On norvasc 5 -> 10 mg  BP stable 140-160 . Long-term BP goal normotensive  Hyperlipidemia  Home Lipid lowering medication: none  LDL 113, goal < 70  Current lipid lowering medication: on lipitor 40  Continue statin at discharge  Dysphagia . Secondary to stroke . D1 thin liquids->D2 thin liquids . Speech on board . On IV fluid @50   Mild  tachycardia and tachypnea, resolved  afebrile  WBC 6.4  UA negative 2/10, UA repeat unremarkable  CXR NAD 2/10, repeat 04/30/19 - neg.  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 After hours, contact General Neurology

## 2019-05-10 LAB — BASIC METABOLIC PANEL
Anion gap: 8 (ref 5–15)
BUN: 28 mg/dL — ABNORMAL HIGH (ref 8–23)
CO2: 23 mmol/L (ref 22–32)
Calcium: 8.5 mg/dL — ABNORMAL LOW (ref 8.9–10.3)
Chloride: 101 mmol/L (ref 98–111)
Creatinine, Ser: 1.34 mg/dL — ABNORMAL HIGH (ref 0.61–1.24)
GFR calc Af Amer: 55 mL/min — ABNORMAL LOW (ref >60)
GFR calc non Af Amer: 47 mL/min — ABNORMAL LOW (ref >60)
Glucose, Bld: 104 mg/dL — ABNORMAL HIGH (ref 70–99)
Potassium: 4.1 mmol/L (ref 3.5–5.1)
Sodium: 132 mmol/L — ABNORMAL LOW (ref 135–145)

## 2019-05-10 LAB — CBC
HCT: 34.5 % — ABNORMAL LOW (ref 39.0–52.0)
Hemoglobin: 11.7 g/dL — ABNORMAL LOW (ref 13.0–17.0)
MCH: 31.5 pg (ref 26.0–34.0)
MCHC: 33.9 g/dL (ref 30.0–36.0)
MCV: 93 fL (ref 80.0–100.0)
Platelets: 277 10*3/uL (ref 150–400)
RBC: 3.71 MIL/uL — ABNORMAL LOW (ref 4.22–5.81)
RDW: 11.9 % (ref 11.5–15.5)
WBC: 6.3 10*3/uL (ref 4.0–10.5)
nRBC: 0 % (ref 0.0–0.2)

## 2019-05-10 NOTE — Progress Notes (Signed)
SLP Cancellation Note  Patient Details Name: Scott Gilbert MRN: [id number] DOB: Jul 28, 1929   Cancelled treatment:       Reason Eval/Treat Not Completed: Patient at procedure or test/unavailable ST attempted treatment session. Pt unavailable due to patient care needs. ST will re-attempt as schedule allows.   Shella Spearing, M.Ed., CCC-SLP Speech Therapy Acute Rehabilitation [phone]: Acute Rehab office [phone] - pager    Shella Spearing 05/10/2019, 3:56 PM

## 2019-05-10 NOTE — Progress Notes (Signed)
Occupational Therapy Treatment Patient Details Name: Scott Gilbert MRN: [id number] DOB: 1930/03/04 Today's Date: 05/10/2019    History of present illness 86 y.o. male with past medical history significant for hypertension and mild dementia. He presented to the emergency department with sudden onset of slurred speech and aphasia, facial droop and weakness.   CT cerebral perfusion on 04/16/19 reported: "acute distal Left ACA territory ischemia and infarct."   OT comments  Patient continues to make steady progress towards goals in skilled OT session. Patient's session encompassed cotreat with PT in order to progress in functional ambulation and completing ADLs at the sink in standing. Pt continues to require mod-max A in order to incorporate RUE in ADL tasks (unable to brush teeth and comb hair with RUE but able to wash face and hair). Due to increased fatigue in RUE, LUE was incorporate to promote thoroughness and overall independence. Pt requires 1 step commands in order to comprehend, often in conjunction with tactile cues to perform. Pt continues to demonstrate functional gains in therapy, and would greatly benefit from intensive rehab at [facility]. Will continue to follow acutely.    Follow Up Recommendations  CIR;Supervision/Assistance - 24 hour    Equipment Recommendations  Other (comment)    Recommendations for Other Services      Precautions / Restrictions Precautions Precautions: Fall Precaution Comments: Utilized KI for gt training and ace wrapped foot into dorsiflexion. Required Braces or Orthoses: Knee Immobilizer - Right Restrictions Weight Bearing Restrictions: No       Mobility Bed Mobility Overal bed mobility: Needs Assistance Bed Mobility: Supine to Sit Rolling: Mod assist Sidelying to sit: Mod assist Supine to sit: Mod assist     General bed mobility comments: Assistance to advance LEs to edge of bed and elevate trunk into a seated position (mod).  LOB noted  posterior without unilateral support.  Transfers Overall transfer level: Needs assistance Equipment used: Rolling walker (2 wheeled) Transfers: Sit to/from Stand Sit to Stand: Mod assist;Min assist         General transfer comment: Cues for hand placement and assistance to boost into standing.    Balance Overall balance assessment: Needs assistance Sitting-balance support: Single extremity supported;Feet supported Sitting balance-Leahy Scale: Poor Sitting balance - Comments: LOB posterior Postural control: Right lateral lean;Posterior lean Standing balance support: Bilateral upper extremity supported;During functional activity Standing balance-Leahy Scale: Poor Standing balance comment: External assistance to maintain standing.                           ADL either performed or assessed with clinical judgement   ADL Overall ADL's : Needs assistance/impaired     Grooming: Wash/dry face;Wash/dry hands;Sitting;Oral care;Brushing hair;Moderate assistance;Maximal assistance Grooming Details (indicate cue type and reason): Max hand over hand assist o start task but once task started pt was able to complete with the exception of oral care, where pt required max A in order to complete         Upper Body Dressing : Maximal assistance Upper Body Dressing Details (indicate cue type and reason): maxA to don gown     Toilet Transfer: Maximal assistance;+2 for physical assistance;+2 for safety/equipment Toilet Transfer Details (indicate cue type and reason): simulated via functional mobility with RW and MAX A +2; pt buckling in BLEs needing assist to advance RLE during functional mobility Toileting- Clothing Manipulation and Hygiene: Maximal assistance;+2 for physical assistance;+2 for safety/equipment Toileting - Clothing Manipulation Details (indicate cue type and reason):  totalA for posterior pericare     Functional mobility during ADLs: Maximal assistance;+2 for physical  assistance;+2 for safety/equipment General ADL Comments: Pt completed grooming in sitting and standing at sink and pt continues to make progress with RUE mobility once initiated     Vision       Perception     Praxis      Cognition Arousal/Alertness: Awake/alert Behavior During Therapy: [facility] for tasks assessed/performed Overall Cognitive Status: Difficult to assess                                 General Comments: Pt continues to be nonverbal during session.        Exercises     Shoulder Instructions       General Comments      Pertinent Vitals/ Pain       Pain Assessment: No/denies pain  Home Living                                          Prior Functioning/Environment              Frequency  Min 2X/week        Progress Toward Goals  OT Goals(current goals can now be found in the care plan section)  Progress towards OT goals: Progressing toward goals  Acute Rehab OT Goals Patient Stated Goal: unable to state OT Goal Formulation: Patient unable to participate in goal setting Time For Goal Achievement: 05/15/19 Potential to Achieve Goals: Good  Plan Discharge plan remains appropriate    Co-evaluation    PT/OT/SLP Co-Evaluation/Treatment: Yes     OT goals addressed during session: ADL's and self-care;Strengthening/ROM      AM-PAC OT "6 Clicks" Daily Activity     Outcome Measure   Help from another person eating meals?: A Lot Help from another person taking care of personal grooming?: A Little Help from another person toileting, which includes using toliet, bedpan, or urinal?: A Lot Help from another person bathing (including washing, rinsing, drying)?: A Lot Help from another person to put on and taking off regular upper body clothing?: A Lot Help from another person to put on and taking off regular lower body clothing?: A Lot 6 Click Score: 13    End of Session Equipment Utilized During Treatment: Rolling  walker  OT Visit Diagnosis: Unsteadiness on feet (R26.81);Muscle weakness (generalized) (M62.81)   Activity Tolerance Patient tolerated treatment well   Patient Left in chair;with call bell/phone within reach;with chair alarm set   Nurse Communication Mobility status        Time: 1129-1202 OT Time Calculation (min): 33 min  Charges: OT General Charges $OT Visit: 1 Visit OT Treatments $Self Care/Home Management : 8-22 mins  Corinne Ports E. Kween Bacorn, COTA/L Acute Rehabilitation Services [phone] [place] 05/10/2019, 1:27 PM

## 2019-05-10 NOTE — Progress Notes (Signed)
STROKE TEAM PROGRESS NOTE   INTERVAL HISTORY Patient is sitting up comfortably in bed. No verbalization. Slight increase in Cr today. Liquid intake likely poor. Will monitor. O/w no significant change.   OBJECTIVE Vitals:   05/09/19 2331 05/10/19 0303 05/10/19 0751 05/10/19 1204  BP: 104/86 101/79 133/80 (!) 137/56  Pulse: 85 86 84 81  Resp: 16 16 14 17   Temp: 98.5 F (36.9 C) 98.3 F (36.8 C) 97.8 F (36.6 C) 98.2 F (36.8 C)  TempSrc: Oral Axillary Oral Oral  SpO2: 99% 99% 98% 98%  Weight:       CBC:  Recent Labs  Lab 05/07/19 0429 05/10/19 0435  WBC 6.9 6.3  HGB 11.5* 11.7*  HCT 34.0* 34.5*  MCV 93.2 93.0  PLT 307 810   Basic Metabolic Panel:  Recent Labs  Lab 05/07/19 0429 05/10/19 0435  NA 135 132*  K 4.0 4.1  CL 103 101  CO2 24 23  GLUCOSE 111* 104*  BUN 27* 28*  CREATININE 1.22 1.34*  CALCIUM 8.4* 8.5*    IMAGING past 24h No results found.   PHYSICAL EXAM  - no change in exam today  General - Well nourished, well developed elderly caucasian male, in no apparent distress.    Ophthalmologic - fundi not visualized due to noncooperation.  Cardiovascular - Regular rhythm and rate.  Neuro - easily arousable, awake alert, making eye contact, tracking bilaterally. Global aphasia, did attempt follow central or peripheral commands today. Able to laugh but no speech output.  No gaze preference, blinking to visual threat bilaterally. Right facial droop.  Tongue midline.  Left upper extremity 4/5, left lower extremity at least 3-/5.  Right upper extremity 2/5 with significantly increased muscle tone, and right lower extremity mild withdrawal on pain stimulation 2+/5.  No Babinski. Sensation, coordination and gait not tested. Global bradykinesia and rigidity.    ASSESSMENT/PLAN [name] is a 86 y.o. male with history of hypertension and  mild dementia (medical care at [facility]), presents to the emergency department with sudden onset of slurred speech,  aphasia, left gaze preference with right-sided neglect, right hemiparesis, facial droop and weakness. He received IV t-PA Friday 04/16/19 at 2230.  Stroke: Left ACA territory infarct, likely due to large vessel disease although cardioembolic source not excluded  Code Stroke CT Head - No acute intracranial abnormality, multiple remote lacunar infarcts involving the bilateral basal ganglia, with chronic left cerebellar infarcts.   MRI head - Moderate-sized acute ischemic nonhemorrhagic left ACA territory infarct  CTA H&N - severe distal Left ICA siphon stenosis due to bulky calcified plaque, multifocal Left MCA irregularity and stenoses, up to severe stenoses of both distal VAs, and the Left PCA. bilateral MCA M3, and Left ACA A2 severe stenoses.   EEG - cortical dysfunction in left frontotemporal region, no seizures.  2D Echo - EF 70-75%. No intracardiac source of embolism   LE venous doppler  Negative for DVT   Consider 30 day cardiac event monitoring as outpt vs inpt loop placement (more cost effective)  [employer] Virus 2 - negative  LDL - 113  HgbA1c - 5.3  UDS - negative  VTE prophylaxis - lovenox  No antithrombotic prior to admission, now on ASA 325 and plavix DAPT for 3 months and then ASA alone given significant intracranial stenosis  Palliative consult 04/23/19 - Full Code - plan OP referral to Hospice. Continue to follow as an IP, last saw 05/02/2019. Plan f/u after trial Sinemet  Therapy recommendations:  CIR->SNF   Disposition:  Pending - pt without insurance coverage - SW continues to be involved without change in status   Medically ready for d/c once d/c plan determined.   Hypertension  Home BP meds: Zestril 2.5  On lisinopril to 10 -> 20 mg  On norvasc 5 -> 10 mg  BP stable 130s . Long-term BP goal normotensive  Hyperlipidemia  Home Lipid lowering medication: none  LDL 113, goal < 70  Current lipid lowering medication: on lipitor 40  Continue statin  at discharge  Dysphagia . Secondary to stroke . D1 thin liquids->D2 thin liquids . Speech on board . Ordered IV fluid @50   Mild tachycardia and tachypnea, resolved  afebrile  WBC 6.4  UA negative 2/10, UA repeat unremarkable  CXR NAD 2/10, repeat 04/30/19 - neg.  On IV fluid   Close monitoring  Other Stroke Risk Factors  Advanced age  Hx stroke/TIA - by imaging  Other Active Problems  AKI on CKD - stage 3a, Cre 1.20->1.31->1.57 ->1.22->1.34    Dementia - on galantamine and Risperdal   Fall - right knee bruise  attempt trial of sinemet 2/13>>2/18 for possible vascular parkinsonism without improvement, so stopped  Hospital day # 24  09-17-1979, MSN, APRN, ANVP-BC, AGPCNP-BC Advanced Practice Stroke Nurse [facility] See Amion for Schedule & Pager information 05/10/2019 1:01 PM   To contact Stroke Continuity provider, please refer to 05/12/2019. After hours, contact General Neurology

## 2019-05-10 NOTE — Progress Notes (Addendum)
Physical Therapy Treatment Patient Details Name: Scott Gilbert MRN: [id number] DOB: 02/06/1930 Today's Date: 05/10/2019    History of Present Illness 86 y.o. male with past medical history significant for hypertension and mild dementia. He presented to the emergency department with sudden onset of slurred speech and aphasia, facial droop and weakness.   CT cerebral perfusion on 04/16/19 reported: "acute distal Left ACA territory ischemia and infarct."    PT Comments    Pt supine soiled in urine in bed.  PTA and COTA performed co treatment to progress functional mobility.  He continues to require use of knee immobilizer and wrapping of R ankle into dorsiflexion.  He remains mute but at times tries to voice but it remains unintelligible.  Based on his progress he remains appropriate for aggressive rehab in a post acute setting.  Plan next session for increased gt and focus on LE strengthening.    Follow Up Recommendations  CIR;Supervision/Assistance - 24 hour     Equipment Recommendations  Wheelchair (measurements PT)    Recommendations for Other Services       Precautions / Restrictions Precautions Precautions: Fall Precaution Comments: Utilized KI for gt training and ace wrapped foot into dorsiflexion. Required Braces or Orthoses: Knee Immobilizer - Right(for ambulation only does not need to have one unless doing therapy) Restrictions Weight Bearing Restrictions: No    Mobility  Bed Mobility Overal bed mobility: Needs Assistance Bed Mobility: Supine to Sit Rolling: Mod assist;+2 for physical assistance Sidelying to sit: Mod assist;+2 for physical assistance Supine to sit: Mod assist     General bed mobility comments: Assistance to advance LEs to edge of bed and elevate trunk into a seated position (mod).  LOB noted posterior without unilateral support.  Transfers Overall transfer level: Needs assistance Equipment used: Rolling walker (2 wheeled) Transfers: Sit to/from  Stand Sit to Stand: Mod assist;+2 physical assistance         General transfer comment: Cues for hand placement and assistance to boost into standing.  Ambulation/Gait Ambulation/Gait assistance: +2 physical assistance;Max assist Gait Distance (Feet): 10 Feet Assistive device: Rolling walker (2 wheeled) Gait Pattern/deviations: Step-to pattern;Decreased stance time - right;Decreased dorsiflexion - right;Narrow base of support Gait velocity: decreased   General Gait Details: No buckling with R KI in place.  He continues to require total assistance to advance RLE forward with rotation of his trunk.  Continue to wrap R ankle into dorsiflexion.   Stairs             Wheelchair Mobility    Modified Rankin (Stroke Patients Only) Modified Rankin (Stroke Patients Only) Pre-Morbid Rankin Score: No symptoms Modified Rankin: Moderately severe disability     Balance Overall balance assessment: Needs assistance Sitting-balance support: Single extremity supported;Feet supported Sitting balance-Leahy Scale: Poor Sitting balance - Comments: LOB posterior Postural control: Right lateral lean;Posterior lean Standing balance support: Bilateral upper extremity supported;During functional activity Standing balance-Leahy Scale: Poor Standing balance comment: External assistance to maintain standing.                            Cognition Arousal/Alertness: Awake/alert Behavior During Therapy: WFL for tasks assessed/performed Overall Cognitive Status: Difficult to assess                                 General Comments: Pt continues to be nonverbal during session.      Exercises  General Comments        Pertinent Vitals/Pain Pain Assessment: Faces Faces Pain Scale: No hurt    Home Living                      Prior Function            PT Goals (current goals can now be found in the care plan section) Acute Rehab PT Goals Patient  Stated Goal: unable to state PT Goal Formulation: Patient unable to participate in goal setting Potential to Achieve Goals: Fair Progress towards PT goals: Progressing toward goals    Frequency    Min 3X/week      PT Plan Current plan remains appropriate    Co-evaluation PT/OT/SLP Co-Evaluation/Treatment: Yes   PT goals addressed during session: Mobility/safety with mobility OT goals addressed during session: ADL's and self-care      AM-PAC PT "6 Clicks" Mobility   Outcome Measure  Help needed turning from your back to your side while in a flat bed without using bedrails?: A Lot Help needed moving from lying on your back to sitting on the side of a flat bed without using bedrails?: A Lot Help needed moving to and from a bed to a chair (including a wheelchair)?: A Lot Help needed standing up from a chair using your arms (e.g., wheelchair or bedside chair)?: A Lot Help needed to walk in hospital room?: Total Help needed climbing 3-5 steps with a railing? : Total 6 Click Score: 10    End of Session Equipment Utilized During Treatment: Gait belt Activity Tolerance: Patient tolerated treatment well Patient left: in chair;with call bell/phone within reach;with chair alarm set Nurse Communication: Mobility status PT Visit Diagnosis: Other abnormalities of gait and mobility (R26.89);Hemiplegia and hemiparesis;Muscle weakness (generalized) (M62.81) Hemiplegia - Right/Left: Right Hemiplegia - caused by: Cerebral infarction     Time: 1125-1202 PT Time Calculation (min) (ACUTE ONLY): 37 min  Charges:  $Therapeutic Activity: 8-22 mins                     Bonney Leitz , PTA Acute Rehabilitation Services Pager [phone] Office [phone]     Scott Gilbert Artis Delay 05/10/2019, 3:53 PM

## 2019-05-11 NOTE — Progress Notes (Signed)
STROKE TEAM PROGRESS NOTE   INTERVAL HISTORY Patient is sitting up comfortably in bed. NA feeding him lunch and he is eating well. No verbalization but tracks.  OBJECTIVE Vitals:   05/10/19 2312 05/11/19 0420 05/11/19 0826 05/11/19 1257  BP: (!) 144/67 132/80 (!) 133/55 (!) 142/54  Pulse: 88 80 89 90  Resp: 18 16 16 18   Temp: 98.1 F (36.7 C) 98.2 F (36.8 C) 98.2 F (36.8 C) 98.9 F (37.2 C)  TempSrc: Oral Oral Oral Oral  SpO2: 99% 99% 96% 95%  Weight:       CBC:  Recent Labs  Lab 05/07/19 0429 05/10/19 0435  WBC 6.9 6.3  HGB 11.5* 11.7*  HCT 34.0* 34.5*  MCV 93.2 93.0  PLT 307 528   Basic Metabolic Panel:  Recent Labs  Lab 05/07/19 0429 05/10/19 0435  NA 135 132*  K 4.0 4.1  CL 103 101  CO2 24 23  GLUCOSE 111* 104*  BUN 27* 28*  CREATININE 1.22 1.34*  CALCIUM 8.4* 8.5*    IMAGING past 24h No results found.   PHYSICAL EXAM  - no change in exam again today  General - Well nourished, well developed elderly caucasian male, in no apparent distress.    Ophthalmologic - fundi not visualized due to noncooperation.  Cardiovascular - Regular rhythm and rate.  Neuro - easily arousable, awake alert, making eye contact, tracking bilaterally. Global aphasia, did attempt follow central or peripheral commands today. Able to laugh but no speech output.  No gaze preference, blinking to visual threat bilaterally. Right facial droop.  Tongue midline.  Left upper extremity 4/5, left lower extremity at least 3-/5.  Right upper extremity 2/5 with significantly increased muscle tone, and right lower extremity mild withdrawal on pain stimulation 2+/5.  No Babinski. Sensation, coordination and gait not tested. Global bradykinesia and rigidity.    ASSESSMENT/PLAN Scott Gilbert is a 86 y.o. male with history of hypertension and  mild dementia (medical care at [facility]), presents to the emergency department with sudden onset of slurred speech, aphasia, left gaze preference with  right-sided neglect, right hemiparesis, facial droop and weakness. He received IV t-PA Friday 04/16/19 at 2230.  Stroke: Left ACA territory infarct, likely due to large vessel disease although cardioembolic source not excluded  Code Stroke CT Head - No acute intracranial abnormality, multiple remote lacunar infarcts involving the bilateral basal ganglia, with chronic left cerebellar infarcts.   MRI head - Moderate-sized acute ischemic nonhemorrhagic left ACA territory infarct  CTA H&N - severe distal Left ICA siphon stenosis due to bulky calcified plaque, multifocal Left MCA irregularity and stenoses, up to severe stenoses of both distal VAs, and the Left PCA. bilateral MCA M3, and Left ACA A2 severe stenoses.   EEG - cortical dysfunction in left frontotemporal region, no seizures.  2D Echo - EF 70-75%. No intracardiac source of embolism   LE venous doppler  Negative for DVT   Consider 30 day cardiac event monitoring as outpt vs inpt loop placement (more cost effective)  [employer] Virus 2 - negative  LDL - 113  HgbA1c - 5.3  UDS - negative  VTE prophylaxis - lovenox  No antithrombotic prior to admission, now on ASA 325 and plavix DAPT for 3 months and then ASA alone given significant intracranial stenosis  Palliative consult 04/23/19 - Full Code - plan OP referral to Hospice. Continue to follow as an IP, last saw 05/02/2019. Plan f/u after trial Sinemet  Therapy recommendations: CIR->SNF  Disposition:  Pending - pt without insurance coverage - SW continues to be involved without change in status   Medically ready for d/c once d/c plan determined.   Hypertension  Home BP meds: Zestril 2.5  On lisinopril to 10 -> 20 mg  On norvasc 5 -> 10 mg  BP stable 130-140s . BP goal normotensive  Hyperlipidemia  Home Lipid lowering medication: none  LDL 113, goal < 70  Current lipid lowering medication: on lipitor 40  Continue statin at discharge  Dysphagia . Secondary to  stroke . D1 thin liquids->D2 thin liquids . Speech on board . Po fluids encouraged to NAs feeding him; likely poor volume of liquid intake  . On IV fluid @50   Mild tachycardia and tachypnea, resolved  afebrile  WBC 6.4  UA negative 2/10, UA repeat unremarkable  CXR NAD 2/10, repeat 04/30/19 - neg.  On IV fluid   Close monitoring  Other Stroke Risk Factors  Advanced age  Hx stroke/TIA - by imaging  Other Active Problems  AKI on CKD - stage 3a, Cre 1.20->1.31->1.57 ->1.22->1.34    Dementia - on galantamine and Risperdal   Fall - right knee bruise  attempt trial of sinemet 2/13>>2/18 for possible vascular parkinsonism without improvement, so stopped  Hospital day # 25  09-17-1977, MSN, APRN, ANVP-BC, AGPCNP-BC Advanced Practice Stroke Nurse [facility] See Amion for Schedule & Pager information 05/11/2019 2:44 PM   To contact Stroke Continuity provider, please refer to 05/13/2019. After hours, contact General Neurology

## 2019-05-12 LAB — CBC
HCT: 37.3 % — ABNORMAL LOW (ref 39.0–52.0)
Hemoglobin: 12.6 g/dL — ABNORMAL LOW (ref 13.0–17.0)
MCH: 31.7 pg (ref 26.0–34.0)
MCHC: 33.8 g/dL (ref 30.0–36.0)
MCV: 93.7 fL (ref 80.0–100.0)
Platelets: 220 10*3/uL (ref 150–400)
RBC: 3.98 MIL/uL — ABNORMAL LOW (ref 4.22–5.81)
RDW: 12 % (ref 11.5–15.5)
WBC: 6.5 10*3/uL (ref 4.0–10.5)
nRBC: 0 % (ref 0.0–0.2)

## 2019-05-12 LAB — POTASSIUM: Potassium: 4.2 mmol/L (ref 3.5–5.1)

## 2019-05-12 LAB — BASIC METABOLIC PANEL
Anion gap: 10 (ref 5–15)
BUN: 28 mg/dL — ABNORMAL HIGH (ref 8–23)
CO2: 23 mmol/L (ref 22–32)
Calcium: 8.8 mg/dL — ABNORMAL LOW (ref 8.9–10.3)
Chloride: 100 mmol/L (ref 98–111)
Creatinine, Ser: 1.3 mg/dL — ABNORMAL HIGH (ref 0.61–1.24)
GFR calc Af Amer: 57 mL/min — ABNORMAL LOW (ref >60)
GFR calc non Af Amer: 49 mL/min — ABNORMAL LOW (ref >60)
Glucose, Bld: 104 mg/dL — ABNORMAL HIGH (ref 70–99)
Potassium: 5.2 mmol/L — ABNORMAL HIGH (ref 3.5–5.1)
Sodium: 133 mmol/L — ABNORMAL LOW (ref 135–145)

## 2019-05-12 NOTE — Progress Notes (Signed)
Physical Therapy Treatment Patient Details Name: Scott Gilbert MRN: [id number] DOB: [birth year] Today's Date: 05/12/2019    History of Present Illness 86 y.o. male with past medical history significant for hypertension and mild dementia. He presented to the emergency department with sudden onset of slurred speech and aphasia, facial droop and weakness.   CT cerebral perfusion on 04/16/19 reported: "acute distal Left ACA territory ischemia and infarct."    PT Comments    Patient received alert, in bed, attempting to verbalize. Noted to have significantly increased tone on right side.  Patient requires mod/max assist +2 for bed mobility with mod cues.Requires close supervision to min assist to maintain sitting balance once positioned. Patient requires +2 mod assist for sit to stand transfer. Performed mobility with right KI donned. Patient continued to have right knee buckling this visit despite KI. Requires mod to max assist +2 for transfer to recliner. Patient had loose BM during transfer requiring mod +2 assist to stand up from recliner to be cleaned. Patient will continue to benefit from skilled PT while here to improve functional mobility and independence.       Follow Up Recommendations  CIR;Supervision/Assistance - 24 hour     Equipment Recommendations  Wheelchair (measurements PT)    Recommendations for Other Services Rehab consult     Precautions / Restrictions Precautions Precautions: Fall Precaution Comments: Utilized KI for gt training and ace wrapped foot into dorsiflexion. Patient still had knee buckling with KI donned Required Braces or Orthoses: Knee Immobilizer - Right Restrictions Weight Bearing Restrictions: No    Mobility  Bed Mobility Overal bed mobility: Needs Assistance Bed Mobility: Supine to Sit Rolling: Mod assist;+2 for physical assistance Sidelying to sit: Mod assist;+2 for physical assistance Supine to sit: Mod assist;+2 for physical assistance      General bed mobility comments: posterior lean in sitting  Transfers Overall transfer level: Needs assistance Equipment used: 2 person hand held assist Transfers: Sit to/from Stand Sit to Stand: +2 physical assistance;Mod assist Stand pivot transfers: Mod assist;+2 physical assistance;Max assist Squat pivot transfers: Max assist;+2 physical assistance     General transfer comment: Cues to reach for arm of chair to assist with pivoting. Patient having loose BM during transfer this morning.  Ambulation/Gait                 Stairs             Wheelchair Mobility    Modified Rankin (Stroke Patients Only) Modified Rankin (Stroke Patients Only) Pre-Morbid Rankin Score: No symptoms Modified Rankin: Moderately severe disability     Balance Overall balance assessment: Needs assistance Sitting-balance support: Feet supported;Single extremity supported Sitting balance-Leahy Scale: Poor Sitting balance - Comments: LOB posterior Postural control: Right lateral lean;Posterior lean Standing balance support: Bilateral upper extremity supported;During functional activity Standing balance-Leahy Scale: Poor Standing balance comment: External assistance to maintain standing.                            Cognition Arousal/Alertness: Awake/alert Behavior During Therapy: WFL for tasks assessed/performed Overall Cognitive Status: Difficult to assess                                 General Comments: Pt continues to be nonverbal during session.      Exercises Total Joint Exercises Ankle Circles/Pumps: PROM;Right;10 reps Heel Slides: PROM;5 reps;Right    General Comments  Pertinent Vitals/Pain Pain Assessment: No/denies pain    Home Living Family/patient expects to be discharged to:: Skilled nursing facility                    Prior Function            PT Goals (current goals can now be found in the care plan section) Acute  Rehab PT Goals Patient Stated Goal: unable to state PT Goal Formulation: Patient unable to participate in goal setting Time For Goal Achievement: 05/17/19 Potential to Achieve Goals: Fair Progress towards PT goals: Progressing toward goals    Frequency    Min 3X/week      PT Plan Current plan remains appropriate    Co-evaluation              AM-PAC PT "6 Clicks" Mobility   Outcome Measure  Help needed turning from your back to your side while in a flat bed without using bedrails?: A Lot Help needed moving from lying on your back to sitting on the side of a flat bed without using bedrails?: A Lot Help needed moving to and from a bed to a chair (including a wheelchair)?: A Lot Help needed standing up from a chair using your arms (e.g., wheelchair or bedside chair)?: A Lot Help needed to walk in hospital room?: Total Help needed climbing 3-5 steps with a railing? : Total 6 Click Score: 10    End of Session Equipment Utilized During Treatment: Gait belt;Right knee immobilizer Activity Tolerance: Patient tolerated treatment well Patient left: in chair;with call bell/phone within reach;with chair alarm set Nurse Communication: Mobility status PT Visit Diagnosis: Other abnormalities of gait and mobility (R26.89);Hemiplegia and hemiparesis;Muscle weakness (generalized) (M62.81);Difficulty in walking, not elsewhere classified (R26.2);Unsteadiness on feet (R26.81) Hemiplegia - Right/Left: Right Hemiplegia - caused by: Cerebral infarction     Time: 1610-9604 PT Time Calculation (min) (ACUTE ONLY): 34 min  Charges:  $Therapeutic Activity: 23-37 mins                     Daisa Stennis, PT, GCS 05/12/19,12:50 PM

## 2019-05-12 NOTE — Progress Notes (Signed)
  Speech Language Pathology Treatment: Cognitive-Linquistic  Patient Details Name: Scott Gilbert MRN: [id number] DOB: 1929-07-04 Today's Date: 05/12/2019 Time: 1050-1107 SLP Time Calculation (min) (ACUTE ONLY): 17 min  Assessment / Plan / Recommendation Clinical Impression  Pt seen today for skilled ST targeting aphasia. Patient with significantly improved expressive language skills. ST utilized melodic intonation therapy, verbal cues, visual aids to target language output. Pt with automatic speech: he verbalized "hello, how are you" when ST greeted him. Pt singing "Happy birthday to you" in MIT task. In verbal repetition task, patient verbalized "hello, goodbye, bed, window, forty, elbow, table, yeah." Patient stated "[name]" when asked to state his name. He benefits from visual cues for verbal output. Patient able to follow one step verbal commands in 5/8 opportunities. Patient able to verbalize "yeah" in repetition task, but ST unable to obtain consistently accurate yes/no response. ST to follow as per POC.   HPI HPI: Scott Gilbert is a 86 y.o. male with past medical history significant for hypertension, mild dementia presented to the emergency department with sudden onset of slurred speech and aphasia, facial droop and weakness.   CT cerebral perfusion on 04/16/19 reported: "acute distal Left ACA territory ischemia and infarct."       SLP Plan  Continue with current plan of care       Recommendations    CIR                SLP Visit Diagnosis: Aphasia (R47.01) Plan: Continue with current plan of care       GO                Scott Gilbert 05/12/2019, 11:10 AM  Shella Spearing, M.Ed., CCC-SLP Speech Therapy Acute Rehabilitation [phone]: Acute Rehab office [phone] - pager

## 2019-05-12 NOTE — Progress Notes (Signed)
STROKE TEAM PROGRESS NOTE   INTERVAL HISTORY Lying in bed. Tracks with eyes. No change in condition.   OBJECTIVE Vitals:   05/12/19 0000 05/12/19 0402 05/12/19 0839 05/12/19 1205  BP: (!) 156/70 (!) 162/66 (!) (P) 162/95 (!) (P) 142/60  Pulse: 82 87 (P) 87 (P) 89  Resp: 17 18 (P) 18 (P) 18  Temp: 97.9 F (36.6 C) 98 F (36.7 C) (P) 97.6 F (36.4 C) (P) 99.3 F (37.4 C)  TempSrc: Oral Oral (P) Oral (P) Oral  SpO2:    (P) 99%  Weight:       CBC:  Recent Labs  Lab 05/10/19 0435 05/12/19 0539  WBC 6.3 6.5  HGB 11.7* 12.6*  HCT 34.5* 37.3*  MCV 93.0 93.7  PLT 277 527   Basic Metabolic Panel:  Recent Labs  Lab 05/10/19 0435 05/12/19 0539  NA 132* 133*  K 4.1 5.2*  CL 101 100  CO2 23 23  GLUCOSE 104* 104*  BUN 28* 28*  CREATININE 1.34* 1.30*  CALCIUM 8.5* 8.8*    IMAGING past 24h No results found.   PHYSICAL EXAM     General - Well nourished, well developed elderly caucasian male, in no apparent distress.    Ophthalmologic - fundi not visualized due to noncooperation.  Cardiovascular - Regular rhythm and rate.  Neuro - easily arousable, awake alert, making eye contact, tracking bilaterally. Global aphasia, attempts to follow central or peripheral commands today. Able to laugh but no speech output.  No gaze preference, blinking to visual threat bilaterally. Right facial droop.  Tongue midline.  Left upper extremity 4/5, left lower extremity at least 3-/5.  Right upper extremity 3/5 with significantly increased muscle tone, and right lower extremity mild withdrawal on pain stimulation 2+/5.  No Babinski. Sensation, coordination and gait not tested. Global bradykinesia and rigidity.    ASSESSMENT/PLAN [name] is a 86 y.o. male with history of hypertension and  mild dementia (medical care at [facility]), presents to the emergency department with sudden onset of slurred speech, aphasia, left gaze preference with right-sided neglect, right hemiparesis, facial  droop and weakness. He received IV t-PA Friday 04/16/19 at 2230.  Stroke: Left ACA territory infarct, likely due to large vessel disease although cardioembolic source not excluded  Code Stroke CT Head - No acute intracranial abnormality, multiple remote lacunar infarcts involving the bilateral basal ganglia, with chronic left cerebellar infarcts.   MRI head - Moderate-sized acute ischemic nonhemorrhagic left ACA territory infarct  CTA H&N - severe distal Left ICA siphon stenosis due to bulky calcified plaque, multifocal Left MCA irregularity and stenoses, up to severe stenoses of both distal VAs, and the Left PCA. bilateral MCA M3, and Left ACA A2 severe stenoses.   EEG - cortical dysfunction in left frontotemporal region, no seizures.  2D Echo - EF 70-75%. No intracardiac source of embolism   LE venous doppler  Negative for DVT   Consider 30 day cardiac event monitoring as outpt vs inpt loop placement (more cost effective)  [employer] Virus 2 - negative  LDL - 113  HgbA1c - 5.3  UDS - negative  VTE prophylaxis - lovenox  No antithrombotic prior to admission, now on ASA 325 and plavix DAPT for 3 months and then ASA alone given significant intracranial stenosis  Palliative consult 04/23/19 - Full Code - plan OP referral to Hospice. Continue to follow as an IP, last saw 05/02/2019. Plan f/u after trial Sinemet  Therapy recommendations: CIR->SNF   Disposition:  Pending - pt without insurance coverage - SW continues to be involved without change in status   Medically ready for d/c once d/c plan determined.   Hypertension  Home BP meds: Zestril 2.5  On lisinopril to 10 -> 20 mg  On norvasc 5 -> 10 mg  BP stable 130-140s . BP goal normotensive  Hyperlipidemia  Home Lipid lowering medication: none  LDL 113, goal < 70  Current lipid lowering medication: on lipitor 40  Continue statin at discharge  Dysphagia . Secondary to stroke . D1 thin liquids->D2 thin  liquids . Speech on board . Po fluids encouraged to NAs feeding him; likely poor volume of liquid intake  . On IV fluid @50   Mild tachycardia and tachypnea, resolved  afebrile  WBC 6.4  UA negative 2/10, UA repeat unremarkable  CXR NAD 2/10, repeat 04/30/19 - neg.  On IV fluid   Close monitoring  Other Stroke Risk Factors  Advanced age  Hx stroke/TIA - by imaging  Other Active Problems  AKI on CKD - stage 3a, Cre 1.20->1.31->1.57 ->1.22->1.34->1.30    Dementia - on galantamine and Risperdal   Fall - right knee bruise  attempt trial of sinemet 2/13>>2/18 for possible vascular parkinsonism without improvement, so stopped  Hyperkalemia 5.2. likely hemolyzed. Recheck 1500 today pending    Hospital day # 26  09-17-1977, MSN, APRN, ANVP-BC, AGPCNP-BC Advanced Practice Stroke Nurse [facility] See Amion for Schedule & Pager information 05/12/2019 3:12 PM    To contact Stroke Continuity provider, please refer to 05/14/2019. After hours, contact General Neurology

## 2019-05-13 DIAGNOSIS — Z7189 Other specified counseling: Secondary | ICD-10-CM

## 2019-05-13 NOTE — Plan of Care (Signed)
Pt rested well during the night with no changes noted. Does get restless at times and pulls at lines, but easily redirected. Fall/safety precautions in place.  Problem: Education: Goal: Knowledge of disease or condition will improve Outcome: Progressing Goal: Knowledge of secondary prevention will improve Outcome: Progressing Goal: Knowledge of patient specific risk factors addressed and post discharge goals established will improve Outcome: Progressing Goal: Individualized Educational Video(s) Outcome: Progressing   Problem: Education: Goal: Knowledge of General Education information will improve Description: Including pain rating scale, medication(s)/side effects and non-pharmacologic comfort measures Outcome: Progressing   Problem: Health Behavior/Discharge Planning: Goal: Ability to manage health-related needs will improve Outcome: Progressing   Problem: Clinical Measurements: Goal: Ability to maintain clinical measurements within normal limits will improve Outcome: Progressing Goal: Will remain free from infection Outcome: Progressing Goal: Diagnostic test results will improve Outcome: Progressing Goal: Respiratory complications will improve Outcome: Progressing Goal: Cardiovascular complication will be avoided Outcome: Progressing   Problem: Activity: Goal: Risk for activity intolerance will decrease Outcome: Progressing   Problem: Nutrition: Goal: Adequate nutrition will be maintained Outcome: Progressing   Problem: Coping: Goal: Level of anxiety will decrease Outcome: Progressing   Problem: Elimination: Goal: Will not experience complications related to bowel motility Outcome: Progressing Goal: Will not experience complications related to urinary retention Outcome: Progressing   Problem: Pain Managment: Goal: General experience of comfort will improve Outcome: Progressing   Problem: Safety: Goal: Ability to remain free from injury will improve Outcome:  Progressing   Problem: Skin Integrity: Goal: Risk for impaired skin integrity will decrease Outcome: Progressing

## 2019-05-13 NOTE — Plan of Care (Signed)
  Problem: Education: Goal: Knowledge of disease or condition will improve Outcome: Progressing Goal: Knowledge of secondary prevention will improve Outcome: Progressing Goal: Knowledge of patient specific risk factors addressed and post discharge goals established will improve Outcome: Progressing Goal: Individualized Educational Video(s) Outcome: Progressing   Problem: Education: Goal: Knowledge of General Education information will improve Description: Including pain rating scale, medication(s)/side effects and non-pharmacologic comfort measures Outcome: Progressing   Problem: Health Behavior/Discharge Planning: Goal: Ability to manage health-related needs will improve Outcome: Progressing   Problem: Clinical Measurements: Goal: Ability to maintain clinical measurements within normal limits will improve Outcome: Progressing Goal: Will remain free from infection Outcome: Progressing Goal: Diagnostic test results will improve Outcome: Progressing Goal: Respiratory complications will improve Outcome: Progressing Goal: Cardiovascular complication will be avoided Outcome: Progressing   Problem: Activity: Goal: Risk for activity intolerance will decrease Outcome: Progressing   Problem: Nutrition: Goal: Adequate nutrition will be maintained Outcome: Progressing   Problem: Coping: Goal: Level of anxiety will decrease Outcome: Progressing   Problem: Elimination: Goal: Will not experience complications related to bowel motility Outcome: Progressing Goal: Will not experience complications related to urinary retention Outcome: Progressing   Problem: Pain Managment: Goal: General experience of comfort will improve Outcome: Progressing   Problem: Safety: Goal: Ability to remain free from injury will improve Outcome: Progressing   Problem: Skin Integrity: Goal: Risk for impaired skin integrity will decrease Outcome: Progressing   

## 2019-05-13 NOTE — Progress Notes (Signed)
Palliative Care team following for needs and progression. Patient has made significant improvements in his ability to vocalize and is working with therapy. Our team has attempted to contact patient's family on multiple occasions to provide updates and support. He remains difficult to place and has significant barriers to discharge including lack of primary health insurance including medicare and his family support is limited. Will continue to follow him intermittently and as need based on his condition.   Anderson Malta, DO Palliative Medicine

## 2019-05-13 NOTE — Progress Notes (Signed)
Physical Therapy Treatment Patient Details Name: Scott Gilbert MRN: [id number] DOB: 1930-01-22 Today's Date: 05/13/2019    History of Present Illness 86 y.o. male with past medical history significant for hypertension and mild dementia. He presented to the emergency department with sudden onset of slurred speech and aphasia, facial droop and weakness.   CT cerebral perfusion on 04/16/19 reported: "acute distal Left ACA territory ischemia and infarct."    PT Comments    Pt soiled in stool on arrival and required rolling in bed to clean patient of stool.Pt performed gt training and functional mobility during session with continued need for external assistance.  He is still ambulating with manual facilitation and KI in place.  Based on his progress he continues to benefit from rehab in a post acute setting.  Plan next session for progression of gt training.     Follow Up Recommendations  CIR;Supervision/Assistance - 24 hour     Equipment Recommendations  Wheelchair (measurements PT)    Recommendations for Other Services       Precautions / Restrictions Precautions Precautions: Fall Precaution Comments: Utilized KI for gt training and ace wrapped foot into dorsiflexion. Required Braces or Orthoses: Knee Immobilizer - Right Knee Immobilizer - Right: On when out of bed or walking Restrictions Weight Bearing Restrictions: No    Mobility  Bed Mobility Overal bed mobility: Needs Assistance Bed Mobility: Supine to Sit Rolling: Mod assist;+2 for physical assistance         General bed mobility comments: Performed rolling to R and L sides to clean patient of bowel incontinence.  He required moderate assistance to advance LEs to edge of bed and elevate trunk into a seated position.  Transfers Overall transfer level: Needs assistance Equipment used: Rolling walker (2 wheeled) Transfers: Sit to/from Stand Sit to Stand: Mod assist;+2 physical assistance         General transfer  comment: Pt required hand over hand assistance to place R hand on RW.  He required assistance to elevate into standing.  R knee buckling in standing and required tightening of brace.  Ambulation/Gait Ambulation/Gait assistance: Max assist;+2 safety/equipment(for close chair follow.) Gait Distance (Feet): 10 Feet(+ 12 ft.) Assistive device: Rolling walker (2 wheeled) Gait Pattern/deviations: Step-to pattern;Decreased stance time - right;Decreased dorsiflexion - right;Narrow base of support Gait velocity: decreased   General Gait Details: Buckling initially in brace, required tightening to reduce knee flexion in stance phase.  Pt continues to require manual assistance to advance RLE forward.  At times he in impulsive and steps with LLE and drags R forward.  Required cues for maintaining sequencing.   Stairs             Wheelchair Mobility    Modified Rankin (Stroke Patients Only) Modified Rankin (Stroke Patients Only) Pre-Morbid Rankin Score: No symptoms Modified Rankin: Moderately severe disability     Balance Overall balance assessment: Needs assistance Sitting-balance support: Feet supported;Single extremity supported Sitting balance-Leahy Scale: Poor Sitting balance - Comments: LOB posterior Postural control: Right lateral lean;Posterior lean Standing balance support: Bilateral upper extremity supported;During functional activity Standing balance-Leahy Scale: Poor Standing balance comment: External assistance to maintain standing.                            Cognition Arousal/Alertness: Awake/alert Behavior During Therapy: WFL for tasks assessed/performed Overall Cognitive Status: Difficult to assess  General Comments: Pt continues to be nonverbal during session.      Exercises      General Comments        Pertinent Vitals/Pain Pain Assessment: No/denies pain Faces Pain Scale: No hurt    Home Living                       Prior Function            PT Goals (current goals can now be found in the care plan section) Acute Rehab PT Goals Patient Stated Goal: unable to state Potential to Achieve Goals: Fair Progress towards PT goals: Progressing toward goals    Frequency    Min 3X/week      PT Plan Current plan remains appropriate    Co-evaluation              AM-PAC PT "6 Clicks" Mobility   Outcome Measure  Help needed turning from your back to your side while in a flat bed without using bedrails?: A Lot Help needed moving from lying on your back to sitting on the side of a flat bed without using bedrails?: A Lot Help needed moving to and from a bed to a chair (including a wheelchair)?: A Lot Help needed standing up from a chair using your arms (e.g., wheelchair or bedside chair)?: A Lot Help needed to walk in hospital room?: Total Help needed climbing 3-5 steps with a railing? : Total 6 Click Score: 10    End of Session Equipment Utilized During Treatment: Gait belt;Right knee immobilizer Activity Tolerance: Patient tolerated treatment well Patient left: in chair;with call bell/phone within reach;with chair alarm set Nurse Communication: Mobility status PT Visit Diagnosis: Other abnormalities of gait and mobility (R26.89);Hemiplegia and hemiparesis;Muscle weakness (generalized) (M62.81);Difficulty in walking, not elsewhere classified (R26.2);Unsteadiness on feet (R26.81) Hemiplegia - Right/Left: Right Hemiplegia - caused by: Cerebral infarction     Time: 4970-2637 PT Time Calculation (min) (ACUTE ONLY): 33 min  Charges:  $Gait Training: 8-22 mins $Therapeutic Activity: 8-22 mins                     Bonney Leitz , PTA Acute Rehabilitation Services Pager [phone] Office [phone]     Syna Gad Artis Delay 05/13/2019, 5:05 PM

## 2019-05-13 NOTE — Progress Notes (Signed)
STROKE TEAM PROGRESS NOTE   INTERVAL HISTORY Patient just back in the bed after being up in the recliner over 1 hr.  RN reports he does well with drinking; she will try to increase what he drinks outside of meal time.   OBJECTIVE Vitals:   05/13/19 0329 05/13/19 0735 05/13/19 1124 05/13/19 1607  BP: (!) 147/55 (!) 115/55 (!) 135/55 (!) 155/68  Pulse: 88 65 76 98  Resp: 18 18 18 18   Temp: 98.5 F (36.9 C) 97.7 F (36.5 C) 98.6 F (37 C) 99.4 F (37.4 C)  TempSrc: Oral Oral Oral Oral  SpO2: 100% 100% 97% 95%  Weight:       CBC:  Recent Labs  Lab 05/10/19 0435 05/12/19 0539  WBC 6.3 6.5  HGB 11.7* 12.6*  HCT 34.5* 37.3*  MCV 93.0 93.7  PLT 277 379   Basic Metabolic Panel:  Recent Labs  Lab 05/10/19 0435 05/10/19 0435 05/12/19 0539 05/12/19 1450  NA 132*  --  133*  --   K 4.1   < > 5.2* 4.2  CL 101  --  100  --   CO2 23  --  23  --   GLUCOSE 104*  --  104*  --   BUN 28*  --  28*  --   CREATININE 1.34*  --  1.30*  --   CALCIUM 8.5*  --  8.8*  --    < > = values in this interval not displayed.    IMAGING past 24h No results found.   PHYSICAL EXAM      General - Well nourished, well developed elderly caucasian male, in no apparent distress.    Ophthalmologic - fundi not visualized due to noncooperation.  Cardiovascular - Regular rhythm and rate.  Neuro - easily arousable, awake alert, making eye contact, tracking bilaterally. Global aphasia, attempts to follow central or peripheral commands today. Able to laugh, able to clearly state his name, birthday, year following a laugh when questioned but unable to understand "place". No gaze preference, blinking to visual threat bilaterally. Right facial droop.  Tongue midline.  Left upper extremity 4/5, left lower extremity at least 3-/5.  Right upper extremity 3/5 with significantly increased muscle tone, and right lower extremity mild withdrawal on pain stimulation 2+/5.  No Babinski. Sensation, coordination and gait  not tested. Global bradykinesia and rigidity.    ASSESSMENT/PLAN Scott Gilbert is a 86 y.o. male with history of hypertension and  mild dementia (medical care at [facility] presents to the emergency department with sudden onset of slurred speech, aphasia, left gaze preference with right-sided neglect, right hemiparesis, facial droop and weakness. He received IV t-PA Friday 04/16/19 at 2230.  Stroke: Left ACA territory infarct, likely due to large vessel disease although cardioembolic source not excluded  Code Stroke CT Head - No acute intracranial abnormality, multiple remote lacunar infarcts involving the bilateral basal ganglia, with chronic left cerebellar infarcts.   MRI head - Moderate-sized acute ischemic nonhemorrhagic left ACA territory infarct  CTA H&N - severe distal Left ICA siphon stenosis due to bulky calcified plaque, multifocal Left MCA irregularity and stenoses, up to severe stenoses of both distal VAs, and the Left PCA. bilateral MCA M3, and Left ACA A2 severe stenoses.   EEG - cortical dysfunction in left frontotemporal region, no seizures.  2D Echo - EF 70-75%. No intracardiac source of embolism   LE venous doppler  Negative for DVT   Consider 30 day cardiac event monitoring as  outpt vs inpt loop placement (more cost effective)  [employer] Virus 2 - negative  LDL - 113  HgbA1c - 5.3  UDS - negative  VTE prophylaxis - lovenox  No antithrombotic prior to admission, now on ASA 325 and plavix DAPT for 3 months and then ASA alone given significant intracranial stenosis  Palliative consult 04/23/19 - Full Code - plan OP referral to Hospice. Continue to follow pt as an IP, routine attempts to contact family.  Therapy recommendations: CIR->SNF   Disposition:  Pending - pt without insurance coverage - SW continues to be involved without change in status   Medically ready for d/c once d/c plan determined.   Hypertension  Home BP meds: Zestril 2.5  On lisinopril  to 10 -> 20 mg  On norvasc 5 -> 10 mg  BP stable 130-140s . BP goal normotensive  Hyperlipidemia  Home Lipid lowering medication: none  LDL 113, goal < 70  Current lipid lowering medication: on lipitor 40  Continue statin at discharge  Dysphagia . Secondary to stroke . D1 thin liquids->D2 thin liquids . Speech on board . Po fluids encouraged to NAs feeding him; likely poor volume of liquid intake  . On IV fluid @50   Mild tachycardia and tachypnea, resolved  afebrile  WBC 6.4  UA negative 2/10, UA repeat unremarkable  CXR NAD 2/10, repeat 04/30/19 - neg.  On IV fluid   Close monitoring  Other Stroke Risk Factors  Advanced age  Hx stroke/TIA - by imaging  Other Active Problems  AKI on CKD - stage 3a, Cre 1.30    Dementia - on galantamine and Risperdal   Fall - right knee bruise  attempt trial of sinemet 2/13>>2/18 for possible vascular parkinsonism without improvement, so stopped  Hyperkalemia 5.2. likely hemolyzed. Recheck 1500 2/24 was 4.2  Hospital day # [address]  [address], MSN, APRN, ANVP-BC, AGPCNP-BC Advanced Practice Stroke Nurse [facility] See Amion for Schedule & Pager information 05/13/2019 4:36 PM    To contact Stroke Continuity provider, please refer to 05/15/2019. After hours, contact General Neurology

## 2019-05-14 LAB — CBC
HCT: 36.5 % — ABNORMAL LOW (ref 39.0–52.0)
Hemoglobin: 12.5 g/dL — ABNORMAL LOW (ref 13.0–17.0)
MCH: 31.7 pg (ref 26.0–34.0)
MCHC: 34.2 g/dL (ref 30.0–36.0)
MCV: 92.6 fL (ref 80.0–100.0)
Platelets: 240 10*3/uL (ref 150–400)
RBC: 3.94 MIL/uL — ABNORMAL LOW (ref 4.22–5.81)
RDW: 12.1 % (ref 11.5–15.5)
WBC: 8.8 10*3/uL (ref 4.0–10.5)
nRBC: 0 % (ref 0.0–0.2)

## 2019-05-14 LAB — BASIC METABOLIC PANEL
Anion gap: 11 (ref 5–15)
BUN: 24 mg/dL — ABNORMAL HIGH (ref 8–23)
CO2: 24 mmol/L (ref 22–32)
Calcium: 8.9 mg/dL (ref 8.9–10.3)
Chloride: 98 mmol/L (ref 98–111)
Creatinine, Ser: 1.24 mg/dL (ref 0.61–1.24)
GFR calc Af Amer: >60 mL/min (ref >60)
GFR calc non Af Amer: 52 mL/min — ABNORMAL LOW (ref >60)
Glucose, Bld: 102 mg/dL — ABNORMAL HIGH (ref 70–99)
Potassium: 4.2 mmol/L (ref 3.5–5.1)
Sodium: 133 mmol/L — ABNORMAL LOW (ref 135–145)

## 2019-05-14 NOTE — Progress Notes (Signed)
Occupational Therapy Treatment Patient Details Name: Scott Gilbert MRN: [id number] DOB: 1929/11/06 Today's Date: 05/14/2019    History of present illness 86 y.o. male with past medical history significant for hypertension and mild dementia. He presented to the emergency department with sudden onset of slurred speech and aphasia, facial droop and weakness.   CT cerebral perfusion on 04/16/19 reported: "acute distal Left ACA territory ischemia and infarct."   OT comments  Patient continues to make steady progress towards goals in skilled OT session. Patient's session encompassed peri care at bed level, functional transfers and improving balance on EOB in order to promote trunk control and activity tolerance. Pt continues to require increased time to heed to one or multi-step commands, however is participatory. In session to date, pt unable to hold trunk upright without external support for longer than 15 seconds (3 trials attempted). Simulated toilet transfer with use of recliner, where pt completed a stand pivot with therapist. Pt currently max +1 or mod +2 in order to transfer. Therapist attempted simple ROM with RUE, however patient would often pinch hand closed or grab therapist's fingers instead of range. Pt would continue to benefit from skilled therapy services; will follow acutely.     Follow Up Recommendations  CIR;Supervision/Assistance - 24 hour    Equipment Recommendations  Other (comment)(Defer to next venue of care)    Recommendations for Other Services      Precautions / Restrictions Precautions Precautions: Fall Precaution Comments: Utilized KI for gt training and ace wrapped foot into dorsiflexion. Required Braces or Orthoses: Knee Immobilizer - Right Knee Immobilizer - Right: On when out of bed or walking Restrictions Weight Bearing Restrictions: No       Mobility Bed Mobility Overal bed mobility: Needs Assistance Bed Mobility: Supine to Sit;Rolling Rolling: Mod  assist;+2 for physical assistance   Supine to sit: Mod assist;+2 for physical assistance     General bed mobility comments: Performed rolling to R and L sides to clean patient of bowel incontinence.  He required moderate assistance to advance LEs to edge of bed and elevate trunk into a seated position.  Transfers                      Balance Overall balance assessment: Needs assistance Sitting-balance support: Feet supported;Single extremity supported Sitting balance-Leahy Scale: Poor Sitting balance - Comments: LOB posterior Postural control: Right lateral lean;Posterior lean Standing balance support: Bilateral upper extremity supported;During functional activity Standing balance-Leahy Scale: Poor Standing balance comment: External assistance to maintain standing.                           ADL either performed or assessed with clinical judgement   ADL Overall ADL's : Needs assistance/impaired                             Toileting- Clothing Manipulation and Hygiene: +2 for physical assistance;Bed level Toileting - Clothing Manipulation Details (indicate cue type and reason): totalA for posterior pericare     Functional mobility during ADLs: Maximal assistance;+2 for physical assistance;+2 for safety/equipment General ADL Comments: Completed peri care at bed level and then transferred to chair due to soiled bed with RN     Vision       Perception     Praxis      Cognition Arousal/Alertness: Awake/alert Behavior During Therapy: [facility] for tasks assessed/performed Overall Cognitive Status: Difficult to  assess                                 General Comments: Pt continues to be nonverbal during session.        Exercises     Shoulder Instructions       General Comments      Pertinent Vitals/ Pain       Pain Assessment: No/denies pain Faces Pain Scale: No hurt  Home Living                                           Prior Functioning/Environment              Frequency  Min 2X/week        Progress Toward Goals  OT Goals(current goals can now be found in the care plan section)  Progress towards OT goals: Progressing toward goals  Acute Rehab OT Goals Patient Stated Goal: unable to state OT Goal Formulation: Patient unable to participate in goal setting Time For Goal Achievement: 05/15/19 Potential to Achieve Goals: Good  Plan Discharge plan remains appropriate    Co-evaluation                 AM-PAC OT "6 Clicks" Daily Activity     Outcome Measure   Help from another person eating meals?: A Lot Help from another person taking care of personal grooming?: A Little Help from another person toileting, which includes using toliet, bedpan, or urinal?: A Lot Help from another person bathing (including washing, rinsing, drying)?: A Lot Help from another person to put on and taking off regular upper body clothing?: A Lot Help from another person to put on and taking off regular lower body clothing?: A Lot 6 Click Score: 13    End of Session Equipment Utilized During Treatment: Gait belt  OT Visit Diagnosis: Unsteadiness on feet (R26.81);Muscle weakness (generalized) (M62.81)   Activity Tolerance Patient tolerated treatment well   Patient Left in chair;with call bell/phone within reach;with chair alarm set;with nursing/sitter in room   Nurse Communication Mobility status        Time: 6767-2094 OT Time Calculation (min): 15 min  Charges: OT General Charges $OT Visit: 1 Visit OT Treatments $Self Care/Home Management : 8-22 mins Corinne Ports [place]. [place], [place] Acute Rehabilitation Services [phone] [place] 05/14/2019, 2:18 PM

## 2019-05-14 NOTE — Progress Notes (Addendum)
STROKE TEAM PROGRESS NOTE   INTERVAL HISTORY NA feeding him lunch and he is eating well. She will also give additional fluids between meals. Labs continue to show improvement (Cre, K). Hope to get rid of IVF soon if he has increased pos.   OBJECTIVE Vitals:   05/13/19 2326 05/14/19 0008 05/14/19 0314 05/14/19 0733  BP: (!) 123/55 140/62 (!) 158/68 (!) 145/54  Pulse: 78 80 84 90  Resp: 16  16 17   Temp: 97.9 F (36.6 C)  98.4 F (36.9 C) 97.8 F (36.6 C)  TempSrc: Oral  Oral Oral  SpO2: 94%  96% 95%  Weight:       CBC:  Recent Labs  Lab 05/12/19 0539 05/14/19 0609  WBC 6.5 8.8  HGB 12.6* 12.5*  HCT 37.3* 36.5*  MCV 93.7 92.6  PLT 220 240   Basic Metabolic Panel:  Recent Labs  Lab 05/12/19 0539 05/12/19 0539 05/12/19 1450 05/14/19 0609  NA 133*  --   --  133*  K 5.2*   < > 4.2 4.2  CL 100  --   --  98  CO2 23  --   --  24  GLUCOSE 104*  --   --  102*  BUN 28*  --   --  24*  CREATININE 1.30*  --   --  1.24  CALCIUM 8.8*  --   --  8.9   < > = values in this interval not displayed.    IMAGING past 24h No results found.   PHYSICAL EXAM      General - Well nourished, well developed elderly caucasian male, in no apparent distress.    Ophthalmologic - fundi not visualized due to noncooperation.  Cardiovascular - Regular rhythm and rate.  Neuro - easily arousable, awake alert, making eye contact, tracking bilaterally. Global aphasia, attempts to follow central or peripheral commands today. Able to laugh, but not speaking today. No gaze preference, blinking to visual threat bilaterally. Right facial droop.  Tongue midline.  Left upper extremity 4/5, left lower extremity at least 3-/5.  Right upper extremity 3/5 with significantly increased muscle tone, and right lower extremity mild withdrawal on pain stimulation 2+/5.  No Babinski. Sensation, coordination and gait not tested. Global bradykinesia and rigidity.    ASSESSMENT/PLAN [name] is a 86 y.o.  male with history of hypertension and  mild dementia (medical care at [facility]), presents to the emergency department with sudden onset of slurred speech, aphasia, left gaze preference with right-sided neglect, right hemiparesis, facial droop and weakness. He received IV t-PA Friday 04/16/19 at 2230.  Stroke: Left ACA territory infarct, likely due to large vessel disease although cardioembolic source not excluded  Code Stroke CT Head - No acute intracranial abnormality, multiple remote lacunar infarcts involving the bilateral basal ganglia, with chronic left cerebellar infarcts.   MRI head - Moderate-sized acute ischemic nonhemorrhagic left ACA territory infarct  CTA H&N - severe distal Left ICA siphon stenosis due to bulky calcified plaque, multifocal Left MCA irregularity and stenoses, up to severe stenoses of both distal VAs, and the Left PCA. bilateral MCA M3, and Left ACA A2 severe stenoses.   EEG - cortical dysfunction in left frontotemporal region, no seizures.  2D Echo - EF 70-75%. No intracardiac source of embolism   LE venous doppler  Negative for DVT   Consider 30 day cardiac event monitoring as outpt vs inpt loop placement (more cost effective)  04/18/19 Virus 2 - negative  LDL -  113  HgbA1c - 5.3  UDS - negative  VTE prophylaxis - lovenox  No antithrombotic prior to admission, now on ASA 325 and plavix DAPT for 3 months and then ASA alone given significant intracranial stenosis  Palliative consult 04/23/19 - Full Code - plan OP referral to Hospice. Continue to follow pt as an IP, routine attempts to contact family.  Therapy recommendations: CIR->SNF   Disposition:  Pending - pt without insurance coverage - SW continues to be involved without change in status   Medically ready for d/c once d/c plan determined.   Hypertension  Home BP meds: Zestril 2.5  On lisinopril to 10 -> 20 mg  On norvasc 5 -> 10 mg  BP stable 130-140s . BP goal  normotensive  Hyperlipidemia  Home Lipid lowering medication: none  LDL 113, goal < 70  Current lipid lowering medication: on lipitor 40  Continue statin at discharge  Dysphagia . Secondary to stroke . D1 thin liquids->D2 thin liquids . Speech on board . Po fluids encouraged to NAs feeding him; likely poor volume of liquid intake  . On IV fluid @50   Mild tachycardia and tachypnea, resolved  afebrile  WBC 6.4  UA negative 2/10, UA repeat unremarkable  CXR NAD 2/10, repeat 04/30/19 - neg.  On IV fluid   Close monitoring  Other Stroke Risk Factors  Advanced age  Hx stroke/TIA - by imaging  Other Active Problems  AKI on CKD - stage 3a, Cre 1.30->1.24 pos encouraged   Dementia - on galantamine and Risperdal   Fall - right knee bruise  attempt trial of sinemet 2/13>>2/18 for possible vascular parkinsonism without improvement, so stopped  Hyperkalemia 5.2. likely hemolyzed. Recheck 1500 2/24 was 4.2. stable againg today at 4.2.  Hospital day # [place], MSN, APRN, ANVP-BC, AGPCNP-BC Advanced Practice Stroke Nurse [place] for Schedule & Pager information 05/14/2019 1:44 PM    ATTENDING NOTE: I reviewed above note and agree with the assessment and plan. Pt was seen and examined.   Pt no acute event overnight. No neuro changes. Lab stable, Cre improved. Na stable at 133. Continue current management. Pending SNF.   [name], MD PhD Stroke Neurology 05/14/2019 5:56 PM     To contact Stroke Continuity provider, please refer to [email or web address] After hours, contact General Neurology

## 2019-05-14 NOTE — Plan of Care (Signed)
  Problem: Education: Goal: Knowledge of disease or condition will improve Outcome: Progressing Goal: Knowledge of secondary prevention will improve Outcome: Progressing Goal: Knowledge of patient specific risk factors addressed and post discharge goals established will improve Outcome: Progressing Goal: Individualized Educational Video(s) Outcome: Progressing   Problem: Education: Goal: Knowledge of General Education information will improve Description: Including pain rating scale, medication(s)/side effects and non-pharmacologic comfort measures Outcome: Progressing   Problem: Health Behavior/Discharge Planning: Goal: Ability to manage health-related needs will improve Outcome: Progressing   Problem: Clinical Measurements: Goal: Ability to maintain clinical measurements within normal limits will improve Outcome: Progressing Goal: Will remain free from infection Outcome: Progressing Goal: Diagnostic test results will improve Outcome: Progressing Goal: Respiratory complications will improve Outcome: Progressing Goal: Cardiovascular complication will be avoided Outcome: Progressing   Problem: Activity: Goal: Risk for activity intolerance will decrease Outcome: Progressing   Problem: Nutrition: Goal: Adequate nutrition will be maintained Outcome: Progressing   Problem: Coping: Goal: Level of anxiety will decrease Outcome: Progressing   Problem: Elimination: Goal: Will not experience complications related to bowel motility Outcome: Progressing Goal: Will not experience complications related to urinary retention Outcome: Progressing   Problem: Pain Managment: Goal: General experience of comfort will improve Outcome: Progressing   Problem: Safety: Goal: Ability to remain free from injury will improve Outcome: Progressing   Problem: Skin Integrity: Goal: Risk for impaired skin integrity will decrease Outcome: Progressing   

## 2019-05-15 NOTE — Progress Notes (Addendum)
STROKE TEAM PROGRESS NOTE   INTERVAL HISTORY Neuro stable. No changes, very long hospitalization, difficult placement pending SNF  OBJECTIVE Vitals:   05/14/19 2307 05/15/19 0419 05/15/19 0759 05/15/19 1200  BP: 131/60 (!) 150/61 (!) 155/62 (!) 126/57  Pulse: 76 73 83 80  Resp: 16 16 19 18   Temp: 98.1 F (36.7 C) 98.1 F (36.7 C) 97.9 F (36.6 C) 97.6 F (36.4 C)  TempSrc: Oral  Oral Oral  SpO2: 96% 100% 99% 97%  Weight:       CBC:  Recent Labs  Lab 05/12/19 0539 05/14/19 0609  WBC 6.5 8.8  HGB 12.6* 12.5*  HCT 37.3* 36.5*  MCV 93.7 92.6  PLT 220 272   Basic Metabolic Panel:  Recent Labs  Lab 05/12/19 0539 05/12/19 0539 05/12/19 1450 05/14/19 0609  NA 133*  --   --  133*  K 5.2*   < > 4.2 4.2  CL 100  --   --  98  CO2 23  --   --  24  GLUCOSE 104*  --   --  102*  BUN 28*  --   --  24*  CREATININE 1.30*  --   --  1.24  CALCIUM 8.8*  --   --  8.9   < > = values in this interval not displayed.    IMAGING past 24h No results found.   PHYSICAL EXAM      General - Well nourished, well developed elderly caucasian male, in no apparent distress.    Ophthalmologic - fundi not visualized due to noncooperation.  Cardiovascular - Regular rhythm and rate.  Neuro - easily arousable, awake alert, making eye contact, tracking bilaterally. Global aphasia, attempts to follow central or peripheral commands today. Able to laugh, but not speaking today. No gaze preference, blinking to visual threat bilaterally. Right facial droop.  Tongue midline.  Left upper extremity 4/5, left lower extremity at least 3-/5.  Right upper extremity 3/5 with significantly increased muscle tone, and right lower extremity mild withdrawal on pain stimulation 2+/5.  No Babinski. Sensation, coordination and gait not tested. Global bradykinesia and rigidity.    ASSESSMENT/PLAN Scott Gilbert is a 86 y.o. male with history of hypertension and  mild dementia (medical care at [facility]), presents to  the emergency department with sudden onset of slurred speech, aphasia, left gaze preference with right-sided neglect, right hemiparesis, facial droop and weakness. He received IV t-PA Friday 04/16/19 at 2230.  Stroke: Left ACA territory infarct, likely due to large vessel disease although cardioembolic source not excluded  Code Stroke CT Head - No acute intracranial abnormality, multiple remote lacunar infarcts involving the bilateral basal ganglia, with chronic left cerebellar infarcts.   MRI head - Moderate-sized acute ischemic nonhemorrhagic left ACA territory infarct  CTA H&N - severe distal Left ICA siphon stenosis due to bulky calcified plaque, multifocal Left MCA irregularity and stenoses, up to severe stenoses of both distal VAs, and the Left PCA. bilateral MCA M3, and Left ACA A2 severe stenoses.   EEG - cortical dysfunction in left frontotemporal region, no seizures.  2D Echo - EF 70-75%. No intracardiac source of embolism   LE venous doppler  Negative for DVT   Consider 30 day cardiac event monitoring as outpt vs inpt loop placement (more cost effective)  [employer] Virus 2 - negative  LDL - 113  HgbA1c - 5.3  UDS - negative  VTE prophylaxis - lovenox  No antithrombotic prior to admission, now on  ASA 325 and plavix DAPT for 3 months and then ASA alone given significant intracranial stenosis  Palliative consult 04/23/19 - Full Code - plan OP referral to Hospice. Continue to follow pt as an IP, routine attempts to contact family.  Therapy recommendations: CIR->SNF   Disposition:  Pending - pt without insurance coverage - SW continues to be involved without change in status   Medically ready for d/c once d/c plan determined.   Hypertension  Home BP meds: Zestril 2.5  On lisinopril to 10 -> 20 mg  On norvasc 5 -> 10 mg  BP stable 130-140s . BP goal normotensive  Hyperlipidemia  Home Lipid lowering medication: none  LDL 113, goal < 70  Current lipid lowering  medication: on lipitor 40  Continue statin at discharge  Dysphagia . Secondary to stroke . D1 thin liquids->D2 thin liquids . Speech on board . Po fluids encouraged to NAs feeding him; likely poor volume of liquid intake  . On IV fluid @50   Mild tachycardia and tachypnea, resolved  afebrile  WBC 6.4  UA negative 2/10, UA repeat unremarkable  CXR NAD 2/10, repeat 04/30/19 - neg.  On IV fluid   Close monitoring  Other Stroke Risk Factors  Advanced age  Hx stroke/TIA - by imaging  Other Active Problems  AKI on CKD - stage 3a, Cre 1.30->1.24 pos encouraged   Dementia - on galantamine and Risperdal   Fall - right knee bruise  attempt trial of sinemet 2/13>>2/18 for possible vascular parkinsonism without improvement, so stopped  Hyperkalemia 5.2. likely hemolyzed. Recheck 1500 2/24 was 4.2. stable againg today at 4.2.  Hospital day # 29  Personally examined patient and images, and have participated in and made any corrections needed to history, physical, neuro exam,assessment and plan as stated above.  I have personally obtained the history, evaluated lab date, reviewed imaging studies and agree with radiology interpretations.    37, MD Stroke Neurology   A total of 15 minutes was spent for the care of this patient, spent on counseling patient and family on different diagnostic and therapeutic options, counseling and coordination of care, riskd ans benefits of management, compliance, or risk factor reduction and education.   To contact Stroke Continuity provider, please refer to Naomie Dean. After hours, contact General Neurology

## 2019-05-16 NOTE — Progress Notes (Signed)
STROKE TEAM PROGRESS NOTE   INTERVAL HISTORY Neuro stable. No changes, very long hospitalization, difficult placement pending SNF  OBJECTIVE Vitals:   05/16/19 0339 05/16/19 0525 05/16/19 0757 05/16/19 1219  BP: (!) 105/37 (!) 126/54 (!) 162/70 (!) 151/65  Pulse: 78 85 78 77  Resp: 18 19 18 19   Temp: 98.5 F (36.9 C) 98.1 F (36.7 C) 98.2 F (36.8 C) 97.7 F (36.5 C)  TempSrc: Oral Oral Oral Oral  SpO2: 95% 96% 100% 97%  Weight:       CBC:  Recent Labs  Lab 05/12/19 0539 05/14/19 0609  WBC 6.5 8.8  HGB 12.6* 12.5*  HCT 37.3* 36.5*  MCV 93.7 92.6  PLT 220 572   Basic Metabolic Panel:  Recent Labs  Lab 05/12/19 0539 05/12/19 0539 05/12/19 1450 05/14/19 0609  NA 133*  --   --  133*  K 5.2*   < > 4.2 4.2  CL 100  --   --  98  CO2 23  --   --  24  GLUCOSE 104*  --   --  102*  BUN 28*  --   --  24*  CREATININE 1.30*  --   --  1.24  CALCIUM 8.8*  --   --  8.9   < > = values in this interval not displayed.    IMAGING past 24h No results found.   PHYSICAL EXAM      General - Well nourished, well developed elderly caucasian male, in no apparent distress.    Ophthalmologic - fundi not visualized due to noncooperation.  Cardiovascular - Regular rhythm and rate.  Neuro - easily arousable, awake alert, making eye contact, tracking bilaterally. Global aphasia, attempts to follow central or peripheral commands today. Able to laugh, but not speaking today. No gaze preference, blinking to visual threat bilaterally. Right facial droop.  Tongue midline.  Left upper extremity 4/5, left lower extremity at least 3-/5.  Right upper extremity 3/5 with significantly increased muscle tone, and right lower extremity mild withdrawal on pain stimulation 2+/5.  No Babinski. Sensation, coordination and gait not tested. Global bradykinesia and rigidity.    ASSESSMENT/PLAN [name] is a 86 y.o. male with history of hypertension and  mild dementia (medical care at [facility]  presents to the emergency department with sudden onset of slurred speech, aphasia, left gaze preference with right-sided neglect, right hemiparesis, facial droop and weakness. He received IV t-PA Friday 04/16/19 at 2230.  Stroke: Left ACA territory infarct, likely due to large vessel disease although cardioembolic source not excluded  Code Stroke CT Head - No acute intracranial abnormality, multiple remote lacunar infarcts involving the bilateral basal ganglia, with chronic left cerebellar infarcts.   MRI head - Moderate-sized acute ischemic nonhemorrhagic left ACA territory infarct  CTA H&N - severe distal Left ICA siphon stenosis due to bulky calcified plaque, multifocal Left MCA irregularity and stenoses, up to severe stenoses of both distal VAs, and the Left PCA. bilateral MCA M3, and Left ACA A2 severe stenoses.   EEG - cortical dysfunction in left frontotemporal region, no seizures.  2D Echo - EF 70-75%. No intracardiac source of embolism   LE venous doppler  Negative for DVT   Consider 30 day cardiac event monitoring as outpt vs inpt loop placement (more cost effective)  [employer] Virus 2 - negative  LDL - 113  HgbA1c - 5.3  UDS - negative  VTE prophylaxis - lovenox  No antithrombotic prior to admission, now  on ASA 325 and plavix DAPT for 3 months and then ASA alone given significant intracranial stenosis  Palliative consult 04/23/19 - Full Code - plan OP referral to Hospice. Continue to follow pt as an IP, routine attempts to contact family.  Therapy recommendations: CIR->SNF   Disposition:  Pending - pt without insurance coverage - SW continues to be involved without change in status   Medically ready for d/c once d/c plan determined.   Hypertension  Home BP meds: Zestril 2.5  On lisinopril to 10 -> 20 mg  On norvasc 5 -> 10 mg  BP stable 130-140s . BP goal normotensive  Hyperlipidemia  Home Lipid lowering medication: none  LDL 113, goal < 70  Current  lipid lowering medication: on lipitor 40  Continue statin at discharge  Dysphagia . Secondary to stroke . D1 thin liquids->D2 thin liquids . Speech on board . Po fluids encouraged to NAs feeding him; likely poor volume of liquid intake  . On IV fluid @50   Mild tachycardia and tachypnea, resolved  afebrile  WBC 6.4  UA negative 2/10, UA repeat unremarkable  CXR NAD 2/10, repeat 04/30/19 - neg.  On IV fluid   Close monitoring  Other Stroke Risk Factors  Advanced age  Hx stroke/TIA - by imaging  Other Active Problems  AKI on CKD - stage 3a, Cre 1.30->1.24 POs encouraged   Dementia - on galantamine and Risperdal   Fall - right knee bruise  Attempt trial of sinemet 2/13>>2/18 for possible vascular parkinsonism without improvement, so stopped  Hyperkalemia 5.2. likely hemolyzed. Recheck 1500 2/24 was 4.2. stable again today at 4.2.  Hospital day # 30  Personally examined patient and images, and have participated in and made any corrections needed to history, physical, neuro exam,assessment and plan as stated above.  I have personally obtained the history, evaluated lab date, reviewed imaging studies and agree with radiology interpretations.   A total of 15 minutes was spent for the care of this patient, spent on counseling patient and family on different diagnostic and therapeutic options, counseling and coordination of care, riskd ans benefits of management, compliance, or risk factor reduction and education.   To contact Stroke Continuity provider, please refer to 3/24. After hours, contact General Neurology

## 2019-05-17 NOTE — Progress Notes (Signed)
Physical Therapy Treatment Patient Details Name: Scott Gilbert MRN: [id number] DOB: 03-Oct-1929 Today's Date: 05/17/2019    History of Present Illness 86 y.o. male with past medical history significant for hypertension and mild dementia. He presented to the emergency department with sudden onset of slurred speech and aphasia, facial droop and weakness.   CT cerebral perfusion on 04/16/19 reported: "acute distal Left ACA territory ischemia and infarct."    PT Comments    Pt supine in bed he is more vocal this session stated his name as "Brysyn" and recognized multiple objects in room when asked he was able to state.  He continues to improve and continues to benefit from skilled rehab in a post acute setting.      Follow Up Recommendations  CIR;Supervision/Assistance - 24 hour     Equipment Recommendations  Wheelchair (measurements PT)    Recommendations for Other Services Rehab consult     Precautions / Restrictions Precautions Precautions: Fall Precaution Comments: Utilized KI for gt training and ace wrapped foot into dorsiflexion. Required Braces or Orthoses: Knee Immobilizer - Right Knee Immobilizer - Right: On when out of bed or walking Restrictions Weight Bearing Restrictions: No    Mobility  Bed Mobility Overal bed mobility: Needs Assistance Bed Mobility: Supine to Sit;Sit to Supine     Supine to sit: Mod assist Sit to supine: Max assist   General bed mobility comments: Pt required assistance to move to edge of bed and elevate trunk into a seated position.  Cues for assistance back to bed to lift LEs and move to [facility].  Transfers Overall transfer level: Needs assistance Equipment used: Rolling walker (2 wheeled) Transfers: Sit to/from Stand Sit to Stand: Mod assist;+2 safety/equipment         General transfer comment: Pt with scissoring when rising to standing but able to correct with cues.  He continues to require assistance to place R  hand.  Ambulation/Gait Ambulation/Gait assistance: Max assist;+2 safety/equipment Gait Distance (Feet): 20 Feet(x2) Assistive device: Rolling walker (2 wheeled) Gait Pattern/deviations: Step-to pattern;Decreased stance time - right;Decreased dorsiflexion - right;Narrow base of support Gait velocity: decreased   General Gait Details: Pt required assistance for advancing RLE forward and keeping pathway of RW.  Pt continues to require external assistance to maintain balance.  He fatigues quickly with activity.   Stairs             Wheelchair Mobility    Modified Rankin (Stroke Patients Only) Modified Rankin (Stroke Patients Only) Pre-Morbid Rankin Score: No symptoms Modified Rankin: Moderately severe disability     Balance Overall balance assessment: Needs assistance   Sitting balance-Leahy Scale: Poor       Standing balance-Leahy Scale: Poor                              Cognition Arousal/Alertness: Awake/alert Behavior During Therapy: WFL for tasks assessed/performed Overall Cognitive Status: Difficult to assess                                 General Comments: Pt continues to be nonverbal during session.      Exercises General Exercises - Lower Extremity Heel Slides: AROM;PROM;Both;10 reps;Supine Hip ABduction/ADduction: AROM;PROM;Both;10 reps;Supine Straight Leg Raises: AROM;PROM;Both;10 reps;Supine    General Comments        Pertinent Vitals/Pain Pain Assessment: 0-10 Pain Score: 5  Pain Location: with RUE ROm Pain  Descriptors / Indicators: Grimacing;Guarding Pain Intervention(s): Monitored during session;Repositioned    Home Living                      Prior Function            PT Goals (current goals can now be found in the care plan section) Acute Rehab PT Goals Patient Stated Goal: unable to state Potential to Achieve Goals: Fair Progress towards PT goals: Progressing toward goals    Frequency     Min 3X/week      PT Plan Current plan remains appropriate    Co-evaluation              AM-PAC PT "6 Clicks" Mobility   Outcome Measure  Help needed turning from your back to your side while in a flat bed without using bedrails?: A Lot Help needed moving from lying on your back to sitting on the side of a flat bed without using bedrails?: A Lot Help needed moving to and from a bed to a chair (including a wheelchair)?: A Lot Help needed standing up from a chair using your arms (e.g., wheelchair or bedside chair)?: A Lot Help needed to walk in hospital room?: Total Help needed climbing 3-5 steps with a railing? : Total 6 Click Score: 10    End of Session Equipment Utilized During Treatment: Gait belt;Right knee immobilizer Activity Tolerance: Patient tolerated treatment well Patient left: with chair alarm set;in bed;with bed alarm set Nurse Communication: Mobility status PT Visit Diagnosis: Other abnormalities of gait and mobility (R26.89);Hemiplegia and hemiparesis;Muscle weakness (generalized) (M62.81);Difficulty in walking, not elsewhere classified (R26.2);Unsteadiness on feet (R26.81) Hemiplegia - Right/Left: Right Hemiplegia - caused by: Cerebral infarction     Time: 0277-4128 PT Time Calculation (min) (ACUTE ONLY): 41 min  Charges:  $Gait Training: 8-22 mins $Therapeutic Exercise: 8-22 mins $Therapeutic Activity: 8-22 mins                     Erasmo Leventhal , PTA Acute Rehabilitation Services Pager [phone] Office [phone]     Milady Fleener [name] 05/17/2019, 6:23 PM

## 2019-05-17 NOTE — Progress Notes (Addendum)
  Speech Language Pathology Treatment: Cognitive-Linquistic  Patient Details Name: Scott Gilbert MRN: [id number] DOB: Aug 20, 1929 Today's Date: 05/17/2019 Time: 0488-8916 SLP Time Calculation (min) (ACUTE ONLY): 14 min  Assessment / Plan / Recommendation Clinical Impression  Pt encountered in bed, HOB raised, for skilled ST. Patient presents with global aphasia, receptive > expressive deficits as well as apraxia of speech. Patient alert, able to engage in verbal greeting ("hello") with min assist. Patient able to verbalize "Javion" when asked to state his name. Patient with impaired verbal initiation. In verbal repetition task, patient stating "bed, window, table, purple, blue, blanket" when paired with visual cue. Pt with difficulty repeating 3-syllable words. In melodic intonation therapy (MIT) task, patient able to verbalize functional phrases "hello" and "goodbye," and sing a portion of happy birthday. ST unable to obtain a consistent yes/no response despite trials with verbalizations, pointing, eye gaze, blinking. Pt with oral perseveration noted t/o session, indicative of apraxia of speech/oral apraxia. ST to sign off for now at the acute care level.  Patient will benefit from skilled ST at the next venue of care (inpatient rehab vs SNF) to continue to target deficits.   HPI HPI: Jensen Kilburg is a 86 y.o. male with past medical history significant for hypertension, mild dementia presented to the emergency department with sudden onset of slurred speech and aphasia, facial droop and weakness.   CT cerebral perfusion on 04/16/19 reported: "acute distal Left ACA territory ischemia and infarct."       SLP Plan  Discharge SLP treatment due to (comment)(goal met/adequate for d/c at acute level)       Recommendations                   Follow up Recommendations: Inpatient Rehab;24 hour supervision/assistance SLP Visit Diagnosis: Aphasia (R47.01) Plan: Discharge SLP treatment due to  (comment)(goal met/adequate for d/c at acute level)       GO                  05/17/2019, 2:25 PM Marina Goodell, M.Ed., CCC-SLP Speech Therapy Acute Rehabilitation [phone]: Acute Rehab office [phone] - pager

## 2019-05-17 NOTE — Progress Notes (Signed)
STROKE TEAM PROGRESS NOTE   INTERVAL HISTORY Patient is sitting up in bed.  He answers very basic simple questions like his name and age but most of the time speech is rambling and difficult to understand.  He does not follow commands consistently.  Vital signs are stable.  OBJECTIVE Vitals:   05/16/19 2349 05/17/19 0400 05/17/19 0758 05/17/19 1240  BP:  (!) 137/47 (!) 129/57 (!) 141/53  Pulse:  91 78 80  Resp: 18 18    Temp:  98.4 F (36.9 C) 97.9 F (36.6 C) 98.6 F (37 C)  TempSrc:  Oral Oral Oral  SpO2:  93% 95% 98%  Weight:       CBC:  Recent Labs  Lab 05/12/19 0539 05/14/19 0609  WBC 6.5 8.8  HGB 12.6* 12.5*  HCT 37.3* 36.5*  MCV 93.7 92.6  PLT 220 323   Basic Metabolic Panel:  Recent Labs  Lab 05/12/19 0539 05/12/19 0539 05/12/19 1450 05/14/19 0609  NA 133*  --   --  133*  K 5.2*   < > 4.2 4.2  CL 100  --   --  98  CO2 23  --   --  24  GLUCOSE 104*  --   --  102*  BUN 28*  --   --  24*  CREATININE 1.30*  --   --  1.24  CALCIUM 8.8*  --   --  8.9   < > = values in this interval not displayed.    IMAGING past 24h No results found.   PHYSICAL EXAM        General - Well nourished, well developed elderly caucasian male, in no apparent distress.    Ophthalmologic - fundi not visualized due to noncooperation.  Cardiovascular - Regular rhythm and rate.  Neuro - easily arousable, awake alert, making eye contact, tracking bilaterally. Global aphasia, attempts to follow central or peripheral commands today. Able to laugh, but not speaking today. No gaze preference, blinking to visual threat bilaterally. Right facial droop.  Tongue midline.  Left upper extremity 4/5, left lower extremity at least 3-/5.  Right upper extremity 3/5 with significantly increased muscle tone, and right lower extremity mild withdrawal on pain stimulation 2+/5.  No Babinski. Sensation, coordination and gait not tested. Global bradykinesia and rigidity.    ASSESSMENT/PLAN Scott Gilbert is a 86 y.o. male with history of hypertension and  mild dementia (medical care at [facility]), presents to the emergency department with sudden onset of slurred speech, aphasia, left gaze preference with right-sided neglect, right hemiparesis, facial droop and weakness. He received IV t-PA Friday 04/16/19 at 2230.  Stroke: Left ACA territory infarct, likely due to large vessel disease although cardioembolic source not excluded  Code Stroke CT Head - No acute intracranial abnormality, multiple remote lacunar infarcts involving the bilateral basal ganglia, with chronic left cerebellar infarcts.   MRI head - Moderate-sized acute ischemic nonhemorrhagic left ACA territory infarct  CTA H&N - severe distal Left ICA siphon stenosis due to bulky calcified plaque, multifocal Left MCA irregularity and stenoses, up to severe stenoses of both distal VAs, and the Left PCA. bilateral MCA M3, and Left ACA A2 severe stenoses.   EEG - cortical dysfunction in left frontotemporal region, no seizures.  2D Echo - EF 70-75%. No intracardiac source of embolism   LE venous doppler  Negative for DVT   Consider 30 day cardiac event monitoring as outpt vs inpt loop placement (more cost effective)  Scott Gilbert  [employer] Virus 2 - negative  LDL - 113  HgbA1c - 5.3  UDS - negative  VTE prophylaxis - lovenox  No antithrombotic prior to admission, now on ASA 325 and plavix DAPT for 3 months and then ASA alone given significant intracranial stenosis  Palliative consult 04/23/19 - Full Code - plan OP referral to Hospice. Continue to follow pt as an IP, routine attempts to contact family.  Therapy recommendations: CIR->SNF   Disposition:  Pending - pt without insurance coverage - SW continues to be involved without change in status   Medically ready for d/c once d/c plan determined.   Hypertension  Home BP meds: Zestril 2.5  On lisinopril to 10 -> 20 mg  On norvasc 5 -> 10 mg  BP stable 130-140s . BP goal  normotensive  Hyperlipidemia  Home Lipid lowering medication: none  LDL 113, goal < 70  Current lipid lowering medication: on lipitor 40  Continue statin at discharge  Dysphagia . Secondary to stroke . D1 thin liquids->D2 thin liquids . Speech on board . Po fluids encouraged to NAs feeding him; likely poor volume of liquid intake  . On IV fluid @50   Mild tachycardia and tachypnea, resolved  afebrile  WBC 6.4  UA negative 2/10, UA repeat unremarkable  CXR NAD 2/10, repeat 04/30/19 - neg.  On IV fluid   Close monitoring  Other Stroke Risk Factors  Advanced age  Hx stroke/TIA - by imaging  Other Active Problems  AKI on CKD - stage 3a, Cre 1.24 POs encouraged   Dementia - on galantamine and Risperdal   Fall - right knee bruise  Attempt trial of sinemet 2/13>>2/18 for possible vascular parkinsonism without improvement, so stopped  Hyperkalemia 4.2 resolved  Hospital day # 31  Continue ongoing management.  Patient is difficult placement to skilled nursing facility but is medically stable if bed becomes available 09-17-1979, MD  To contact Stroke Continuity provider, please refer to [name]. After hours, contact General Neurology

## 2019-05-18 NOTE — Progress Notes (Signed)
Physical Therapy Treatment Patient Details Name: Scott Gilbert MRN: [id number] DOB: [birth year] Today's Date: 05/18/2019    History of Present Illness 86 y.o. male with past medical history significant for hypertension and mild dementia. He presented to the emergency department with sudden onset of slurred speech and aphasia, facial droop and weakness.   CT cerebral perfusion on 04/16/19 reported: "acute distal Left ACA territory ischemia and infarct."    PT Comments    Pt very agreeable to therapy and working on functional mobility. Does not attempt verbalization on his own frequently but is attempting to respond to questions, stated name and gave several one word answers. Ambulated 8' to sink with RW and max A +2. Performed 2 bouts of standing activity at sink and then ambulated 8' with RW and max A +2. Continues to need manual facilitation for RLE swing through and placement for stance. KI used to prevent knee buckling. PT will continue to follow.    Follow Up Recommendations  CIR;Supervision/Assistance - 24 hour     Equipment Recommendations  Wheelchair (measurements PT)    Recommendations for Other Services Rehab consult     Precautions / Restrictions Precautions Precautions: Fall Precaution Comments: Utilized KI for gt training and ace wrapped foot into dorsiflexion. Required Braces or Orthoses: Knee Immobilizer - Right Knee Immobilizer - Right: On when out of bed or walking Restrictions Weight Bearing Restrictions: No    Mobility  Bed Mobility Overal bed mobility: Needs Assistance Bed Mobility: Supine to Sit   Sidelying to sit: Mod assist Supine to sit: Mod assist     General bed mobility comments: pt initiates LE's to L side of bed with vc's. Mod A to complete task, partially due to KI. Mod A at trunk for final elevation to sitting as well  Transfers Overall transfer level: Needs assistance Equipment used: Rolling walker (2 wheeled) Transfers: Sit to/from  Stand Sit to Stand: Mod assist;+2 safety/equipment         General transfer comment: stood from bed and recliner several times. Assist for foot placement to prevent scissoring. Assist to place R hand on RW and to release with return to sitting (sometimes able to use L hand to assist R on his own)  Ambulation/Gait Ambulation/Gait assistance: Max assist;+2 safety/equipment Gait Distance (Feet): 8 Feet(2x) Assistive device: Rolling walker (2 wheeled) Gait Pattern/deviations: Step-to pattern;Decreased stance time - right;Decreased dorsiflexion - right;Narrow base of support Gait velocity: decreased Gait velocity interpretation: <1.31 ft/sec, indicative of household ambulator General Gait Details: Pt required assistance for advancing RLE forward and keeping pathway of RW.  Pt continues to require external assistance to maintain balance.  He fatigues quickly with activity.   Stairs             Wheelchair Mobility    Modified Rankin (Stroke Patients Only) Modified Rankin (Stroke Patients Only) Pre-Morbid Rankin Score: No symptoms Modified Rankin: Moderately severe disability     Balance Overall balance assessment: Needs assistance Sitting-balance support: Feet supported;Single extremity supported Sitting balance-Leahy Scale: Fair Sitting balance - Comments: maintain sitting EOB without assist Postural control: Right lateral lean;Posterior lean Standing balance support: Bilateral upper extremity supported;During functional activity Standing balance-Leahy Scale: Poor Standing balance comment: External assistance to maintain standing.                            Cognition Arousal/Alertness: Awake/alert Behavior During Therapy: WFL for tasks assessed/performed Overall Cognitive Status: Difficult to assess  General Comments: pt verbalizing a few one word answers to questions and stating name. Follows about 50% of one step  commands      Exercises Hand Exercises Digit Composite Flexion: PROM;5 reps;Right Digit Composite Abduction: PROM;5 reps;Right Thumb Abduction: PROM;Right;5 reps Opposition: PROM;Right;5 reps    General Comments General comments (skin integrity, edema, etc.): worked on standing balance activities at sink with OT      Pertinent Vitals/Pain Pain Assessment: No/denies pain Faces Pain Scale: No hurt    Home Living                      Prior Function            PT Goals (current goals can now be found in the care plan section) Acute Rehab PT Goals Patient Stated Goal: unable to state PT Goal Formulation: Patient unable to participate in goal setting Time For Goal Achievement: 05/30/19 Potential to Achieve Goals: Fair Progress towards PT goals: Progressing toward goals    Frequency    Min 3X/week      PT Plan Current plan remains appropriate    Co-evaluation PT/OT/SLP Co-Evaluation/Treatment: Yes Reason for Co-Treatment: Complexity of the patient's impairments (multi-system involvement);For patient/therapist safety;Necessary to address cognition/behavior during functional activity PT goals addressed during session: Mobility/safety with mobility;Balance;Proper use of DME;Strengthening/ROM OT goals addressed during session: ADL's and self-care;Strengthening/ROM      AM-PAC PT "6 Clicks" Mobility   Outcome Measure  Help needed turning from your back to your side while in a flat bed without using bedrails?: A Lot Help needed moving from lying on your back to sitting on the side of a flat bed without using bedrails?: A Lot Help needed moving to and from a bed to a chair (including a wheelchair)?: A Lot Help needed standing up from a chair using your arms (e.g., wheelchair or bedside chair)?: A Lot Help needed to walk in hospital room?: Total Help needed climbing 3-5 steps with a railing? : Total 6 Click Score: 10    End of Session Equipment Utilized During  Treatment: Gait belt;Right knee immobilizer Activity Tolerance: Patient tolerated treatment well Patient left: with chair alarm set;in chair;with call bell/phone within reach Nurse Communication: Mobility status PT Visit Diagnosis: Other abnormalities of gait and mobility (R26.89);Hemiplegia and hemiparesis;Muscle weakness (generalized) (M62.81);Difficulty in walking, not elsewhere classified (R26.2);Unsteadiness on feet (R26.81) Hemiplegia - Right/Left: Right Hemiplegia - caused by: Cerebral infarction     Time: 2993-7169 PT Time Calculation (min) (ACUTE ONLY): 43 min  Charges:  $Gait Training: 8-22 mins                     Leighton Roach, [place]  Pager [phone] Office [place] 05/18/2019, 1:56 PM

## 2019-05-18 NOTE — Progress Notes (Signed)
Occupational Therapy Treatment Patient Details Name: Scott Gilbert MRN: [id number] DOB: 07-08-1929 Today's Date: 05/18/2019    History of present illness 86 y.o. male with past medical history significant for hypertension and mild dementia. He presented to the emergency department with sudden onset of slurred speech and aphasia, facial droop and weakness.   CT cerebral perfusion on 04/16/19 reported: "acute distal Left ACA territory ischemia and infarct."   OT comments  Patient continues to make steady progress towards goals in skilled OT session. Patient's session encompassed cotreat with PT in order to progress in functional mobility and complete ADLs in standing at the sink to regain prior level of function. Pt continues to require PROM to R upper extremity, however demonstrated no pain to movement. Pt requires max A in order to release hand but is now starting to use left hand to manipulate on own. Pt more receptive to one step commands when completing ADLs at sink. Pt would continue to benefit from CIR in order to promote increased independence and functional mobility; will follow acutely.     Follow Up Recommendations  CIR;Supervision/Assistance - 24 hour    Equipment Recommendations  Other (comment)(Defer to next level of care)    Recommendations for Other Services      Precautions / Restrictions Precautions Precautions: Fall Precaution Comments: Utilized KI for gt training and ace wrapped foot into dorsiflexion. Required Braces or Orthoses: Knee Immobilizer - Right Knee Immobilizer - Right: On when out of bed or walking Restrictions Weight Bearing Restrictions: No       Mobility Bed Mobility Overal bed mobility: Needs Assistance Bed Mobility: Supine to Sit;Sit to Supine   Sidelying to sit: Mod assist       General bed mobility comments: Pt required assistance to move to edge of bed and elevate trunk into a seated position.  Transfers Overall transfer level: Needs  assistance Equipment used: Rolling walker (2 wheeled) Transfers: Sit to/from Stand Sit to Stand: Mod assist;+2 safety/equipment         General transfer comment: Pt with scissoring when rising to standing and with gait and unable to correct with cues (physical manipulation from PT to advance and spread legs) this session.  He continues to require assistance to place R hand.    Balance Overall balance assessment: Needs assistance Sitting-balance support: Feet supported;Single extremity supported Sitting balance-Leahy Scale: Poor   Postural control: Right lateral lean;Posterior lean Standing balance support: Bilateral upper extremity supported;During functional activity Standing balance-Leahy Scale: Poor Standing balance comment: External assistance to maintain standing.                           ADL either performed or assessed with clinical judgement   ADL Overall ADL's : Needs assistance/impaired     Grooming: Wash/dry face;Wash/dry hands;Oral care;Moderate assistance;Maximal assistance;Standing;Cueing for sequencing Grooming Details (indicate cue type and reason): Able to initiate with min-mod verbal cues and tactile cues                   Toilet Transfer Details (indicate cue type and reason): simulated via functional mobility with RW and MAX A +2; pt buckling in BLEs needing assist to advance RLE during functional mobility         Functional mobility during ADLs: +2 for physical assistance;+2 for safety/equipment;Moderate assistance;Minimal assistance General ADL Comments: Pt gradually making gains with ADL's, slowly starting to heed to commands     Vision  Perception     Praxis      Cognition Arousal/Alertness: Awake/alert Behavior During Therapy: WFL for tasks assessed/performed Overall Cognitive Status: Difficult to assess                                 General Comments: Pt continues to be nonverbal during session.         Exercises Hand Exercises Digit Composite Flexion: PROM;5 reps;Right Digit Composite Abduction: PROM;5 reps;Right Thumb Abduction: PROM;Right;5 reps Opposition: PROM;Right;5 reps   Shoulder Instructions       General Comments      Pertinent Vitals/ Pain       Pain Assessment: No/denies pain Faces Pain Scale: No hurt  Home Living                                          Prior Functioning/Environment              Frequency  Min 2X/week        Progress Toward Goals  OT Goals(current goals can now be found in the care plan section)  Progress towards OT goals: Progressing toward goals  Acute Rehab OT Goals Patient Stated Goal: unable to state OT Goal Formulation: Patient unable to participate in goal setting Time For Goal Achievement: 05/20/19 Potential to Achieve Goals: Good  Plan Discharge plan remains appropriate    Co-evaluation    PT/OT/SLP Co-Evaluation/Treatment: Yes Reason for Co-Treatment: Complexity of the patient's impairments (multi-system involvement)   OT goals addressed during session: ADL's and self-care;Strengthening/ROM      AM-PAC OT "6 Clicks" Daily Activity     Outcome Measure   Help from another person eating meals?: A Lot Help from another person taking care of personal grooming?: A Little Help from another person toileting, which includes using toliet, bedpan, or urinal?: A Lot Help from another person bathing (including washing, rinsing, drying)?: A Lot Help from another person to put on and taking off regular upper body clothing?: A Lot Help from another person to put on and taking off regular lower body clothing?: A Lot 6 Click Score: 13    End of Session Equipment Utilized During Treatment: Gait belt;Rolling walker  OT Visit Diagnosis: Unsteadiness on feet (R26.81);Muscle weakness (generalized) (M62.81)   Activity Tolerance Patient tolerated treatment well   Patient Left in chair;with call bell/phone  within reach;with chair alarm set   Nurse Communication Mobility status        Time: 4627-0350 OT Time Calculation (min): 43 min  Charges: OT General Charges $OT Visit: 1 Visit OT Treatments $Self Care/Home Management : 23-37 mins  Pollyann Glen E. Lalitha Ilyas, COTA/L Acute Rehabilitation Services [phone]   Cherlyn Cushing 05/18/2019, 12:46 PM

## 2019-05-18 NOTE — Progress Notes (Signed)
STROKE TEAM PROGRESS NOTE   INTERVAL HISTORY Patient is sitting up in bed.  He answers very basic simple questions like his name and age but most of the time speech is rambling and difficult to understand.  He does not follow commands consistently.  Vital signs are stable.No changes  OBJECTIVE Vitals:   05/17/19 2329 05/18/19 0400 05/18/19 0729 05/18/19 1147  BP: (!) 157/86 119/70 (!) 147/63 (!) 144/65  Pulse: 86 79 76 87  Resp: 19 18 16 16   Temp: 98.5 F (36.9 C) 98.6 F (37 C) 98.4 F (36.9 C) 98.4 F (36.9 C)  TempSrc: Oral Oral Oral Oral  SpO2: 99% 94% 96% 97%  Weight:       CBC:  Recent Labs  Lab 05/12/19 0539 05/14/19 0609  WBC 6.5 8.8  HGB 12.6* 12.5*  HCT 37.3* 36.5*  MCV 93.7 92.6  PLT 220 025   Basic Metabolic Panel:  Recent Labs  Lab 05/12/19 0539 05/12/19 0539 05/12/19 1450 05/14/19 0609  NA 133*  --   --  133*  K 5.2*   < > 4.2 4.2  CL 100  --   --  98  CO2 23  --   --  24  GLUCOSE 104*  --   --  102*  BUN 28*  --   --  24*  CREATININE 1.30*  --   --  1.24  CALCIUM 8.8*  --   --  8.9   < > = values in this interval not displayed.    IMAGING past 24h No results found.   PHYSICAL EXAM        General - Well nourished, well developed elderly caucasian male, in no apparent distress.    Ophthalmologic - fundi not visualized due to noncooperation.  Cardiovascular - Regular rhythm and rate.  Neuro - easily arousable, awake alert, making eye contact, tracking bilaterally. Global aphasia, attempts to follow central or peripheral commands today. Able to laugh, but not speaking today. No gaze preference, blinking to visual threat bilaterally. Right facial droop.  Tongue midline.  Left upper extremity 4/5, left lower extremity at least 3-/5.  Right upper extremity 3/5 with significantly increased muscle tone, and right lower extremity mild withdrawal on pain stimulation 2+/5.  No Babinski. Sensation, coordination and gait not tested. Global bradykinesia and  rigidity.    ASSESSMENT/PLAN [name] is a 86 y.o. male with history of hypertension and  mild dementia (medical care at [facility]), presents to the emergency department with sudden onset of slurred speech, aphasia, left gaze preference with right-sided neglect, right hemiparesis, facial droop and weakness. He received IV t-PA Friday 04/16/19 at 2230.  Stroke: Left ACA territory infarct, likely due to large vessel disease although cardioembolic source not excluded  Code Stroke CT Head - No acute intracranial abnormality, multiple remote lacunar infarcts involving the bilateral basal ganglia, with chronic left cerebellar infarcts.   MRI head - Moderate-sized acute ischemic nonhemorrhagic left ACA territory infarct  CTA H&N - severe distal Left ICA siphon stenosis due to bulky calcified plaque, multifocal Left MCA irregularity and stenoses, up to severe stenoses of both distal VAs, and the Left PCA. bilateral MCA M3, and Left ACA A2 severe stenoses.   EEG - cortical dysfunction in left frontotemporal region, no seizures.  2D Echo - EF 70-75%. No intracardiac source of embolism   LE venous doppler  Negative for DVT   Consider 30 day cardiac event monitoring as outpt vs inpt loop placement (more  cost effective)  [employer] Virus 2 - negative  LDL - 113  HgbA1c - 5.3  UDS - negative  VTE prophylaxis - lovenox  No antithrombotic prior to admission, now on ASA 325 and plavix DAPT for 3 months and then ASA alone given significant intracranial stenosis  Palliative consult 04/23/19 - Full Code - plan OP referral to Hospice. Continue to follow pt as an IP, routine attempts to contact family.  Therapy recommendations: CIR->SNF   Disposition:  Pending - pt without insurance coverage - SW continues to be involved without change in status   Medically ready for d/c once d/c plan determined.   Hypertension  Home BP meds: Zestril 2.5  On lisinopril to 10 -> 20 mg  On norvasc 5 -> 10  mg  BP stable 130-140s . BP goal normotensive  Hyperlipidemia  Home Lipid lowering medication: none  LDL 113, goal < 70  Current lipid lowering medication: on lipitor 40  Continue statin at discharge  Dysphagia . Secondary to stroke . D1 thin liquids->D2 thin liquids . Speech on board . Po fluids encouraged to NAs feeding him; likely poor volume of liquid intake  . On IV fluid @50   Mild tachycardia and tachypnea, resolved  afebrile  WBC 6.4  UA negative 2/10, UA repeat unremarkable  CXR NAD 2/10, repeat 04/30/19 - neg.  On IV fluid   Close monitoring  Other Stroke Risk Factors  Advanced age  Hx stroke/TIA - by imaging  Other Active Problems  AKI on CKD - stage 3a, Cre 1.24 POs encouraged   Dementia - on galantamine and Risperdal   Fall - right knee bruise  Attempt trial of sinemet 2/13>>2/18 for possible vascular parkinsonism without improvement, so stopped  Hyperkalemia 4.2 resolved  Hospital day # 32    Patient is difficult placement to skilled nursing facility but is medically stable if bed becomes available  09-17-1979, MD  To contact Stroke Continuity provider, please refer to [name]. After hours, contact General Neurology

## 2019-05-19 NOTE — Progress Notes (Signed)
STROKE TEAM PROGRESS NOTE   INTERVAL HISTORY He is sitting up in bed.  He speaks occasional words and follows only occasional midline commands.  No changes.  Vital signs stable.  OBJECTIVE Vitals:   05/18/19 2342 05/19/19 0405 05/19/19 0922 05/19/19 1224  BP: (!) 138/57 (!) 146/61 (!) 152/62 (!) 129/56  Pulse: 76 77 79 71  Resp: 18 16 (!) 25 16  Temp: 98.1 F (36.7 C) 98.5 F (36.9 C) 98.1 F (36.7 C) 98 F (36.7 C)  TempSrc: Oral Oral Oral Oral  SpO2: 97% 98% 98% 97%  Weight:       CBC:  Recent Labs  Lab 05/14/19 0609  WBC 8.8  HGB 12.5*  HCT 36.5*  MCV 92.6  PLT 151   Basic Metabolic Panel:  Recent Labs  Lab 05/14/19 0609  NA 133*  K 4.2  CL 98  CO2 24  GLUCOSE 102*  BUN 24*  CREATININE 1.24  CALCIUM 8.9    IMAGING past 24h No results found.   PHYSICAL EXAM          General - Well nourished, well developed elderly caucasian male, in no apparent distress.    Ophthalmologic - fundi not visualized due to noncooperation.  Cardiovascular - Regular rhythm and rate.  Neuro - easily arousable, awake alert, making eye contact, tracking bilaterally. Global aphasia, attempts to follow central or peripheral commands today. Able to laugh, but not speaking today. No gaze preference, blinking to visual threat bilaterally. Right facial droop.  Tongue midline.  Left upper extremity 4/5, left lower extremity at least 3-/5.  Right upper extremity 3/5 with significantly increased muscle tone, and right lower extremity mild withdrawal on pain stimulation 2+/5.  No Babinski. Sensation, coordination and gait not tested. Global bradykinesia and rigidity.    ASSESSMENT/PLAN Scott Gilbert is a 86 y.o. male with history of hypertension and  mild dementia (medical care at [facility]), presents to the emergency department with sudden onset of slurred speech, aphasia, left gaze preference with right-sided neglect, right hemiparesis, facial droop and weakness. He received IV t-PA Friday  04/16/19 at 2230.  Stroke: Left ACA territory infarct, likely due to large vessel disease although cardioembolic source not excluded  Code Stroke CT Head - No acute intracranial abnormality, multiple remote lacunar infarcts involving the bilateral basal ganglia, with chronic left cerebellar infarcts.   MRI head - Moderate-sized acute ischemic nonhemorrhagic left ACA territory infarct  CTA H&N - severe distal Left ICA siphon stenosis due to bulky calcified plaque, multifocal Left MCA irregularity and stenoses, up to severe stenoses of both distal VAs, and the Left PCA. bilateral MCA M3, and Left ACA A2 severe stenoses.   EEG - cortical dysfunction in left frontotemporal region, no seizures.  2D Echo - EF 70-75%. No intracardiac source of embolism   LE venous doppler  Negative for DVT   Consider 30 day cardiac event monitoring as outpt vs inpt loop placement (more cost effective)  [employer] Virus 2 - negative  LDL - 113  HgbA1c - 5.3  UDS - negative  VTE prophylaxis - lovenox  No antithrombotic prior to admission, now on ASA 325 and plavix DAPT for 3 months and then ASA alone given significant intracranial stenosis  Palliative consult 04/23/19 - Full Code - plan OP referral to Hospice. Continue to follow pt as an IP, routine attempts to contact family.  Therapy recommendations: CIR->SNF   Disposition:  Pending - pt without insurance coverage - SW continues to be  involved without change in status   Medically ready for d/c once d/c plan determined.   Hypertension  Home BP meds: Zestril 2.5  On lisinopril to 10 -> 20 mg  On norvasc 5 -> 10 mg  BP stable 130-150s . BP goal normotensive . Monitor   Hyperlipidemia  Home Lipid lowering medication: none  LDL 113, goal < 70  Current lipid lowering medication: on lipitor 40  Continue statin at discharge  Dysphagia . Secondary to stroke . D1 thin liquids->D2 thin liquids . Speech on board . Good po intake reported by  RN . Off IV fluid yesterday . Repeat labs Friday  Mild tachycardia and tachypnea, resolved  afebrile  WBC 6.4  UA negative 2/10, UA repeat unremarkable  CXR NAD 2/10, repeat 04/30/19 - neg.  Other Stroke Risk Factors  Advanced age  Hx stroke/TIA - by imaging  Other Active Problems  AKI on CKD - stage 3a, Cre 1.24 POs encouraged   Dementia - on galantamine and Risperdal   Fall - right knee bruise  Attempt trial of sinemet 2/13>>2/18 for possible vascular parkinsonism without improvement, so stopped  Hyperkalemia 4.2 resolved  Hospital day # 33  No neurological changes.  Patient is difficult placement to skilled nursing facility but is medically stable if bed becomes available  [name], MD  To contact Stroke Continuity provider, please refer to [email or web address]. After hours, contact General Neurology

## 2019-05-20 NOTE — Progress Notes (Signed)
STROKE TEAM PROGRESS NOTE   INTERVAL HISTORY Just up in chair after working with therapy. Doing well. Will name objects and follow 1-step commands.   OBJECTIVE Vitals:   05/19/19 1224 05/19/19 1550 05/20/19 0359 05/20/19 0821  BP: (!) 129/56 131/66 (!) 159/73 (!) 150/63  Pulse: 71 71 70 82  Resp: 16 16  16   Temp: 98 F (36.7 C) 98.2 F (36.8 C)  98.2 F (36.8 C)  TempSrc: Oral Oral  Oral  SpO2: 97% 95% 99% 97%  Weight:       CBC:  Recent Labs  Lab 05/14/19 0609  WBC 8.8  HGB 12.5*  HCT 36.5*  MCV 92.6  PLT 240   Basic Metabolic Panel:  Recent Labs  Lab 05/14/19 0609  NA 133*  K 4.2  CL 98  CO2 24  GLUCOSE 102*  BUN 24*  CREATININE 1.24  CALCIUM 8.9    IMAGING past 24h No results found.   PHYSICAL EXAM         General - Well nourished, well developed elderly caucasian male, in no apparent distress.    Ophthalmologic - fundi not visualized due to noncooperation.  Cardiovascular - Regular rhythm and rate.  Neuro - easily arousable, awake alert, making eye contact, tracking bilaterally. Global aphasia, will follow occasional commands on the L. Able to laugh, and will name objects including self. ay. No gaze preference, blinking to visual threat bilaterally. Right facial droop.  Tongue midline.  Left upper extremity 4/5, left lower extremity at least 3-/5.  Right upper extremity 3/5 with significantly increased muscle tone, and right lower extremity mild withdrawal on pain stimulation 2+/5.  No Babinski. Sensation, coordination and gait not tested. Global bradykinesia and rigidity.    ASSESSMENT/PLAN [name] is a 86 y.o. male with history of hypertension and  mild dementia (medical care at [facility]), presents to the emergency department with sudden onset of slurred speech, aphasia, left gaze preference with right-sided neglect, right hemiparesis, facial droop and weakness. He received IV t-PA Friday 04/16/19 at 2230.  Stroke: Left ACA territory infarct,  likely due to large vessel disease although cardioembolic source not excluded  Code Stroke CT Head - No acute intracranial abnormality, multiple remote lacunar infarcts involving the bilateral basal ganglia, with chronic left cerebellar infarcts.   MRI head - Moderate-sized acute ischemic nonhemorrhagic left ACA territory infarct  CTA H&N - severe distal Left ICA siphon stenosis due to bulky calcified plaque, multifocal Left MCA irregularity and stenoses, up to severe stenoses of both distal VAs, and the Left PCA. bilateral MCA M3, and Left ACA A2 severe stenoses.   EEG - cortical dysfunction in left frontotemporal region, no seizures.  2D Echo - EF 70-75%. No intracardiac source of embolism   LE venous doppler  Negative for DVT   Consider 30 day cardiac event monitoring as outpt vs inpt loop placement (more cost effective)  04/18/19 Virus 2 - negative  LDL - 113  HgbA1c - 5.3  UDS - negative  VTE prophylaxis - lovenox  No antithrombotic prior to admission, now on ASA 325 and plavix DAPT for 3 months and then ASA alone given significant intracranial stenosis  Palliative consult 04/23/19 - Full Code - plan OP referral to Hospice. Continue to follow pt as an IP, routine attempts to contact family.  Therapy recommendations: CIR->SNF   Disposition:  Pending - pt without insurance coverage - SW continues to be involved without change in status   Medically ready for  d/c once d/c plan determined.   Hypertension  Home BP meds: Zestril 2.5  On lisinopril to 10 -> 20 mg  On norvasc 5 -> 10 mg  BP stable 130-150s . BP goal normotensive . Monitor   Hyperlipidemia  Home Lipid lowering medication: none  LDL 113, goal < 70  Current lipid lowering medication: on lipitor 40  Continue statin at discharge  Dysphagia . Secondary to stroke . D1 thin liquids->D2 thin liquids . Speech on board . Good po intake reported by RN . Off IV fluid yesterday . Repeat labs  Friday  Mild tachycardia and tachypnea, resolved  afebrile  WBC 6.4  UA negative 2/10, UA repeat unremarkable  CXR NAD 2/10, repeat 04/30/19 - neg.  Other Stroke Risk Factors  Advanced age  Hx stroke/TIA - by imaging  Other Active Problems  AKI on CKD - stage 3a, Cre 1.24 POs encouraged   Dementia - on galantamine and Risperdal   Fall - right knee bruise  Attempt trial of sinemet 2/13>>2/18 for possible vascular parkinsonism without improvement, so stopped  Hyperkalemia 4.2 resolved  Hospital day # 34  .  Patient is difficult placement to skilled nursing facility but is medically stable if bed becomes available  Antony Contras, MD  To contact Stroke Continuity provider, please refer to [email or web address] After hours, contact General Neurology

## 2019-05-20 NOTE — Progress Notes (Signed)
Physical Therapy Treatment Patient Details Name: Scott Gilbert MRN: [id number] DOB: 10-Aug-1929 Today's Date: 05/20/2019    History of Present Illness 86 y.o. male with past medical history significant for hypertension and mild dementia. He presented to the emergency department with sudden onset of slurred speech and aphasia, facial droop and weakness.   CT cerebral perfusion on 04/16/19 reported: "acute distal Left ACA territory ischemia and infarct."    PT Comments    Pt performed co treat with PTA and COTA this session.  Focused on gait training and use of R hand for function.  He is making more purposeful movements with R hand this session.  Plan for post acute rehab remains appropriate.     Follow Up Recommendations  CIR;Supervision/Assistance - 24 hour     Equipment Recommendations  Wheelchair (measurements PT)    Recommendations for Other Services       Precautions / Restrictions Precautions Precautions: Fall Precaution Comments: Utilized KI for gt training and ace wrapped foot into dorsiflexion. Required Braces or Orthoses: Knee Immobilizer - Right(ace wrap into dorsiflex) Knee Immobilizer - Right: On when out of bed or walking Restrictions Weight Bearing Restrictions: No    Mobility  Bed Mobility Overal bed mobility: Needs Assistance Bed Mobility: Supine to Sit;Sit to Supine Rolling: Mod assist;+2 for physical assistance Sidelying to sit: Mod assist;+2 for physical assistance       General bed mobility comments: Pt required assistance to move to edge of bed this session.  He attempted to use R hand for function but unable to let go of rail once finished pulling on it.  Pt required moderate assistance to elevate trunk into standing.  Transfers Overall transfer level: Needs assistance Equipment used: Rolling walker (2 wheeled) Transfers: Sit to/from Stand Sit to Stand: Mod assist;+2 safety/equipment         General transfer comment: Pt required cues for hand  placement this session able to open hand once in standing to reach for hand grip.  Presents with R lateral lean.  Pt required assistance maintain standing.  Ambulation/Gait Ambulation/Gait assistance: Max assist;+2 physical assistance;+2 safety/equipment Gait Distance (Feet): 22 Feet Assistive device: Rolling walker (2 wheeled) Gait Pattern/deviations: Step-to pattern;Decreased stance time - right;Decreased dorsiflexion - right;Narrow base of support Gait velocity: decreased   General Gait Details: Pt required assistance for advancing RLE forward and keeping pathway of RW.  Pt continues to require external assistance to maintain balance.  He fatigues quickly with activity.  He continues to require assistance to manually advance RLE forward while in brace.   Stairs             Wheelchair Mobility    Modified Rankin (Stroke Patients Only) Modified Rankin (Stroke Patients Only) Pre-Morbid Rankin Score: No symptoms Modified Rankin: Moderately severe disability     Balance Overall balance assessment: Needs assistance Sitting-balance support: Feet supported;Single extremity supported Sitting balance-Leahy Scale: Fair Sitting balance - Comments: maintain sitting EOB without assist Postural control: Right lateral lean;Posterior lean Standing balance support: Bilateral upper extremity supported;During functional activity Standing balance-Leahy Scale: Poor                              Cognition Arousal/Alertness: Awake/alert Behavior During Therapy: WFL for tasks assessed/performed Overall Cognitive Status: Difficult to assess  General Comments: pt verbalizing a few one word answers to questions and stating name. Follows about 50% of one step commands      Exercises      General Comments        Pertinent Vitals/Pain Pain Assessment: No/denies pain Faces Pain Scale: No hurt    Home Living                       Prior Function            PT Goals (current goals can now be found in the care plan section) Acute Rehab PT Goals Patient Stated Goal: unable to state Potential to Achieve Goals: Fair Progress towards PT goals: Progressing toward goals    Frequency    Min 3X/week      PT Plan Current plan remains appropriate    Co-evaluation PT/OT/SLP Co-Evaluation/Treatment: Yes Reason for Co-Treatment: Complexity of the patient's impairments (multi-system involvement) PT goals addressed during session: Mobility/safety with mobility        AM-PAC PT "6 Clicks" Mobility   Outcome Measure  Help needed turning from your back to your side while in a flat bed without using bedrails?: A Lot Help needed moving from lying on your back to sitting on the side of a flat bed without using bedrails?: A Lot Help needed moving to and from a bed to a chair (including a wheelchair)?: A Lot Help needed standing up from a chair using your arms (e.g., wheelchair or bedside chair)?: A Lot Help needed to walk in hospital room?: Total Help needed climbing 3-5 steps with a railing? : Total 6 Click Score: 10    End of Session Equipment Utilized During Treatment: Gait belt;Right knee immobilizer Activity Tolerance: Patient tolerated treatment well Patient left: with chair alarm set;in chair;with call bell/phone within reach Nurse Communication: Mobility status PT Visit Diagnosis: Other abnormalities of gait and mobility (R26.89);Hemiplegia and hemiparesis;Muscle weakness (generalized) (M62.81);Difficulty in walking, not elsewhere classified (R26.2);Unsteadiness on feet (R26.81) Hemiplegia - Right/Left: Right Hemiplegia - caused by: Cerebral infarction     Time: 1694-5038 PT Time Calculation (min) (ACUTE ONLY): 29 min  Charges:  $Gait Training: 8-22 mins                     Erasmo Leventhal , PTA Acute Rehabilitation Services Pager [phone] Office [phone]     Derek Huneycutt [name] 05/20/2019,  11:13 AM

## 2019-05-20 NOTE — Progress Notes (Signed)
Occupational Therapy Treatment Patient Details Name: Scott Gilbert MRN: [id number] DOB: [birth year] Today's Date: 05/20/2019    History of present illness 86 y.o. male with past medical history significant for hypertension and mild dementia. He presented to the emergency department with sudden onset of slurred speech and aphasia, facial droop and weakness.   CT cerebral perfusion on 04/16/19 reported: "acute distal Left ACA territory ischemia and infarct."   OT comments  Patient continues to make steady progress towards goals in skilled OT session. Patient's session encompassed co-treat with PT in order to encompass functional mobility to complete ADL tasks and therapeutic exercise with RUE. Pt is slowly gaining increased control with RUE, now able to extend fingers minimally however requires assistance for composite flexion. Pt limited by movement at thumb for opposition and can grip (key pinch) when objects are placed, however requires min/mod A to release or use of pt's left hand to release. Pt continues to show improvements and would greatly benefit from CIR placement to further develop skills. Will follow acutely.    Follow Up Recommendations  CIR;Supervision/Assistance - 24 hour    Equipment Recommendations  Other (comment)(defer to next level of care)    Recommendations for Other Services      Precautions / Restrictions Precautions Precautions: Fall Precaution Comments: Utilized KI for gt training and ace wrapped foot into dorsiflexion. Required Braces or Orthoses: Knee Immobilizer - Right Knee Immobilizer - Right: On when out of bed or walking Restrictions Weight Bearing Restrictions: No       Mobility Bed Mobility Overal bed mobility: Needs Assistance Bed Mobility: Supine to Sit;Sit to Supine Rolling: Mod assist;+2 for physical assistance Sidelying to sit: Mod assist;+2 for physical assistance       General bed mobility comments: Pt required assistance to move to edge  of bed this session.  He attempted to use R hand for function but unable to let go of rail once finished pulling on it.  Pt required moderate assistance to elevate trunk into standing.  Transfers Overall transfer level: Needs assistance Equipment used: Rolling walker (2 wheeled) Transfers: Sit to/from Stand Sit to Stand: Mod assist;+2 safety/equipment         General transfer comment: Pt required cues for hand placement this session able to open hand once in standing to reach for hand grip.  Presents with R lateral lean.  Pt required assistance maintain standing.    Balance Overall balance assessment: Needs assistance Sitting-balance support: Feet supported;Single extremity supported Sitting balance-Leahy Scale: Fair Sitting balance - Comments: maintain sitting EOB without assist Postural control: Right lateral lean;Posterior lean Standing balance support: Bilateral upper extremity supported;During functional activity Standing balance-Leahy Scale: Poor                             ADL either performed or assessed with clinical judgement   ADL                                               Vision       Perception     Praxis      Cognition Arousal/Alertness: Awake/alert Behavior During Therapy: WFL for tasks assessed/performed Overall Cognitive Status: Difficult to assess  General Comments: pt verbalizing a few one word answers to questions and stating name. Follows about 50% of one step commands        Exercises Hand Exercises Digit Composite Flexion: PROM;Right;AROM;10 reps Composite Extension: PROM;AROM;Right;10 reps Digit Composite Abduction: PROM;AROM;Right;10 reps Thumb Abduction: PROM;10 reps;Right Opposition: PROM;10 reps;Right   Shoulder Instructions       General Comments      Pertinent Vitals/ Pain       Pain Assessment: No/denies pain Faces Pain Scale: No hurt  Home  Living                                          Prior Functioning/Environment              Frequency  Min 2X/week        Progress Toward Goals  OT Goals(current goals can now be found in the care plan section)  Progress towards OT goals: Progressing toward goals  Acute Rehab OT Goals Patient Stated Goal: unable to state OT Goal Formulation: Patient unable to participate in goal setting Time For Goal Achievement: 05/20/19 Potential to Achieve Goals: Good  Plan Discharge plan remains appropriate    Co-evaluation    PT/OT/SLP Co-Evaluation/Treatment: Yes Reason for Co-Treatment: Complexity of the patient's impairments (multi-system involvement) PT goals addressed during session: Mobility/safety with mobility OT goals addressed during session: ADL's and self-care;Strengthening/ROM      AM-PAC OT "6 Clicks" Daily Activity     Outcome Measure   Help from another person eating meals?: A Lot Help from another person taking care of personal grooming?: A Little Help from another person toileting, which includes using toliet, bedpan, or urinal?: A Lot Help from another person bathing (including washing, rinsing, drying)?: A Lot Help from another person to put on and taking off regular upper body clothing?: A Lot Help from another person to put on and taking off regular lower body clothing?: A Lot 6 Click Score: 13    End of Session Equipment Utilized During Treatment: Gait belt;Rolling walker  OT Visit Diagnosis: Unsteadiness on feet (R26.81);Muscle weakness (generalized) (M62.81)   Activity Tolerance Patient tolerated treatment well   Patient Left in chair;with call bell/phone within reach;with chair alarm set   Nurse Communication Mobility status        Time: 3235-5732 OT Time Calculation (min): 34 min  Charges: OT General Charges $OT Visit: 1 Visit OT Treatments $Therapeutic Exercise: 8-22 mins  Corinne Ports E. Hilaria Titsworth, COTA/L Acute  Rehabilitation Services [phone] [place] 05/20/2019, 12:26 PM

## 2019-05-21 LAB — CBC
HCT: 36.3 % — ABNORMAL LOW (ref 39.0–52.0)
Hemoglobin: 12.5 g/dL — ABNORMAL LOW (ref 13.0–17.0)
MCH: 31.5 pg (ref 26.0–34.0)
MCHC: 34.4 g/dL (ref 30.0–36.0)
MCV: 91.4 fL (ref 80.0–100.0)
Platelets: 250 10*3/uL (ref 150–400)
RBC: 3.97 MIL/uL — ABNORMAL LOW (ref 4.22–5.81)
RDW: 12.3 % (ref 11.5–15.5)
WBC: 8 10*3/uL (ref 4.0–10.5)
nRBC: 0 % (ref 0.0–0.2)

## 2019-05-21 LAB — BASIC METABOLIC PANEL
Anion gap: 12 (ref 5–15)
BUN: 49 mg/dL — ABNORMAL HIGH (ref 8–23)
CO2: 21 mmol/L — ABNORMAL LOW (ref 22–32)
Calcium: 8.8 mg/dL — ABNORMAL LOW (ref 8.9–10.3)
Chloride: 97 mmol/L — ABNORMAL LOW (ref 98–111)
Creatinine, Ser: 2.05 mg/dL — ABNORMAL HIGH (ref 0.61–1.24)
GFR calc Af Amer: 33 mL/min — ABNORMAL LOW (ref >60)
GFR calc non Af Amer: 28 mL/min — ABNORMAL LOW (ref >60)
Glucose, Bld: 101 mg/dL — ABNORMAL HIGH (ref 70–99)
Potassium: 4.4 mmol/L (ref 3.5–5.1)
Sodium: 130 mmol/L — ABNORMAL LOW (ref 135–145)

## 2019-05-21 MED ORDER — SODIUM CHLORIDE 0.9 % IV SOLN: INTRAVENOUS | Status: DC

## 2019-05-21 MED ORDER — SODIUM CHLORIDE 0.9 % IV BOLUS
500.0000 mL | Freq: Once | INTRAVENOUS | Status: AC
  Administered 2019-05-21: 10:00:00 500 mL via INTRAVENOUS

## 2019-05-21 NOTE — Plan of Care (Signed)
  Problem: Activity: Goal: Risk for activity intolerance will decrease Outcome: Progressing   Problem: Nutrition: Goal: Adequate nutrition will be maintained Outcome: Progressing   Problem: Coping: Goal: Level of anxiety will decrease Outcome: Progressing   Problem: Pain Managment: Goal: General experience of comfort will improve Outcome: Progressing   

## 2019-05-21 NOTE — Progress Notes (Signed)
STROKE TEAM PROGRESS NOTE   INTERVAL HISTORY Just up in chair after working with therapy. Doing well. Will name objects and follow 1-step commands. No changes.  OBJECTIVE Vitals:   05/21/19 0027 05/21/19 0448 05/21/19 0950 05/21/19 1135  BP: (!) 128/54 (!) 145/61 (!) 151/65 (!) 150/76  Pulse: 78 79 92 91  Resp: 16 19  20   Temp: 98.2 F (36.8 C) 97.6 F (36.4 C)  98.1 F (36.7 C)  TempSrc: Oral Axillary  Oral  SpO2: 97% 99%  100%  Weight:       CBC:  Recent Labs  Lab 05/21/19 0342  WBC 8.0  HGB 12.5*  HCT 36.3*  MCV 91.4  PLT 250   Basic Metabolic Panel:  Recent Labs  Lab 05/21/19 0342  NA 130*  K 4.4  CL 97*  CO2 21*  GLUCOSE 101*  BUN 49*  CREATININE 2.05*  CALCIUM 8.8*    IMAGING past 24h No results found.   PHYSICAL EXAM         General - Well nourished, well developed elderly caucasian male, in no apparent distress.    Ophthalmologic - fundi not visualized due to noncooperation.  Cardiovascular - Regular rhythm and rate.  Neuro -  , awake alert, making eye contact, tracking bilaterally. Global aphasia, will follow occasional commands on the L. Able to laugh, and will name objects including self. ay. No gaze preference, blinking to visual threat bilaterally. Right facial droop.  Tongue midline.  Left upper extremity 4/5, left lower extremity at least 3-/5.  Right upper extremity 3/5 with significantly increased muscle tone, and right lower extremity mild withdrawal on pain stimulation 2+/5.  No Babinski. Sensation, coordination and gait not tested. Global bradykinesia and rigidity.    ASSESSMENT/PLAN [name] is a 86 y.o. male with history of hypertension and  mild dementia (medical care at [facility]), presents to the emergency department with sudden onset of slurred speech, aphasia, left gaze preference with right-sided neglect, right hemiparesis, facial droop and weakness. He received IV t-PA Friday 04/16/19 at 2230.  Stroke: Left ACA territory  infarct, likely due to large vessel disease although cardioembolic source not excluded  Code Stroke CT Head - No acute intracranial abnormality, multiple remote lacunar infarcts involving the bilateral basal ganglia, with chronic left cerebellar infarcts.   MRI head - Moderate-sized acute ischemic nonhemorrhagic left ACA territory infarct  CTA H&N - severe distal Left ICA siphon stenosis due to bulky calcified plaque, multifocal Left MCA irregularity and stenoses, up to severe stenoses of both distal VAs, and the Left PCA. bilateral MCA M3, and Left ACA A2 severe stenoses.   EEG - cortical dysfunction in left frontotemporal region, no seizures.  2D Echo - EF 70-75%. No intracardiac source of embolism   LE venous doppler  Negative for DVT   Consider 30 day cardiac event monitoring as outpt vs inpt loop placement (more cost effective)  04/18/19 Virus 2 - negative  LDL - 113  HgbA1c - 5.3  UDS - negative  VTE prophylaxis - lovenox  No antithrombotic prior to admission, now on ASA 325 and plavix DAPT for 3 months and then ASA alone given significant intracranial stenosis  Palliative consult 04/23/19 - Full Code - plan OP referral to Hospice. Continue to follow pt as an IP, routine attempts to contact family.  Therapy recommendations: CIR->SNF   Disposition:  Pending - pt without insurance coverage - SW continues to be involved without change in status   Medically  ready for d/c once d/c plan determined.   Hypertension  Home BP meds: Zestril 2.5  On lisinopril to 10 -> 20 mg  On norvasc 5 -> 10 mg  BP stable 130-150s . BP goal normotensive . Monitor   Hyperlipidemia  Home Lipid lowering medication: none  LDL 113, goal < 70  Current lipid lowering medication: on lipitor 40  Continue statin at discharge  Dysphagia . Secondary to stroke . D1 thin liquids->D2 thin liquids . Speech on board . Good po intake reported by RN . Off IV fluid yesterday . Repeat labs  Friday  Mild tachycardia and tachypnea, resolved  afebrile  WBC 6.4  UA negative 2/10, UA repeat unremarkable  CXR NAD 2/10, repeat 04/30/19 - neg.  Other Stroke Risk Factors  Advanced age  Hx stroke/TIA - by imaging  Other Active Problems  AKI on CKD - stage 3a, Cre 1.24 POs encouraged   Dementia - on galantamine and Risperdal   Fall - right knee bruise  Attempt trial of sinemet 2/13>>2/18 for possible vascular parkinsonism without improvement, so stopped  Hyperkalemia 4.2 resolved  Hospital day # 35  .  Patient is difficult placement to skilled nursing facility but is medically stable if bed becomes available  Antony Contras, MD  To contact Stroke Continuity provider, please refer to [email or web address] After hours, contact General Neurology

## 2019-05-22 NOTE — Plan of Care (Signed)
  Problem: Clinical Measurements: Goal: Respiratory complications will improve Outcome: Progressing   Problem: Activity: Goal: Risk for activity intolerance will decrease Outcome: Progressing   Problem: Nutrition: Goal: Adequate nutrition will be maintained Outcome: Progressing   

## 2019-05-22 NOTE — Progress Notes (Signed)
STROKE TEAM PROGRESS NOTE   INTERVAL HISTORY He is  up in chair being fed by his nurse. Doing well. Will name objects and follow 1-step commands. No changes.  OBJECTIVE Vitals:   05/21/19 1943 05/21/19 2358 05/22/19 0420 05/22/19 0844  BP: (!) 125/44 (!) 127/58 133/60 (!) 158/59  Pulse: 83 88 90 90  Resp: 16 18 18 20   Temp: 98.9 F (37.2 C) 98.5 F (36.9 C) 98.3 F (36.8 C) 98.1 F (36.7 C)  TempSrc: Oral Oral Oral Oral  SpO2: 98% 99% 99% 96%  Weight:       CBC:  Recent Labs  Lab 05/21/19 0342  WBC 8.0  HGB 12.5*  HCT 36.3*  MCV 91.4  PLT 427   Basic Metabolic Panel:  Recent Labs  Lab 05/21/19 0342  NA 130*  K 4.4  CL 97*  CO2 21*  GLUCOSE 101*  BUN 49*  CREATININE 2.05*  CALCIUM 8.8*    IMAGING past 24h No results found.   PHYSICAL EXAM         General - Well nourished, well developed elderly caucasian male, in no apparent distress.    Ophthalmologic - fundi not visualized due to noncooperation.  Cardiovascular - Regular rhythm and rate.  Neuro -  , awake alert, making eye contact, tracking bilaterally. Global aphasia, will follow occasional commands on the L. Able to laugh, and will name objects including self. ay. No gaze preference, blinking to visual threat bilaterally. Right facial droop.  Tongue midline.  Left upper extremity 4/5, left lower extremity at least 3-/5.  Right upper extremity 3/5 with significantly increased muscle tone, and right lower extremity mild withdrawal on pain stimulation 2+/5.  No Babinski. Sensation, coordination and gait not tested. Global bradykinesia and rigidity.    ASSESSMENT/PLAN Scott Gilbert is a 86 y.o. male with history of hypertension and  mild dementia (medical care at [facility]), presents to the emergency department with sudden onset of slurred speech, aphasia, left gaze preference with right-sided neglect, right hemiparesis, facial droop and weakness. He received IV t-PA Friday 04/16/19 at 2230.  Stroke: Left  ACA territory infarct, likely due to large vessel disease although cardioembolic source not excluded  Code Stroke CT Head - No acute intracranial abnormality, multiple remote lacunar infarcts involving the bilateral basal ganglia, with chronic left cerebellar infarcts.   MRI head - Moderate-sized acute ischemic nonhemorrhagic left ACA territory infarct  CTA H&N - severe distal Left ICA siphon stenosis due to bulky calcified plaque, multifocal Left MCA irregularity and stenoses, up to severe stenoses of both distal VAs, and the Left PCA. bilateral MCA M3, and Left ACA A2 severe stenoses.   EEG - cortical dysfunction in left frontotemporal region, no seizures.  2D Echo - EF 70-75%. No intracardiac source of embolism   LE venous doppler  Negative for DVT   Consider 30 day cardiac event monitoring as outpt vs inpt loop placement (more cost effective)  [employer] Virus 2 - negative  LDL - 113  HgbA1c - 5.3  UDS - negative  VTE prophylaxis - lovenox  No antithrombotic prior to admission, now on ASA 325 and plavix DAPT for 3 months and then ASA alone given significant intracranial stenosis  Palliative consult 04/23/19 - Full Code - plan OP referral to Hospice. Continue to follow pt as an IP, routine attempts to contact family.  Therapy recommendations: CIR->SNF   Disposition:  Pending - pt without insurance coverage - SW continues to be involved without change  in status   Medically ready for d/c once d/c plan determined.   Hypertension  Home BP meds: Zestril 2.5  On lisinopril to 10 -> 20 mg  On norvasc 5 -> 10 mg  SBP stable 120-150s . BP goal normotensive . Monitor   Hyperlipidemia  Home Lipid lowering medication: none  LDL 113, goal < 70  Current lipid lowering medication: on lipitor 40  Continue statin at discharge  Dysphagia . Secondary to stroke . D1 thin liquids->D2 thin liquids . Speech on board . Good po intake reported by RN . Off IV fluid  yesterday . Repeat labs Friday  Mild tachycardia and tachypnea, resolved  afebrile  WBC 6.4->8.0  UA negative 2/10, UA repeat unremarkable  CXR NAD 2/10, repeat 04/30/19 - neg.  Other Stroke Risk Factors  Advanced age  Hx stroke/TIA - by imaging  Other Active Problems  AKI on CKD - stage 3a, Cre 1.24 POs encouraged ->2.05-> NS @ 75 cc/hr started Friday 05/21/2019 - recheck Bmet Monday 05/24/2019  Dementia - on galantamine and Risperdal   Fall - right knee bruise  Attempt trial of sinemet 2/13>>2/18 for possible vascular parkinsonism without improvement, so stopped  Hyperkalemia 4.2 resolved->4.4  Hyponatremia - 130 - recheck Monday   Hospital day # 36  Patient is difficult placement to skilled nursing facility but is medically stable if bed becomes available  [name], MD  To contact Stroke Continuity provider, please refer to [email or web address]. After hours, contact General Neurology

## 2019-05-23 LAB — BASIC METABOLIC PANEL
Anion gap: 8 (ref 5–15)
BUN: 22 mg/dL (ref 8–23)
CO2: 24 mmol/L (ref 22–32)
Calcium: 8.8 mg/dL — ABNORMAL LOW (ref 8.9–10.3)
Chloride: 101 mmol/L (ref 98–111)
Creatinine, Ser: 1.13 mg/dL (ref 0.61–1.24)
GFR calc Af Amer: >60 mL/min (ref >60)
GFR calc non Af Amer: 58 mL/min — ABNORMAL LOW (ref >60)
Glucose, Bld: 107 mg/dL — ABNORMAL HIGH (ref 70–99)
Potassium: 4.4 mmol/L (ref 3.5–5.1)
Sodium: 133 mmol/L — ABNORMAL LOW (ref 135–145)

## 2019-05-23 LAB — CBC
HCT: 36.7 % — ABNORMAL LOW (ref 39.0–52.0)
Hemoglobin: 12.5 g/dL — ABNORMAL LOW (ref 13.0–17.0)
MCH: 31.8 pg (ref 26.0–34.0)
MCHC: 34.1 g/dL (ref 30.0–36.0)
MCV: 93.4 fL (ref 80.0–100.0)
Platelets: 236 10*3/uL (ref 150–400)
RBC: 3.93 MIL/uL — ABNORMAL LOW (ref 4.22–5.81)
RDW: 12.3 % (ref 11.5–15.5)
WBC: 7.3 10*3/uL (ref 4.0–10.5)
nRBC: 0 % (ref 0.0–0.2)

## 2019-05-23 NOTE — Plan of Care (Signed)
  Problem: Nutrition: Goal: Adequate nutrition will be maintained Outcome: Progressing   Problem: Pain Managment: Goal: General experience of comfort will improve Outcome: Progressing   

## 2019-05-23 NOTE — Progress Notes (Signed)
STROKE TEAM PROGRESS NOTE   INTERVAL HISTORY He is  up in chair being fed by his nurse. Doing well. Will name objects and follow 1-step commands. No changes.  He is eating his breakfast.  Lab work from today BMP and CBC are both normal.  He is on IV normal saline at 75 cc an hour  OBJECTIVE Vitals:   05/22/19 1552 05/22/19 2028 05/23/19 0000 05/23/19 0339  BP: (!) 159/62 132/65 (!) 151/55 (!) 131/58  Pulse: (!) 110 94 88 90  Resp: 18 18 16 18   Temp: 98.2 F (36.8 C) 98.8 F (37.1 C) 98 F (36.7 C) 98.5 F (36.9 C)  TempSrc: Oral Oral Oral Oral  SpO2: 97% 96% 99% 97%  Weight:       CBC:  Recent Labs  Lab 05/21/19 0342  WBC 8.0  HGB 12.5*  HCT 36.3*  MCV 91.4  PLT 250   Basic Metabolic Panel:  Recent Labs  Lab 05/21/19 0342  NA 130*  K 4.4  CL 97*  CO2 21*  GLUCOSE 101*  BUN 49*  CREATININE 2.05*  CALCIUM 8.8*    IMAGING past 24h No results found.   PHYSICAL EXAM         General - Well nourished, well developed elderly caucasian male, in no apparent distress.    Ophthalmologic - fundi not visualized due to noncooperation.  Cardiovascular - Regular rhythm and rate.  Neuro -  , awake alert, making eye contact, tracking bilaterally. Global aphasia, will follow occasional commands on the L. Able to laugh, and will name objects including self. ay. No gaze preference, blinking to visual threat bilaterally. Right facial droop.  Tongue midline.  Left upper extremity 4/5, left lower extremity at least 3-/5.  Right upper extremity 3/5 with significantly increased muscle tone, and right lower extremity mild withdrawal on pain stimulation 2+/5.  No Babinski. Sensation, coordination and gait not tested. Global bradykinesia and rigidity.    ASSESSMENT/PLAN [name] is a 86 y.o. male with history of hypertension and  mild dementia (medical care at [facility]), presents to the emergency department with sudden onset of slurred speech, aphasia, left gaze preference with  right-sided neglect, right hemiparesis, facial droop and weakness. He received IV t-PA Friday 04/16/19 at 2230.  Stroke: Left ACA territory infarct, likely due to large vessel disease although cardioembolic source not excluded  Code Stroke CT Head - No acute intracranial abnormality, multiple remote lacunar infarcts involving the bilateral basal ganglia, with chronic left cerebellar infarcts.   MRI head - Moderate-sized acute ischemic nonhemorrhagic left ACA territory infarct  CTA H&N - severe distal Left ICA siphon stenosis due to bulky calcified plaque, multifocal Left MCA irregularity and stenoses, up to severe stenoses of both distal VAs, and the Left PCA. bilateral MCA M3, and Left ACA A2 severe stenoses.   EEG - cortical dysfunction in left frontotemporal region, no seizures.  2D Echo - EF 70-75%. No intracardiac source of embolism   LE venous doppler  Negative for DVT   Consider 30 day cardiac event monitoring as outpt vs inpt loop placement (more cost effective)  04/18/19 Virus 2 - negative  LDL - 113  HgbA1c - 5.3  UDS - negative  VTE prophylaxis - lovenox  No antithrombotic prior to admission, now on ASA 325 and plavix DAPT for 3 months and then ASA alone given significant intracranial stenosis  Palliative consult 04/23/19 - Full Code - plan OP referral to Hospice. Continue to follow pt  as an IP, routine attempts to contact family.  Therapy recommendations: CIR->SNF   Disposition:  Pending - pt without insurance coverage - SW continues to be involved without change in status   Medically ready for d/c once d/c plan determined.   Hypertension  Home BP meds: Zestril 2.5  On lisinopril to 10 -> 20 mg  On norvasc 5 -> 10 mg  SBP stable 120-150s . BP goal normotensive . Monitor   Hyperlipidemia  Home Lipid lowering medication: none  LDL 113, goal < 70  Current lipid lowering medication: on lipitor 40  Continue statin at discharge  Dysphagia . Secondary  to stroke . D1 thin liquids->D2 thin liquids . Speech on board . Good po intake reported by RN . Off IV fluid yesterday . Repeat labs Friday  Mild tachycardia and tachypnea, resolved  afebrile  WBC 6.4->8.0  UA negative 2/10, UA repeat unremarkable  CXR NAD 2/10, repeat 04/30/19 - neg.  Other Stroke Risk Factors  Advanced age  Hx stroke/TIA - by imaging  Other Active Problems  AKI on CKD - stage 3a, Cre 1.24 POs encouraged ->2.05-> NS @ 75 cc/hr started Friday 05/21/2019 - recheck Bmet Monday 05/24/2019 normal and IV fluids diminished to 50 cc an hour  Dementia - on galantamine and Risperdal   Fall - right knee bruise  Attempt trial of sinemet 2/13>>2/18 for possible vascular parkinsonism without improvement, so stopped  Hyperkalemia 4.2 resolved->4.4  Hyponatremia - 130 - recheck Monday   Hospital day # 37  Plan decrease IV fluids to 50 cc an hour.  Patient is difficult placement to skilled nursing facility but is medically stable if bed becomes available Antony Contras, MD   To contact Stroke Continuity provider, please refer to [email or web address] After hours, contact General Neurology

## 2019-05-24 DIAGNOSIS — E876 Hypokalemia: Secondary | ICD-10-CM

## 2019-05-24 LAB — BASIC METABOLIC PANEL
Anion gap: 9 (ref 5–15)
BUN: 22 mg/dL (ref 8–23)
CO2: 25 mmol/L (ref 22–32)
Calcium: 9 mg/dL (ref 8.9–10.3)
Chloride: 100 mmol/L (ref 98–111)
Creatinine, Ser: 1.3 mg/dL — ABNORMAL HIGH (ref 0.61–1.24)
GFR calc Af Amer: 57 mL/min — ABNORMAL LOW (ref >60)
GFR calc non Af Amer: 49 mL/min — ABNORMAL LOW (ref >60)
Glucose, Bld: 103 mg/dL — ABNORMAL HIGH (ref 70–99)
Potassium: 4.5 mmol/L (ref 3.5–5.1)
Sodium: 134 mmol/L — ABNORMAL LOW (ref 135–145)

## 2019-05-24 LAB — CBC
HCT: 36.1 % — ABNORMAL LOW (ref 39.0–52.0)
Hemoglobin: 12.4 g/dL — ABNORMAL LOW (ref 13.0–17.0)
MCH: 31.7 pg (ref 26.0–34.0)
MCHC: 34.3 g/dL (ref 30.0–36.0)
MCV: 92.3 fL (ref 80.0–100.0)
Platelets: 235 10*3/uL (ref 150–400)
RBC: 3.91 MIL/uL — ABNORMAL LOW (ref 4.22–5.81)
RDW: 12.3 % (ref 11.5–15.5)
WBC: 6.6 10*3/uL (ref 4.0–10.5)
nRBC: 0 % (ref 0.0–0.2)

## 2019-05-24 NOTE — Progress Notes (Signed)
Physical Therapy Treatment Patient Details Name: Scott Gilbert MRN: [id number] DOB: Apr 04, 1929 Today's Date: 05/24/2019    History of Present Illness 86 y.o. male with past medical history significant for hypertension and mild dementia. He presented to the emergency department with sudden onset of slurred speech and aphasia, facial droop and weakness.   CT cerebral perfusion on 04/16/19 reported: "acute distal Left ACA territory ischemia and infarct."    PT Comments    Pt required continued assistance to ambulate in halls.  He is progressing distance and tolerating increased activity.  He continues to require manual assistance to advance RLE and facilitate quad control in stance phase.  Continue to use R knee immobilizer.  Pt has been denied CIR and plan now is for SNF placement.  Will inform supervising PT to update recommendations at this time.      Follow Up Recommendations  Supervision/Assistance - 24 hour;SNF     Equipment Recommendations  Wheelchair (measurements PT)    Recommendations for Other Services       Precautions / Restrictions Precautions Precautions: Fall Precaution Comments: Utilized KI for gt training and ace wrapped foot into dorsiflexion. Required Braces or Orthoses: Knee Immobilizer - Right Knee Immobilizer - Right: On when out of bed or walking(for therapy use) Restrictions Weight Bearing Restrictions: No    Mobility  Bed Mobility Overal bed mobility: Needs Assistance Bed Mobility: Supine to Sit     Supine to sit: Mod assist;+2 for safety/equipment     General bed mobility comments: Pt required assistance to advance LEs to edge of bed and elevate trunk into a seated position.  He presents with posterior LOB and require min assistance to maintain sitting edge of bed.  Transfers Overall transfer level: Needs assistance Equipment used: Rolling walker (2 wheeled) Transfers: Sit to/from Stand Sit to Stand: Mod assist;+2 safety/equipment          General transfer comment: Pt required cues for hand placement and hand over hand on R to open hand and place on RW hand grip.  PTA also assisted with removing R hand from RW x2.  Poor eccentric load returning to seated surface.  Ambulation/Gait Ambulation/Gait assistance: Max assist;+2 physical assistance Gait Distance (Feet): 30 Feet Assistive device: Rolling walker (2 wheeled) Gait Pattern/deviations: Step-to pattern;Decreased stance time - right;Decreased dorsiflexion - right;Narrow base of support Gait velocity: decreased   General Gait Details: Continued manual assistance of RLE forward.  Pt require several standing rest break to correct pathway of RW.  facilitation provided to R quad in stance phase.   Stairs             Wheelchair Mobility    Modified Rankin (Stroke Patients Only) Modified Rankin (Stroke Patients Only) Pre-Morbid Rankin Score: No symptoms Modified Rankin: Moderately severe disability     Balance Overall balance assessment: Needs assistance Sitting-balance support: Feet supported;Single extremity supported Sitting balance-Leahy Scale: Fair Sitting balance - Comments: maintain sitting EOB without assist     Standing balance-Leahy Scale: Poor                              Cognition Arousal/Alertness: Awake/alert Behavior During Therapy: WFL for tasks assessed/performed Overall Cognitive Status: Difficult to assess                                 General Comments: pt verbalizing a few one word answers to  questions and stating name. Follows about 50% of one step commands      Exercises      General Comments        Pertinent Vitals/Pain Pain Assessment: No/denies pain Faces Pain Scale: No hurt    Home Living                      Prior Function            PT Goals (current goals can now be found in the care plan section) Acute Rehab PT Goals Patient Stated Goal: unable to state PT Goal  Formulation: Patient unable to participate in goal setting Potential to Achieve Goals: Fair Progress towards PT goals: Progressing toward goals    Frequency    Min 3X/week      PT Plan Discharge plan needs to be updated    Co-evaluation              AM-PAC PT "6 Clicks" Mobility   Outcome Measure  Help needed turning from your back to your side while in a flat bed without using bedrails?: A Lot Help needed moving from lying on your back to sitting on the side of a flat bed without using bedrails?: A Lot Help needed moving to and from a bed to a chair (including a wheelchair)?: A Lot Help needed standing up from a chair using your arms (e.g., wheelchair or bedside chair)?: A Lot Help needed to walk in hospital room?: Total Help needed climbing 3-5 steps with a railing? : Total 6 Click Score: 10    End of Session Equipment Utilized During Treatment: Gait belt;Right knee immobilizer Activity Tolerance: Patient tolerated treatment well Patient left: with chair alarm set;in chair;with call bell/phone within reach(sitter alarm belt donned.) Nurse Communication: Mobility status PT Visit Diagnosis: Other abnormalities of gait and mobility (R26.89);Hemiplegia and hemiparesis;Muscle weakness (generalized) (M62.81);Difficulty in walking, not elsewhere classified (R26.2);Unsteadiness on feet (R26.81) Hemiplegia - Right/Left: Right Hemiplegia - caused by: Cerebral infarction     Time: 3532-9924 PT Time Calculation (min) (ACUTE ONLY): 30 min  Charges:  $Gait Training: 8-22 mins $Therapeutic Activity: 8-22 mins                     Erasmo Leventhal , PTA Acute Rehabilitation Services Pager [phone] Office [phone]     Scott Gilbert Scott Gilbert 05/24/2019, 5:31 PM

## 2019-05-24 NOTE — Progress Notes (Signed)
STROKE TEAM PROGRESS NOTE   INTERVAL HISTORY Patient lying in bed, make eye contact, smiling and makes sounds.  Able to tell me his name "Case", said his age wrong "86", however, no other meaningful words.  Right upper and lower extremity strength improved however quite spastic.  OBJECTIVE Vitals:   05/23/19 2319 05/24/19 0356 05/24/19 0803 05/24/19 1152  BP: (!) 148/77 131/77 (!) 155/77 (!) 153/60  Pulse: 90 85 85 89  Resp: 16 16 18 18   Temp: 97.9 F (36.6 C) 98 F (36.7 C) 98.3 F (36.8 C) 98.3 F (36.8 C)  TempSrc: Oral Oral Oral Oral  SpO2: 100% 100% 98% 98%  Weight:       CBC:  Recent Labs  Lab 05/23/19 0710 05/24/19 0419  WBC 7.3 6.6  HGB 12.5* 12.4*  HCT 36.7* 36.1*  MCV 93.4 92.3  PLT 236 235   Basic Metabolic Panel:  Recent Labs  Lab 05/23/19 0710 05/24/19 0419  NA 133* 134*  K 4.4 4.5  CL 101 100  CO2 24 25  GLUCOSE 107* 103*  BUN 22 22  CREATININE 1.13 1.30*  CALCIUM 8.8* 9.0    IMAGING past 24h No results found.   PHYSICAL EXAM  General - Well nourished, well developed elderly caucasian male, in no apparent distress.    Ophthalmologic - fundi not visualized due to noncooperation.  Cardiovascular - Regular rhythm and rate.  Neuro - awake alert, making eye contact, tracking bilaterally. Able to follow occasional commands on the L. Able to laugh, and able to say his name but wrong on age, but no other meaningful words. Gaze laterally, blinking to visual threat bilaterally. Right facial droop.  Tongue midline.  Left upper extremity 4/5, left lower extremity at least 3/5.  Right upper extremity 3/5 with significantly increased muscle tone, and right lower extremity withdrawal on pain stimulation 2+/5 with increased muscle tone.  No Babinski. Sensation, coordination and gait not tested. Global bradykinesia and rigidity.    ASSESSMENT/PLAN Scott Gilbert is a 86 y.o. male with history of hypertension and  mild dementia (medical care at [facility]),  presents to the emergency department with sudden onset of slurred speech, aphasia, left gaze preference with right-sided neglect, right hemiparesis, facial droop and weakness. He received IV t-PA Friday 04/16/19 at 2230.  Stroke: Left ACA territory infarct, likely due to large vessel disease although cardioembolic source not excluded  Code Stroke CT Head - No acute intracranial abnormality, multiple remote lacunar infarcts involving the bilateral basal ganglia, with chronic left cerebellar infarcts.   MRI head - Moderate-sized acute ischemic nonhemorrhagic left ACA territory infarct  CTA H&N - severe distal Left ICA siphon stenosis due to bulky calcified plaque, multifocal Left MCA irregularity and stenoses, up to severe stenoses of both distal VAs, and the Left PCA. bilateral MCA M3, and Left ACA A2 severe stenoses.   EEG - cortical dysfunction in left frontotemporal region, no seizures.  2D Echo - EF 70-75%. No intracardiac source of embolism   LE venous doppler  Negative for DVT   Consider 30 day cardiac event monitoring as outpt vs inpt loop placement on discharge (more cost effective)  LDL - 113  HgbA1c - 5.3  UDS - negative  VTE prophylaxis - lovenox  No antithrombotic prior to admission, now on ASA 325 and plavix DAPT for 3 months and then ASA alone given significant intracranial stenosis  Palliative consult 04/23/19 - Full Code - plan OP referral to Hospice. Continue to follow pt  as an IP, routine attempts to contact family.  Therapy recommendations: SNF   Disposition:  Pending - pt without insurance coverage - SW continues to be involved   Medically ready for d/c once d/c plan determined.   Hypertension  Home BP meds: Zestril 2.5  On lisinopril to 10 -> 20 mg  On norvasc 5 -> 10 mg  SBP stable 120-150s . BP goal normotensive  Hyperlipidemia  Home Lipid lowering medication: none  LDL 113, goal < 70  Current lipid lowering medication: on lipitor 40  Continue  statin at discharge  Dysphagia . Secondary to stroke . D1 thin liquids->D2 thin liquids . Speech on board . Encourage p.o. intake . On IVF at 50/h  AKI on CKD 3a   Cre 1.24 ->2.05-> NS @ 75 cc/hr 05/21/2019 - 1.13-> decreased IVF to 50cc/hr ->1.30 ->continue IVF  Mild tachycardia and tachypnea, resolved  Afebrile   WBC normal  UA negative 2/10, UA repeat unremarkable  CXR NAD 2/10, repeat 04/30/19 - neg.  Other Stroke Risk Factors  Advanced age  Hx stroke/TIA - by imaging  Other Active Problems  Dementia - on galantamine and Risperdal   Fall - right knee bruise, resolved  Attempt trial of sinemet 2/13>>2/18 for possible vascular parkinsonism without improvement, stopped  Hyperkalemia 4.5 resolved  Hyponatremia - 133->134  Hospital day # 68  [name], MD PhD Stroke Neurology 05/24/2019 7:56 PM   To contact Stroke Continuity provider, please refer to [email or web address] After hours, contact General Neurology

## 2019-05-25 NOTE — Progress Notes (Signed)
Physical Therapy Treatment Patient Details Name: Scott Gilbert MRN: [id number] DOB: 05-24-1929 Today's Date: 05/25/2019    History of Present Illness 86 y.o. male with past medical history significant for hypertension and mild dementia. He presented to the emergency department with sudden onset of slurred speech and aphasia, facial droop and weakness.   CT cerebral perfusion on 04/16/19 reported: "acute distal Left ACA territory ischemia and infarct."    PT Comments    Patient progressing with ambulation distance and able to perform with +1 assist, but needing support for safety with chair follow.  Continues to be appropriate for SNF level rehab.  PT to follow acutely.     Follow Up Recommendations  Supervision/Assistance - 24 hour;SNF     Equipment Recommendations  Wheelchair (measurements PT)    Recommendations for Other Services       Precautions / Restrictions Precautions Precautions: Fall Precaution Comments: Utilized KI for gt training and ace wrapped foot into dorsiflexion. Required Braces or Orthoses: Knee Immobilizer - Right;Other Brace Knee Immobilizer - Right: On when out of bed or walking Other Brace: R UE resting hand splint in room for use at night    Mobility  Bed Mobility Overal bed mobility: Needs Assistance Bed Mobility: Supine to Sit Rolling: Mod assist;+2 for physical assistance   Supine to sit: Mod assist;+2 for safety/equipment     General bed mobility comments: pt requires (A) to elevate trunk from bed surface. pt requires (A) at EOB to sustain static sitting  Transfers Overall transfer level: Needs assistance Equipment used: Rolling walker (2 wheeled) Transfers: Sit to/from Stand Sit to Stand: Mod assist;+2 safety/equipment         General transfer comment: pulled up on RW hinging with R leg in immobilizer on EOB, +1 max A to stand  Ambulation/Gait Ambulation/Gait assistance: Mod assist;+2 safety/equipment Gait Distance (Feet): 50  Feet Assistive device: Rolling walker (2 wheeled) Gait Pattern/deviations: Step-to pattern;Decreased stance time - right;Decreased dorsiflexion - right;Shuffle;Drifts right/left;Wide base of support     General Gait Details: OT assist wtih IV pole and chair follow; PT assist for balance, walker management, R LE progression, weight shift  and hip internal rotation with KI and ace on R ankle   Stairs             Wheelchair Mobility    Modified Rankin (Stroke Patients Only) Modified Rankin (Stroke Patients Only) Pre-Morbid Rankin Score: No symptoms Modified Rankin: Moderately severe disability     Balance Overall balance assessment: Needs assistance Sitting-balance support: Single extremity supported;Feet supported Sitting balance-Leahy Scale: Fair     Standing balance support: Bilateral upper extremity supported;During functional activity Standing balance-Leahy Scale: Poor Standing balance comment: UE support and assist for balance                            Cognition Arousal/Alertness: Awake/alert Behavior During Therapy: WFL for tasks assessed/performed Overall Cognitive Status: Difficult to assess                                 General Comments: accurately reports color of 3 flowers in the room. pt giggles in response to therapists      Exercises Other Exercises Other Exercises: PROM R hip stretch to ER's    General Comments General comments (skin integrity, edema, etc.): NT aware needing new condom cath, removed KI And ace wrap for skin protection and  floated R heel while in chair      Pertinent Vitals/Pain Pain Assessment: No/denies pain    Home Living                      Prior Function            PT Goals (current goals can now be found in the care plan section) Acute Rehab PT Goals Patient Stated Goal: unable to state Progress towards PT goals: Progressing toward goals    Frequency    Min 3X/week       PT Plan Current plan remains appropriate    Co-evaluation PT/OT/SLP Co-Evaluation/Treatment: Yes Reason for Co-Treatment: Complexity of the patient's impairments (multi-system involvement);For patient/therapist safety;To address functional/ADL transfers PT goals addressed during session: Mobility/safety with mobility;Balance;Proper use of DME OT goals addressed during session: ADL's and self-care;Proper use of Adaptive equipment and DME;Strengthening/ROM      AM-PAC PT "6 Clicks" Mobility   Outcome Measure  Help needed turning from your back to your side while in a flat bed without using bedrails?: A Lot Help needed moving from lying on your back to sitting on the side of a flat bed without using bedrails?: A Lot Help needed moving to and from a bed to a chair (including a wheelchair)?: A Lot Help needed standing up from a chair using your arms (e.g., wheelchair or bedside chair)?: A Lot Help needed to walk in hospital room?: A Lot Help needed climbing 3-5 steps with a railing? : Total 6 Click Score: 11    End of Session Equipment Utilized During Treatment: Gait belt;Right knee immobilizer Activity Tolerance: Patient tolerated treatment well Patient left: with chair alarm set;in chair;with call bell/phone within reach(alarm belt donned) Nurse Communication: Other (comment)(needing new condom cath) PT Visit Diagnosis: Other abnormalities of gait and mobility (R26.89);Hemiplegia and hemiparesis;Muscle weakness (generalized) (M62.81) Hemiplegia - Right/Left: Right Hemiplegia - dominant/non-dominant: Dominant Hemiplegia - caused by: Cerebral infarction     Time: 8841-6606 PT Time Calculation (min) (ACUTE ONLY): 29 min  Charges:  $Gait Training: 8-22 mins                     Sheran Lawless, PT Acute Rehabilitation Services [phone] 05/25/2019    Elray Mcgregor 05/25/2019, 3:40 PM

## 2019-05-25 NOTE — Discharge Summary (Addendum)
Stroke Discharge Summary  Patient ID: Scott Gilbert   MRN: [id number]      DOB: 11-10-1929  Date of Admission: 04/16/2019 Date of Discharge: 07/02/2019  Attending Physician:  [name], MD, Stroke MD Consultant(s):    Rehab team, Palliative Care team Patient's PCP:  [facility] [place] DIAGNOSIS:  Active Problems:   Stroke (cerebrum) (HCC)Left ACA territory infarct, likely due to large vessel disease   Palliative care by specialist   Goals of care, counseling/discussion   Muscular weakness   Xerostomia   Problem related to social environment   Spiritual distress   Allergies as of 07/02/2019   No Known Allergies     Medication List    STOP taking these medications   acyclovir 200 MG capsule Commonly known as: ZOVIRAX Replaced by: acyclovir 400 MG tablet   carboxymethylcellulose 0.5 % Soln Commonly known as: REFRESH PLUS Replaced by: polyvinyl alcohol 1.4 % ophthalmic solution   lisinopril 5 MG tablet Commonly known as: ZESTRIL   omeprazole 20 MG capsule Commonly known as: PRILOSEC   risperiDONE 1 MG/ML oral solution Commonly known as: RISPERDAL Replaced by: risperiDONE 0.5 MG tablet   Soothe Nighttime Oint     TAKE these medications    stroke: mapping our early stages of recovery book Misc 1 each by Does not apply route once for 1 dose.   acetaminophen 325 MG tablet Commonly known as: TYLENOL Take 2 tablets (650 mg total) by mouth every 4 (four) hours as needed for mild pain (or temp > 37.5 C (99.5 F)).   acyclovir 400 MG tablet Commonly known as: ZOVIRAX Take 1 tablet (400 mg total) by mouth 2 (two) times daily. Replaces: acyclovir 200 MG capsule   amLODipine 10 MG tablet Commonly known as: NORVASC Take 1 tablet (10 mg total) by mouth daily. Start taking on: July 03, 2019   aspirin 81 MG chewable tablet Chew 1 tablet (81 mg total) by mouth daily. Start taking on: July 03, 2019   atorvastatin 40 MG tablet Commonly known  as: LIPITOR Take 1 tablet (40 mg total) by mouth daily at 6 PM.   clopidogrel 75 MG tablet Commonly known as: PLAVIX Take 1 tablet (75 mg total) by mouth daily. Start taking on: July 03, 2019   enoxaparin 30 MG/0.3ML injection Commonly known as: LOVENOX Inject 0.3 mLs (30 mg total) into the skin daily. Start taking on: July 03, 2019   feeding supplement (ENSURE ENLIVE) Liqd Take 237 mLs by mouth 2 (two) times daily between meals.   galantamine 12 MG tablet Commonly known as: RAZADYNE Take 1 tablet (12 mg total) by mouth at bedtime. What changed:   medication strength  how much to take   labetalol 200 MG tablet Commonly known as: NORMODYNE Take 1 tablet (200 mg total) by mouth 3 (three) times daily.   multivitamin with minerals Tabs tablet Take 1 tablet by mouth daily. Start taking on: July 03, 2019   pantoprazole sodium 40 mg/20 mL Pack Commonly known as: PROTONIX Take 20 mLs (40 mg total) by mouth daily. Start taking on: July 03, 2019   polyvinyl alcohol 1.4 % ophthalmic solution Commonly known as: LIQUIFILM TEARS Place 1 drop into the right eye 4 (four) times daily as needed (dry eye). Replaces: carboxymethylcellulose 0.5 % Soln   risperiDONE 0.5 MG tablet Commonly known as: RISPERDAL Take 1 tablet (0.5 mg total) by mouth at bedtime. Replaces: risperiDONE 1 MG/ML oral solution   senna-docusate  8.6-50 MG tablet Commonly known as: Senokot-S Take 2 tablets by mouth 2 (two) times daily.       LABORATORY STUDIES CBC    Component Value Date/Time   WBC 6.6 05/24/2019 0419   RBC 3.91 (L) 05/24/2019 0419   HGB 12.4 (L) 05/24/2019 0419   HCT 36.1 (L) 05/24/2019 0419   PLT 235 05/24/2019 0419   MCV 92.3 05/24/2019 0419   MCH 31.7 05/24/2019 0419   MCHC 34.3 05/24/2019 0419   RDW 12.3 05/24/2019 0419   LYMPHSABS 1.3 04/16/2019 2219   MONOABS 0.5 04/16/2019 2219   EOSABS 0.2 04/16/2019 2219   BASOSABS 0.1 04/16/2019 2219   CMP    Component Value  Date/Time   NA 134 (L) 05/24/2019 0419   K 4.5 05/24/2019 0419   CL 100 05/24/2019 0419   CO2 25 05/24/2019 0419   GLUCOSE 103 (H) 05/24/2019 0419   BUN 22 05/24/2019 0419   CREATININE 1.30 (H) 05/24/2019 0419   CALCIUM 9.0 05/24/2019 0419   PROT 6.9 04/16/2019 2219   ALBUMIN 3.8 04/16/2019 2219   AST 29 04/16/2019 2219   ALT 25 04/16/2019 2219   ALKPHOS 100 04/16/2019 2219   BILITOT 0.5 04/16/2019 2219   GFRNONAA 49 (L) 05/24/2019 0419   GFRAA 57 (L) 05/24/2019 0419   COAGS Lab Results  Component Value Date   INR 1.1 04/16/2019   Lipid Panel    Component Value Date/Time   CHOL 167 04/17/2019 0300   TRIG 82 04/17/2019 0300   HDL 38 (L) 04/17/2019 0300   CHOLHDL 4.4 04/17/2019 0300   VLDL 16 04/17/2019 0300   LDLCALC 113 (H) 04/17/2019 0300   HgbA1C  Lab Results  Component Value Date   HGBA1C 5.3 04/17/2019   Urinalysis    Component Value Date/Time   COLORURINE YELLOW 04/30/2019 1815   APPEARANCEUR CLEAR 04/30/2019 1815   LABSPEC 1.011 04/30/2019 1815   PHURINE 6.0 04/30/2019 1815   GLUCOSEU NEGATIVE 04/30/2019 1815   HGBUR SMALL (A) 04/30/2019 1815   BILIRUBINUR NEGATIVE 04/30/2019 1815   KETONESUR NEGATIVE 04/30/2019 1815   PROTEINUR NEGATIVE 04/30/2019 1815   NITRITE NEGATIVE 04/30/2019 1815   LEUKOCYTESUR NEGATIVE 04/30/2019 1815   Urine Drug Screen     Component Value Date/Time   LABOPIA NONE DETECTED 04/17/2019 0040   COCAINSCRNUR NONE DETECTED 04/17/2019 0040   LABBENZ NONE DETECTED 04/17/2019 0040   AMPHETMU NONE DETECTED 04/17/2019 0040   THCU NONE DETECTED 04/17/2019 0040   LABBARB NONE DETECTED 04/17/2019 0040    Alcohol Level    Component Value Date/Time   ETH <10 04/16/2019 2214     SIGNIFICANT DIAGNOSTIC STUDIES DG CHEST PORT 1 VIEW  Result Date: 04/30/2019 CLINICAL DATA:  Hypertension, dementia, leukocytosis EXAM: PORTABLE CHEST 1 VIEW COMPARISON:  04/28/2019 FINDINGS: Single frontal view of the chest demonstrates a stable cardiac  silhouette. Calcified mediastinal and hilar lymph nodes as well as multiple bilateral calcified granulomas consistent with sequela of previous granulomatous disease. No acute airspace disease, effusion, or pneumothorax. No acute bony abnormalities. IMPRESSION: 1. Stable exam, no acute process. Electronically Signed   By: Sharlet Salina M.D.   On: 04/30/2019 17:49   DG CHEST PORT 1 VIEW  Result Date: 04/28/2019 CLINICAL DATA:  Leukocytosis. EXAM: PORTABLE CHEST 1 VIEW COMPARISON:  None. FINDINGS: The cardiomediastinal contours are normal. Upper normal heart size with aortic atherosclerosis. Multiple calcified pulmonary nodules consistent with prior granulomatous disease. Suspected calcified mediastinal and right hilar nodes. Pulmonary vasculature is normal. No consolidation,  pleural effusion, or pneumothorax. No acute osseous abnormalities are seen. IMPRESSION: 1. No acute chest findings.  No evidence of pneumonia. 2. Sequela of prior granulomatous disease. Aortic Atherosclerosis (ICD10-I70.0). Electronically Signed   By: Narda Rutherford M.D.   On: 04/28/2019 18:00   HOSPITAL COURSE [name] is a 86 y.o. male with history of hypertension and  mild dementia (medical care at [facility]), presents to the emergency department with sudden onset of slurred speech, aphasia, left gaze preference with right-sided neglect, right hemiparesis, facial droop and weakness. He received IV t-PA Friday 04/16/19 at 2230. He has been stable and medically ready to d/c for months. However, this was prolonged d/t uninsured pt who had to await a court appointed guardian.  Stroke: Left ACA territory infarct, likely due to large vessel disease although cardioembolic source not excluded  VTE prophylaxis - lovenox  given significant intracranial stenosis he is on ASA 325 and plavix DAPT for 3 months (this will be on July 16, 2019)-->and then ASA alone   Therapy recommendations: SNF   Disposition:  Pending - pt without  insurance coverage - court appointed guardianship papers received - SW continues to work on placement. Expect d/c in next 24-48h to SNF. COVID test neg.  Medically ready for d/c   Hypertension  On norvasc 10 mg  Labetalol 200mg  tid (increased last week)  BP 140s  goal normotensive  Hyperlipidemia  LDL 113  On lipitor 40  Dysphagia  On D2 thin liquids w/ good po intake  AKI on CKD 3a   1.14  Monitor Cre  Other Active Problems  Dementia - on galantamine and Risperdal   Hyponatremia - 136 - resolved   DISCHARGE EXAM Blood pressure (!) 149/66, pulse 85, temperature 98.3 F (36.8 C), temperature source Oral, resp. rate 18, weight 62.3 kg, SpO2 96 %. General- Well nourished, well developed elderly caucasian male, in no apparent distress.  Cardiovascular - Regular rhythm and rate. Neuro - awake alert, making eye contact, tracking bilaterally and attends well with staff at bedside. Able to follow simple commands. Moderate dysarthria with paraphasic errors, but orientated to place, self, age. But not to time. Can state his whole name. Able to name objects, repeat simple sentence but moderate dysarthric voice. Paucity of speech. Blinking to visual threat bilaterally. Right facial droop. Tongue midline. Left upper extremity 4/5, left lower extremity at least 3/5. Right upper extremity 3/5 with mildly increased muscle tone, and right lower extremity plegic with increased muscle tone. Right triple reflex positive. Global bradykinesia and rigidity. Sensation, coordination not cooperative and gait not tested.  Discharge Diet       Diet   DIET DYS 2 Room service appropriate? No; Fluid consistency: Thin   liquids  DISCHARGE PLAN  Disposition:  SNF  ASA + Plavix for secondary stroke prevention for 3 months then ASA alone.  D/c Plavix on 07/16/19, keep ASA 325mg  daily only  Ongoing stroke risk factor control by Primary Care Physician at time of discharge  Follow-up  PCP [facility] in 2 week or facility Provider  Follow-up in [facility] in 4 weeks, office to schedule an appointment.   45 minutes were spent preparing discharge.  Desiree Metzger-Cihelka, ARNP-C, ANVP-BC Pager: [phone]  I have personally obtained history,examined this patient, reviewed notes, independently viewed imaging studies, participated in medical decision making and plan of care.ROS completed by me personally and pertinent positives fully documented  I have made any additions or clarifications directly  to the above note. Agree with note above.   [name], MD Medical Director [facility] Pager: [phone] 07/02/2019 4:49 PM

## 2019-05-25 NOTE — Progress Notes (Signed)
Occupational Therapy Treatment Patient Details Name: Scott Gilbert MRN: [id number] DOB: Feb 02, 1930 Today's Date: 05/25/2019    History of present illness 86 y.o. male with past medical history significant for hypertension and mild dementia. He presented to the emergency department with sudden onset of slurred speech and aphasia, facial droop and weakness.   CT cerebral perfusion on 04/16/19 reported: "acute distal Left ACA territory ischemia and infarct."   OT comments  Pt demonstrates basic transfer at [facility] ()A level but with max (A) to weight shift and advance R LE. Pt requires KI and ace wrap on R LE to prevent buckle. Pt giggling throughout session and super pleasant. Pt was able to correctly identify colors of flowers in room.    Follow Up Recommendations  CIR;Supervision/Assistance - 24 hour    Equipment Recommendations  Wheelchair (measurements OT);Wheelchair cushion (measurements OT);3 in 1 bedside commode    Recommendations for Other Services      Precautions / Restrictions Precautions Precautions: Fall Precaution Comments: Utilized KI for gt training and ace wrapped foot into dorsiflexion. Required Braces or Orthoses: Knee Immobilizer - Right Knee Immobilizer - Right: On when out of bed or walking       Mobility Bed Mobility Overal bed mobility: Needs Assistance Bed Mobility: Supine to Sit Rolling: Mod assist;+2 for physical assistance   Supine to sit: Mod assist;+2 for safety/equipment     General bed mobility comments: pt requires (A) to elevate trunk from bed surface. pt requires (A) at EOB to sustain static sitting  Transfers Overall transfer level: Needs assistance Equipment used: Rolling walker (2 wheeled) Transfers: Sit to/from Stand Sit to Stand: Mod assist;+2 safety/equipment         General transfer comment: pt progressed > 10 ft with RW and (A) to weight shift in RW. Pt required hand over hand placement on RW for correct positioning    Balance  Overall balance assessment: Needs assistance Sitting-balance support: Single extremity supported;Feet supported Sitting balance-Leahy Scale: Fair     Standing balance support: Bilateral upper extremity supported;During functional activity Standing balance-Leahy Scale: Poor                             ADL either performed or assessed with clinical judgement   ADL Overall ADL's : Needs assistance/impaired Eating/Feeding: Minimal assistance Eating/Feeding Details (indicate cue type and reason): pt drinking water from straw in cup all in one sip and needed therapist to terminate to help control volume and aspiration risk Grooming: Maximal assistance                     Toilet Transfer Details (indicate cue type and reason): condom cath off on arrival with mitten on L hand.          Functional mobility during ADLs: +2 for physical assistance;+2 for safety/equipment;Moderate assistance;Rolling walker General ADL Comments: pt requires (A) to position R LE and weight shift to advance R foot. pt progressed to chair follow and mod ()A with repetition     Vision       Perception     Praxis      Cognition Arousal/Alertness: Awake/alert Behavior During Therapy: WFL for tasks assessed/performed                                   General Comments: accurately reports color of 3 flowers in the room.  pt giggles in response to therapists        Exercises     Shoulder Instructions       General Comments requesting tech to don new condom cath and all required items placed together in room for staff.  KI don for session and doff for sitting in chair. towel roll placed under R heel to help with heel float    Pertinent Vitals/ Pain       Pain Assessment: No/denies pain  Home Living                                          Prior Functioning/Environment              Frequency  Min 2X/week        Progress Toward  Goals  OT Goals(current goals can now be found in the care plan section)  Progress towards OT goals: Progressing toward goals  Acute Rehab OT Goals Patient Stated Goal: unable to state OT Goal Formulation: Patient unable to participate in goal setting Time For Goal Achievement: 06/08/19 Potential to Achieve Goals: Good ADL Goals Pt Will Perform Eating: with min guard assist;sitting Pt Will Perform Grooming: with min guard assist;sitting Pt Will Perform Upper Body Dressing: with min assist;sitting Pt Will Transfer to Toilet: with min assist;ambulating;bedside commode Pt Will Perform Toileting - Clothing Manipulation and hygiene: with min assist;sitting/lateral leans;sit to/from stand Pt/caregiver will Perform Home Exercise Program: Increased strength;Right Upper extremity;Increased ROM;With minimal assist  Plan Discharge plan remains appropriate    Co-evaluation    PT/OT/SLP Co-Evaluation/Treatment: Yes Reason for Co-Treatment: Complexity of the patient's impairments (multi-system involvement);For patient/therapist safety;To address functional/ADL transfers   OT goals addressed during session: ADL's and self-care;Proper use of Adaptive equipment and DME;Strengthening/ROM      AM-PAC OT "6 Clicks" Daily Activity     Outcome Measure   Help from another person eating meals?: A Lot Help from another person taking care of personal grooming?: A Little Help from another person toileting, which includes using toliet, bedpan, or urinal?: A Lot Help from another person bathing (including washing, rinsing, drying)?: A Lot Help from another person to put on and taking off regular upper body clothing?: A Lot Help from another person to put on and taking off regular lower body clothing?: A Lot 6 Click Score: 13    End of Session Equipment Utilized During Treatment: Gait belt;Rolling walker  OT Visit Diagnosis: Unsteadiness on feet (R26.81);Muscle weakness (generalized) (M62.81)    Activity Tolerance Patient tolerated treatment well   Patient Left in chair;with call bell/phone within reach;with chair alarm set(pt attempting to don gait belt himself)   Nurse Communication Mobility status;Precautions        Time: 1610-9604 OT Time Calculation (min): 29 min  Charges: OT General Charges $OT Visit: 1 Visit OT Treatments $Therapeutic Activity: 8-22 mins   Brynn, OTR/L  Acute Rehabilitation Services Pager: [phone] Office: [phone] .    Jeri Modena 05/25/2019, 3:01 PM

## 2019-05-25 NOTE — Progress Notes (Signed)
STROKE TEAM PROGRESS NOTE   INTERVAL HISTORY "ha ha ha I am doing good". Patient up in bed. RN at bedside. Ready for lunch. Patient becoming more interactive.    OBJECTIVE Vitals:   05/25/19 0007 05/25/19 0321 05/25/19 0803 05/25/19 1214  BP: (!) 169/70 (!) 167/71 (!) 149/66 (!) 164/59  Pulse: 97 91 85 95  Resp: 18  18 18   Temp: 98 F (36.7 C) 98.3 F (36.8 C) 98.3 F (36.8 C) 98.4 F (36.9 C)  TempSrc: Oral Oral Oral Oral  SpO2: 98% 96% 96% 96%  Weight:       CBC:  Recent Labs  Lab 05/23/19 0710 05/24/19 0419  WBC 7.3 6.6  HGB 12.5* 12.4*  HCT 36.7* 36.1*  MCV 93.4 92.3  PLT 236 235   Basic Metabolic Panel:  Recent Labs  Lab 05/23/19 0710 05/24/19 0419  NA 133* 134*  K 4.4 4.5  CL 101 100  CO2 24 25  GLUCOSE 107* 103*  BUN 22 22  CREATININE 1.13 1.30*  CALCIUM 8.8* 9.0    IMAGING past 24h No results found.   PHYSICAL EXAM    General - Well nourished, well developed elderly caucasian male, in no apparent distress.    Ophthalmologic - fundi not visualized due to noncooperation.  Cardiovascular - Regular rhythm and rate.  Neuro - awake alert, making eye contact, tracking bilaterally. Able to follow occasional commands on the L. Able to laugh, and able to say his name but wrong on age, able to tell me he is ok today. Gaze laterally, blinking to visual threat bilaterally. Right facial droop.  Tongue midline.  Left upper extremity 4/5, left lower extremity at least 3/5.  Right upper extremity 3/5 with significantly increased muscle tone, and right lower extremity withdrawal on pain stimulation 2+/5 with increased muscle tone.  No Babinski. Sensation, coordination and gait not tested. Global bradykinesia and rigidity.    ASSESSMENT/PLAN [name] is a 86 y.o. male with history of hypertension and  mild dementia (medical care at [facility]), presents to the emergency department with sudden onset of slurred speech, aphasia, left gaze preference with right-sided  neglect, right hemiparesis, facial droop and weakness. He received IV t-PA Friday 04/16/19 at 2230.  Stroke: Left ACA territory infarct, likely due to large vessel disease although cardioembolic source not excluded  Consider 30 day cardiac event monitoring as outpt vs inpt loop placement on discharge (more cost effective)  VTE prophylaxis - lovenox  No antithrombotic prior to admission, now on ASA 325 and plavix DAPT for 3 months and then ASA alone given significant intracranial stenosis  Palliative consult 04/23/19 - Full Code - plan OP referral to Hospice. Continue to follow pt as an IP, routine attempts to contact family.  Therapy recommendations: SNF   Disposition:  Pending - pt without insurance coverage - SW continues to be involved   Medically ready for d/c once d/c plan determined.   Hypertension  On lisinopril to 10 -> 20 mg  On norvasc 5 -> 10 mg . SBP stable 150-160s . BP goal normotensive  Hyperlipidemia  LDL 113, goal < 70  Current lipid lowering medication: on lipitor 40  Continue statin at discharge  Dysphagia . Secondary to stroke . D1 thin liquids->D2 thin liquids . Speech on board . Pt with good po intake eating 100% meals and snacks . On IVF at 50/h  AKI on CKD 3a   Cre 1.24 ->2.05-> NS @ 75 cc/hr 05/21/2019 - 1.13-> decreased  IVF to 50cc/hr ->1.30 ->continue IVF  Other Stroke Risk Factors  Advanced age  Hx stroke/TIA - by imaging  Other Active Problems  Dementia - on galantamine and Risperdal   Hyponatremia - 133->134  Hospital day # [place], MSN, APRN, ANVP-BC, AGPCNP-BC Advanced Practice Stroke Nurse [place] for Schedule & Pager information 05/25/2019 1:09 PM      To contact Stroke Continuity provider, please refer to [email or web address] After hours, contact General Neurology

## 2019-05-26 LAB — BASIC METABOLIC PANEL
Anion gap: 9 (ref 5–15)
BUN: 20 mg/dL (ref 8–23)
CO2: 24 mmol/L (ref 22–32)
Calcium: 8.7 mg/dL — ABNORMAL LOW (ref 8.9–10.3)
Chloride: 101 mmol/L (ref 98–111)
Creatinine, Ser: 1.17 mg/dL (ref 0.61–1.24)
GFR calc Af Amer: >60 mL/min (ref >60)
GFR calc non Af Amer: 56 mL/min — ABNORMAL LOW (ref >60)
Glucose, Bld: 107 mg/dL — ABNORMAL HIGH (ref 70–99)
Potassium: 3.8 mmol/L (ref 3.5–5.1)
Sodium: 134 mmol/L — ABNORMAL LOW (ref 135–145)

## 2019-05-26 LAB — CBC
HCT: 33.6 % — ABNORMAL LOW (ref 39.0–52.0)
Hemoglobin: 11.7 g/dL — ABNORMAL LOW (ref 13.0–17.0)
MCH: 31.6 pg (ref 26.0–34.0)
MCHC: 34.8 g/dL (ref 30.0–36.0)
MCV: 90.8 fL (ref 80.0–100.0)
Platelets: 225 10*3/uL (ref 150–400)
RBC: 3.7 MIL/uL — ABNORMAL LOW (ref 4.22–5.81)
RDW: 12.1 % (ref 11.5–15.5)
WBC: 6.2 10*3/uL (ref 4.0–10.5)
nRBC: 0 % (ref 0.0–0.2)

## 2019-05-26 MED ORDER — LABETALOL HCL 100 MG PO TABS
100.0000 mg | ORAL_TABLET | Freq: Two times a day (BID) | ORAL | Status: DC
  Administered 2019-05-26 – 2019-06-20 (×51): 100 mg via ORAL
  Filled 2019-05-26 (×51): qty 1

## 2019-05-26 NOTE — Progress Notes (Signed)
Palliative care following for additional support. Patient has made significant improvements since admission and appears to be maintaining his stability and progression. He can participate in simple conversation and has not had any significant aspiration events with careful PO intake. Barriers remain in terms of discharge planning and uninsured status. Will continue to attempt to reach his daughter to discuss goals and to engage her in his care planning- happy to partner with [facility] team to effectively communicate status and options.  He remains on IV fluids at a maintenance rate of 50cc/hr and concern for mild dehydration on 3/2- could consider discontinuing this and leaving a saline lock, encourage PO fluid intake as tolerated q2 hours- will need to be offered assistance with fluids.  Patient without pain or other symptom management needs.  Anderson Malta, DO Palliative Medicine

## 2019-05-26 NOTE — Progress Notes (Signed)
Occupational Therapy Treatment Patient Details Name: Scott Gilbert MRN: [id number] DOB: 1929/04/25 Today's Date: 05/26/2019    History of present illness 86 y.o. male with past medical history significant for hypertension and mild dementia. He presented to the emergency department with sudden onset of slurred speech and aphasia, facial droop and weakness.   CT cerebral perfusion on 04/16/19 reported: "acute distal Left ACA territory ischemia and infarct."   OT comments  Pt continues to progress with OT this session by completing > 3 minutes worth of grooming task at the sink.. pt completed oral care with min cues. Pt brushing sink surface with comb and needs cues to properly use item. Pt happy to be oob to chair.   Follow Up Recommendations  Supervision/Assistance - 24 hour;SNF    Equipment Recommendations  Wheelchair (measurements OT);Wheelchair cushion (measurements OT);3 in 1 bedside commode    Recommendations for Other Services      Precautions / Restrictions Precautions Precautions: Fall Precaution Comments: Utilized KI for gt training and ace wrapped foot into dorsiflexion. Required Braces or Orthoses: Knee Immobilizer - Right;Other Brace       Mobility Bed Mobility Overal bed mobility: Needs Assistance Bed Mobility: Supine to Sit Rolling: Min assist   Supine to sit: Min assist     General bed mobility comments: pt bridged buttock with LLE and progressed to EOB. pt requires (A) to active full upright posture due to KI. pt once KI off eob able to static sit minguard to min (A) .   Transfers Overall transfer level: Needs assistance Equipment used: Rolling walker (2 wheeled) Transfers: Sit to/from Stand Sit to Stand: Min assist         General transfer comment: pt power ups and requires hand over hand max (A) to place R hand. pt with transfer ~8 ft with (A) to advance R LE     Balance                                           ADL either  performed or assessed with clinical judgement   ADL Overall ADL's : Needs assistance/impaired Eating/Feeding: Minimal assistance;Sitting   Grooming: Oral care;Min guard;Sitting               Lower Body Dressing: Maximal assistance   Toilet Transfer: Moderate assistance             General ADL Comments: pt requires max (A) for KI and ace wrap to R LE. pt lifting L LE to help with bed mobility     Vision       Perception     Praxis      Cognition Arousal/Alertness: Awake/alert Behavior During Therapy: WFL for tasks assessed/performed                                   General Comments: accurately applies clip to posey belt. pt smiling to conversation and laughing appropriately.         Exercises     Shoulder Instructions       General Comments pt is able to fully extend R hand. pt needs increaed time and (A) of L hand to release objects.     Pertinent Vitals/ Pain       Pain Assessment: No/denies pain  Home Living  Prior Functioning/Environment              Frequency  Min 2X/week        Progress Toward Goals  OT Goals(current goals can now be found in the care plan section)  Progress towards OT goals: Progressing toward goals  Acute Rehab OT Goals Patient Stated Goal: unable to state OT Goal Formulation: Patient unable to participate in goal setting Time For Goal Achievement: 06/08/19 Potential to Achieve Goals: Good ADL Goals Pt Will Perform Eating: with min guard assist;sitting Pt Will Perform Grooming: with min guard assist;sitting Pt Will Perform Upper Body Dressing: with min assist;sitting Pt Will Transfer to Toilet: with min assist;ambulating;bedside commode Pt Will Perform Toileting - Clothing Manipulation and hygiene: with min assist;sitting/lateral leans;sit to/from stand Pt/caregiver will Perform Home Exercise Program: Increased strength;Right Upper  extremity;Increased ROM;With minimal assist Additional ADL Goal #1: Pt will participate in x3 mins of ADL task with moderate cueing to attend.  Plan Discharge plan remains appropriate    Co-evaluation                 AM-PAC OT "6 Clicks" Daily Activity     Outcome Measure   Help from another person eating meals?: A Lot Help from another person taking care of personal grooming?: A Little Help from another person toileting, which includes using toliet, bedpan, or urinal?: A Lot Help from another person bathing (including washing, rinsing, drying)?: A Lot Help from another person to put on and taking off regular upper body clothing?: A Lot Help from another person to put on and taking off regular lower body clothing?: A Lot 6 Click Score: 13    End of Session Equipment Utilized During Treatment: Gait belt;Rolling walker  OT Visit Diagnosis: Unsteadiness on feet (R26.81);Muscle weakness (generalized) (M62.81)   Activity Tolerance Patient tolerated treatment well   Patient Left in chair;with call bell/phone within reach;with chair alarm set   Nurse Communication Mobility status;Precautions        Time: 1126-1150 OT Time Calculation (min): 24 min  Charges: OT General Charges $OT Visit: 1 Visit OT Treatments $Self Care/Home Management : 23-37 mins   [place], OTR/L  Acute Rehabilitation Services Pager: [phone] Office: [phone] .    Mateo Flow 05/26/2019, 6:53 PM

## 2019-05-26 NOTE — Progress Notes (Addendum)
STROKE TEAM PROGRESS NOTE   INTERVAL HISTORY Patient up in chair at bedside. Readily speaks on my arrival which is new. Medically stable. Waved me away when I asked him to wave.   OBJECTIVE Vitals:   05/25/19 2345 05/26/19 0417 05/26/19 0800 05/26/19 1214  BP: (!) 159/73 (!) 158/87 (!) 160/77 (!) 163/68  Pulse: 90 86 90 85  Resp: 18 16 18 18   Temp: 98.4 F (36.9 C) 98.4 F (36.9 C) 97.7 F (36.5 C) 99.6 F (37.6 C)  TempSrc: Oral Oral Oral Oral  SpO2: 97% 94% 97% 97%  Weight:       CBC:  Recent Labs  Lab 05/24/19 0419 05/26/19 0456  WBC 6.6 6.2  HGB 12.4* 11.7*  HCT 36.1* 33.6*  MCV 92.3 90.8  PLT 235 225   Basic Metabolic Panel:  Recent Labs  Lab 05/24/19 0419 05/26/19 0456  NA 134* 134*  K 4.5 3.8  CL 100 101  CO2 25 24  GLUCOSE 103* 107*  BUN 22 20  CREATININE 1.30* 1.17  CALCIUM 9.0 8.7*    IMAGING past 24h No results found.   PHYSICAL EXAM    General - Well nourished, well developed elderly caucasian male, in no apparent distress.    Cardiovascular - Regular rhythm and rate.  Neuro - awake alert, making eye contact, tracking bilaterally. Able to simple commands on the L. Able to laugh, and able to say his name and how he is doing. Gaze laterally, blinking to visual threat bilaterally. Right facial droop.  Tongue midline.  Left upper extremity 4/5, left lower extremity at least 3/5.  Right upper extremity 3/5 with significantly increased muscle tone with hand clenched, and right lower extremity withdrawal on pain stimulation 2+/5 with increased muscle tone.  No Babinski. Sensation, coordination and gait not tested. Global bradykinesia and rigidity.    ASSESSMENT/PLAN [name] is a 86 y.o. male with history of hypertension and  mild dementia (medical care at [facility]), presents to the emergency department with sudden onset of slurred speech, aphasia, left gaze preference with right-sided neglect, right hemiparesis, facial droop and weakness. He  received IV t-PA Friday 04/16/19 at 2230.  Stroke: Left ACA territory infarct, likely due to large vessel disease although cardioembolic source not excluded  Consider 30 day cardiac event monitoring as outpt vs inpt loop placement on discharge (more cost effective)  VTE prophylaxis - lovenox  No antithrombotic prior to admission, now on ASA 325 and plavix DAPT for 3 months and then ASA alone given significant intracranial stenosis  Palliative consult 04/23/19 - Full Code - plan OP referral to Hospice. Continue to follow pt as an IP, routine attempts to contact family.  Therapy recommendations: SNF   Disposition:  Pending - pt without insurance coverage - SW continues to be involved   Medically ready for d/c once d/c plan determined.   Hypertension  Off lisinopril due to elevated Cre  On norvasc 5 -> 10 mg  Will add labetalol 100mg  bid . SBP on the high end 160s . BP goal normotensive  Hyperlipidemia  LDL 113, goal < 70  Current lipid lowering medication: on lipitor 40  Continue statin at discharge  Dysphagia . Secondary to stroke . D1 thin liquids->D2 thin liquids . Speech on board . Pt with good po intake eating 100% meals and snacks . On IVF at 50/h  AKI on CKD 3a   Cre 1.30->continue NS 50cc/hr -> 1.17  Have repetitively to stopped IVF after staff  education to encourage pos but Cre always goes up  Other Stroke Risk Factors  Advanced age  Hx stroke/TIA - by imaging  Other Active Problems  Dementia - on galantamine and Risperdal   Hyponatremia - 133->134  Hospital day # [place], MSN, APRN, ANVP-BC, AGPCNP-BC Advanced Practice Stroke Nurse [place] for Schedule & Pager information 05/26/2019 12:40 PM    ATTENDING NOTE: I reviewed above note and agree with the assessment and plan. Pt doing stable and pending SNF. His BP on the high end, on amlodipine. Off lisinopril due to elevated Cre, will start labetalol low dose,  continue monitoring vitals.    [name], MD PhD Stroke Neurology 05/26/2019 3:37 PM     To contact Stroke Continuity provider, please refer to [email or web address] After hours, contact General Neurology

## 2019-05-27 NOTE — Progress Notes (Signed)
Physical Therapy Treatment Patient Details Name: Scott Gilbert MRN: [id number] DOB: 08-13-1929 Today's Date: 05/27/2019    History of Present Illness 86 y.o. male with past medical history significant for hypertension and mild dementia. He presented to the emergency department with sudden onset of slurred speech and aphasia, facial droop and weakness.   CT cerebral perfusion on 04/16/19 reported: "acute distal Left ACA territory ischemia and infarct."    PT Comments    Pt performed supine LE exercises with PROM to R UE/LE.  Pt also performed transfer training and side stepping without brace with manual facilitation of R quad and for advancement of RLE.      Follow Up Recommendations  Supervision/Assistance - 24 hour;SNF     Equipment Recommendations  Wheelchair (measurements PT)    Recommendations for Other Services Rehab consult     Precautions / Restrictions Precautions Precautions: Fall Restrictions Weight Bearing Restrictions: No    Mobility  Bed Mobility Overal bed mobility: Needs Assistance Bed Mobility: Supine to Sit;Sit to Supine Rolling: Mod assist Sidelying to sit: Mod assist   Sit to supine: Max assist   General bed mobility comments: pt bridged buttock with LLE and progressed to EOB. pt requires (A) to active full upright posture Assistance for R LE.  Pt moved to R side of bed.  Able to hold sitting balance once in sitting.  Transfers Overall transfer level: Needs assistance Equipment used: Rolling walker (2 wheeled) Transfers: Sit to/from Stand Sit to Stand: Mod assist         General transfer comment: Performed with out KI with emphasis on facilitating knee flexion and extension during multiple transfers.  Ambulation/Gait Ambulation/Gait assistance: Max assist Gait Distance (Feet): (series of sidestepping edge of bed with R knee blocked and assistance to advance to the R.) Assistive device: Rolling walker (2 wheeled) Gait Pattern/deviations:  Step-to pattern;Shuffle         Stairs             Wheelchair Mobility    Modified Rankin (Stroke Patients Only)       Balance                                            Cognition Arousal/Alertness: Awake/alert Behavior During Therapy: WFL for tasks assessed/performed Overall Cognitive Status: Difficult to assess                                        Exercises Other Exercises Other Exercises: PROM R hip stretch to ER's, quads and HS Other Exercises: Supine: PROM R hip/knee flexion, elbow flexion/ext    General Comments        Pertinent Vitals/Pain Pain Assessment: No/denies pain Faces Pain Scale: Hurts little more Pain Location: with RUE ROm and R HS with stretching Pain Descriptors / Indicators: Tightness Pain Intervention(s): Monitored during session;Repositioned    Home Living                      Prior Function            PT Goals (current goals can now be found in the care plan section) Acute Rehab PT Goals Patient Stated Goal: unable to state Potential to Achieve Goals: Fair Progress towards PT goals: Progressing toward goals  Frequency    Min 3X/week      PT Plan Current plan remains appropriate    Co-evaluation              AM-PAC PT "6 Clicks" Mobility   Outcome Measure  Help needed turning from your back to your side while in a flat bed without using bedrails?: A Lot Help needed moving from lying on your back to sitting on the side of a flat bed without using bedrails?: A Lot Help needed moving to and from a bed to a chair (including a wheelchair)?: A Lot Help needed standing up from a chair using your arms (e.g., wheelchair or bedside chair)?: A Lot Help needed to walk in hospital room?: A Lot Help needed climbing 3-5 steps with a railing? : Total 6 Click Score: 11    End of Session Equipment Utilized During Treatment: Gait belt Activity Tolerance: Patient tolerated  treatment well Patient left: with chair alarm set;in chair;with call bell/phone within reach Nurse Communication: Mobility status PT Visit Diagnosis: Other abnormalities of gait and mobility (R26.89);Hemiplegia and hemiparesis;Muscle weakness (generalized) (M62.81) Hemiplegia - Right/Left: Right Hemiplegia - dominant/non-dominant: Dominant Hemiplegia - caused by: Cerebral infarction     Time: 6195-0932 PT Time Calculation (min) (ACUTE ONLY): 30 min  Charges:  $Gait Training: 8-22 mins $Therapeutic Activity: 8-22 mins                     Erasmo Leventhal , PTA Acute Rehabilitation Services Pager [phone] Office [phone]     Kaliya Shreiner [name] 05/27/2019, 6:24 PM

## 2019-05-27 NOTE — Progress Notes (Addendum)
STROKE TEAM PROGRESS NOTE   INTERVAL HISTORY Lying in bed. No complaints. Waiting on his lunch.   OBJECTIVE Vitals:   05/26/19 2329 05/27/19 0336 05/27/19 0758 05/27/19 1147  BP: (!) 159/72 (!) 153/72 139/63 (!) 123/53  Pulse: 89 88 74 78  Resp: 18 18 18 18   Temp: 98.3 F (36.8 C) 98.1 F (36.7 C) 98.5 F (36.9 C) 98.5 F (36.9 C)  TempSrc: Oral Oral Oral Oral  SpO2: 99% 100% 97% 96%  Weight:       CBC:  Recent Labs  Lab 05/24/19 0419 05/26/19 0456  WBC 6.6 6.2  HGB 12.4* 11.7*  HCT 36.1* 33.6*  MCV 92.3 90.8  PLT 235 638   Basic Metabolic Panel:  Recent Labs  Lab 05/24/19 0419 05/26/19 0456  NA 134* 134*  K 4.5 3.8  CL 100 101  CO2 25 24  GLUCOSE 103* 107*  BUN 22 20  CREATININE 1.30* 1.17  CALCIUM 9.0 8.7*    IMAGING past 24h No results found.   PHYSICAL EXAM     General - Well nourished, well developed elderly caucasian male, in no apparent distress.    Cardiovascular - Regular rhythm and rate.  Neuro - awake alert, making eye contact, tracking bilaterally. Able to simple commands on the L. Able to laugh, and able to say his name and how he is doing, named "key". Gaze laterally, blinking to visual threat bilaterally. Right facial droop.  Tongue midline.  Left upper extremity 4/5, left lower extremity at least 3/5.  Right upper extremity 3/5 with significantly increased muscle tone with hand clenched, and right lower extremity withdrawal on pain stimulation 2+/5 with increased muscle tone.  No Babinski. Sensation, coordination and gait not tested. Global bradykinesia and rigidity.    ASSESSMENT/PLAN Scott Gilbert is a 86 y.o. male with history of hypertension and  mild dementia (medical care at [facility]), presents to the emergency department with sudden onset of slurred speech, aphasia, left gaze preference with right-sided neglect, right hemiparesis, facial droop and weakness. He received IV t-PA Friday 04/16/19 at 2230.  Stroke: Left ACA territory  infarct, likely due to large vessel disease although cardioembolic source not excluded  Consider 30 day cardiac event monitoring as outpt vs inpt loop placement on discharge (more cost effective)  VTE prophylaxis - lovenox  No antithrombotic prior to admission, now on ASA 325 and plavix DAPT for 3 months and then ASA alone given significant intracranial stenosis  Palliative consult 04/23/19 - Full Code - plan OP referral to Hospice. Continue to follow pt as an IP, routine attempts to contact family.  Therapy recommendations: SNF   Disposition:  Pending - pt without insurance coverage - SW continues to be involved   Medically ready for d/c once d/c plan determined.   Hypertension  Off lisinopril due to elevated Cre  On norvasc 5 -> 10 mg  On labetalol 100mg  bid . SBP stable 120-150s . BP goal normotensive  Hyperlipidemia  LDL 113, goal < 70  Current lipid lowering medication: on lipitor 40  Continue statin at discharge  Dysphagia . Secondary to stroke . D1 thin liquids->D2 thin liquids . Speech on board . Pt with good po intake eating 100% meals and snacks . On IVF at 50/h  AKI on CKD 3a   Cre 1.30->->1.17->1.36  Have repetitively stopped IVF after staff education to encourage pos but Cre always goes up  continue NS 50cc/hr   Other Stroke Risk Factors  Advanced age  Hx stroke/TIA - by imaging  Other Active Problems  Dementia - on galantamine and Risperdal   Hyponatremia - 133->134->135  Hospital day # [address]  Scott Main, MSN, APRN, ANVP-BC, AGPCNP-BC Advanced Practice Stroke Nurse [facility] See Amion for Schedule & Pager information 05/27/2019 3:42 PM        To contact Stroke Continuity provider, please refer to [email or web address]. After hours, contact General Neurology

## 2019-05-28 LAB — CBC
HCT: 33.8 % — ABNORMAL LOW (ref 39.0–52.0)
Hemoglobin: 11.6 g/dL — ABNORMAL LOW (ref 13.0–17.0)
MCH: 32 pg (ref 26.0–34.0)
MCHC: 34.3 g/dL (ref 30.0–36.0)
MCV: 93.1 fL (ref 80.0–100.0)
Platelets: 235 10*3/uL (ref 150–400)
RBC: 3.63 MIL/uL — ABNORMAL LOW (ref 4.22–5.81)
RDW: 12.2 % (ref 11.5–15.5)
WBC: 6.9 10*3/uL (ref 4.0–10.5)
nRBC: 0 % (ref 0.0–0.2)

## 2019-05-28 LAB — BASIC METABOLIC PANEL
Anion gap: 11 (ref 5–15)
BUN: 27 mg/dL — ABNORMAL HIGH (ref 8–23)
CO2: 23 mmol/L (ref 22–32)
Calcium: 8.6 mg/dL — ABNORMAL LOW (ref 8.9–10.3)
Chloride: 101 mmol/L (ref 98–111)
Creatinine, Ser: 1.36 mg/dL — ABNORMAL HIGH (ref 0.61–1.24)
GFR calc Af Amer: 54 mL/min — ABNORMAL LOW (ref >60)
GFR calc non Af Amer: 46 mL/min — ABNORMAL LOW (ref >60)
Glucose, Bld: 105 mg/dL — ABNORMAL HIGH (ref 70–99)
Potassium: 4.1 mmol/L (ref 3.5–5.1)
Sodium: 135 mmol/L (ref 135–145)

## 2019-05-28 NOTE — Progress Notes (Signed)
STROKE TEAM PROGRESS NOTE   INTERVAL HISTORY Lying in bed. No complaints. Stable from day before.   OBJECTIVE Vitals:   05/27/19 2009 05/27/19 2333 05/28/19 0310 05/28/19 0810  BP: (!) 144/56 (!) 131/57 127/64 138/61  Pulse: 86 84 75 74  Resp: 18 18 18 16   Temp: 98.1 F (36.7 C) 99.2 F (37.3 C) 97.9 F (36.6 C) 98.5 F (36.9 C)  TempSrc: Oral Oral Oral Oral  SpO2: 96% 96% 96% 98%  Weight:       CBC:  Recent Labs  Lab 05/26/19 0456 05/28/19 0446  WBC 6.2 6.9  HGB 11.7* 11.6*  HCT 33.6* 33.8*  MCV 90.8 93.1  PLT 225 035   Basic Metabolic Panel:  Recent Labs  Lab 05/26/19 0456 05/28/19 0446  NA 134* 135  K 3.8 4.1  CL 101 101  CO2 24 23  GLUCOSE 107* 105*  BUN 20 27*  CREATININE 1.17 1.36*  CALCIUM 8.7* 8.6*    IMAGING past 24h No results found.   PHYSICAL EXAM    General - Well nourished, well developed elderly caucasian male, in no apparent distress.    Cardiovascular - Regular rhythm and rate.  Neuro - awake alert, making eye contact, tracking bilaterally. Able to simple commands on the L. Able to laugh, and able to say his name and how he is doing. Gaze laterally, blinking to visual threat bilaterally. Right facial droop.  Tongue midline.  Left upper extremity 4/5, left lower extremity at least 3/5.  Right upper extremity 3/5 with significantly increased muscle tone with hand clenched, and right lower extremity withdrawal on pain stimulation 2+/5 with increased muscle tone.  No Babinski. Sensation, coordination and gait not tested. Global bradykinesia and rigidity.    ASSESSMENT/PLAN Scott Gilbert is a 86 y.o. male with history of hypertension and  mild dementia (medical care at [facility]), presents to the emergency department with sudden onset of slurred speech, aphasia, left gaze preference with right-sided neglect, right hemiparesis, facial droop and weakness. He received IV t-PA Friday 04/16/19 at 2230.  Stroke: Left ACA territory infarct, likely due  to large vessel disease although cardioembolic source not excluded  Consider 30 day cardiac event monitoring as outpt vs inpt loop placement on discharge (more cost effective)  VTE prophylaxis - lovenox  No antithrombotic prior to admission, now on ASA 325 and plavix DAPT for 3 months and then ASA alone given significant intracranial stenosis  Palliative consult 04/23/19 - Full Code - plan OP referral to Hospice. Continue to follow pt as an IP, routine attempts to contact family.  Therapy recommendations: SNF   Disposition:  Pending - pt without insurance coverage - SW continues to be involved   Medically ready for d/c once d/c plan determined.   Hypertension  Off lisinopril due to elevated Cre  On norvasc 5 -> 10 mg  On labetalol 100mg  bid . SBP stable 120-130s . BP goal normotensive  Hyperlipidemia  LDL 113, goal < 70  Current lipid lowering medication: on lipitor 40  Continue statin at discharge  Dysphagia . Secondary to stroke . D1 thin liquids->D2 thin liquids . Speech on board . Pt with good po intake eating 100% meals and snacks . On IVF at 50/h  AKI on CKD 3a   Cre 1.30->continue NS 50cc/hr -> 1.17->1.36  Have repetitively stopped IVF after staff education to encourage pos but Cre always goes up  Other Stroke Risk Factors  Advanced age  Hx stroke/TIA - by imaging  Other Active Problems  Dementia - on galantamine and Risperdal   Hyponatremia - 133->134->135  Hospital day # [address]  Annie Main, MSN, APRN, ANVP-BC, AGPCNP-BC Advanced Practice Stroke Nurse [facility] See Amion for Schedule & Pager information 05/28/2019 10:43 AM        To contact Stroke Continuity provider, please refer to [email or web address]. After hours, contact General Neurology

## 2019-05-29 NOTE — Progress Notes (Signed)
STROKE TEAM PROGRESS NOTE   INTERVAL HISTORY Lying in bed, sitting up, alert. No complaints. Neuro Stable.   OBJECTIVE Vitals:   05/28/19 2312 05/29/19 0349 05/29/19 0747 05/29/19 1100  BP: (!) 140/56 (!) 143/53 (!) 147/68 (!) 140/57  Pulse: 80 78 76 76  Resp: 18 18  18   Temp: 97.7 F (36.5 C) 97.7 F (36.5 C) 98.4 F (36.9 C) 98.4 F (36.9 C)  TempSrc: Oral Oral Oral Oral  SpO2: 98% 97% 96% 96%  Weight:       CBC:  Recent Labs  Lab 05/26/19 0456 05/28/19 0446  WBC 6.2 6.9  HGB 11.7* 11.6*  HCT 33.6* 33.8*  MCV 90.8 93.1  PLT 225 782   Basic Metabolic Panel:  Recent Labs  Lab 05/26/19 0456 05/28/19 0446  NA 134* 135  K 3.8 4.1  CL 101 101  CO2 24 23  GLUCOSE 107* 105*  BUN 20 27*  CREATININE 1.17 1.36*  CALCIUM 8.7* 8.6*    IMAGING past 24h No results found.   PHYSICAL EXAM     General - Well nourished, well developed elderly caucasian male, in no apparent distress.    Cardiovascular - Regular rhythm and rate.  Neuro - awake alert, making eye contact, tracking bilaterally. Able to simple commands on the L like raise arm with visual cues. Able to laugh, and able to say his name and how he is doing. Blinking to visual threat bilaterally. Right facial droop.  Tongue midline.  Left upper extremity 4/5, left lower extremity at least 3/5.  Right upper extremity 3/5 with significantly increased muscle tone with hand clenched, and right lower extremity withdrawal on pain stimulation 2+/5 with increased muscle tone.  No Babinski. Sensation, coordination and gait not tested. Global bradykinesia and rigidity.    ASSESSMENT/PLAN Scott Gilbert is a 86 y.o. male with history of hypertension and  mild dementia (medical care at [facility]), presents to the emergency department with sudden onset of slurred speech, aphasia, left gaze preference with right-sided neglect, right hemiparesis, facial droop and weakness. He received IV t-PA Friday 04/16/19 at 2230.  Stroke: Left  ACA territory infarct, likely due to large vessel disease although cardioembolic source not excluded  Consider 30 day cardiac event monitoring as outpt vs inpt loop placement on discharge   VTE prophylaxis - lovenox  No antithrombotic prior to admission, now on ASA 325 and plavix DAPT for 3 months and then ASA alone given significant intracranial stenosis  Palliative consult 04/23/19 - Full Code - plan OP referral to Hospice. Continue to follow pt as an IP, routine attempts to contact family.  Therapy recommendations: SNF   Disposition:  Pending - pt without insurance coverage - SW continues to be involved   Medically ready for d/c once d/c plan determined.   Hypertension  Off lisinopril due to elevated Cre  On norvasc 5 -> 10 mg  On labetalol 100mg  bid . SBP stable 120-150s . BP goal normotensive  Hyperlipidemia  LDL 113, goal < 70  Current lipid lowering medication: on lipitor 40  Continue statin at discharge  Dysphagia . Secondary to stroke . D1 thin liquids->D2 thin liquids . Speech on board . Pt with good po intake eating 100% meals and snacks . On IVF at 50/h  AKI on CKD 3a   Cre 1.30->->1.17->1.36  Have repetitively stopped IVF after staff education to encourage pos but Cre always goes up  Continue NS 50cc/hr   Other Stroke Risk Factors  Advanced  age  Hx stroke/TIA - by imaging  Other Active Problems  Dementia - on galantamine and Risperdal   Hyponatremia - 133->134->135  Plan  Repeat Bmet next week to monitor creatinine  Hospital day # 54  Personally examined patient and images, and have participated in and made any corrections needed to history, physical, neuro exam,assessment and plan as stated above.  I have personally obtained the history, evaluated lab date, reviewed imaging studies and agree with radiology interpretations.    Naomie Dean, MD Stroke Neurology   A total of 15 minutes was spent for the care of this patient, spent on  counseling patient and family on different diagnostic and therapeutic options, counseling and coordination of care, riskd ans benefits of management, compliance, or risk factor reduction and education.      To contact Stroke Continuity provider, please refer to [email or web address]. After hours, contact General Neurology

## 2019-05-30 NOTE — Progress Notes (Signed)
STROKE TEAM PROGRESS NOTE   INTERVAL HISTORY Lying in bed, sitting up, alert. No complaints. Neuro Stable. No new events overnight.  OBJECTIVE Vitals:   05/29/19 2306 05/30/19 0333 05/30/19 0732 05/30/19 1200  BP: (!) 138/92 133/60 (!) 147/67 140/61  Pulse: 88 80 69 77  Resp: 18 18 18 18   Temp: 98.7 F (37.1 C) 98.1 F (36.7 C) 97.6 F (36.4 C) 98 F (36.7 C)  TempSrc: Oral Axillary Oral Oral  SpO2: 94% 96% 98% 97%  Weight:       CBC:  Recent Labs  Lab 05/26/19 0456 05/28/19 0446  WBC 6.2 6.9  HGB 11.7* 11.6*  HCT 33.6* 33.8*  MCV 90.8 93.1  PLT 225 259   Basic Metabolic Panel:  Recent Labs  Lab 05/26/19 0456 05/28/19 0446  NA 134* 135  K 3.8 4.1  CL 101 101  CO2 24 23  GLUCOSE 107* 105*  BUN 20 27*  CREATININE 1.17 1.36*  CALCIUM 8.7* 8.6*    IMAGING past 24h No results found.   PHYSICAL EXAM     Physical exam and neuro exam stable  General - Well nourished, well developed elderly caucasian male, in no apparent distress.    Cardiovascular - Regular rhythm and rate.  Neuro - awake alert, making eye contact, tracking bilaterally. Able to simple commands on the L like raise arm with visual cues. Able to laugh, and able to say his name and how he is doing. Blinking to visual threat bilaterally. Right facial droop.  Tongue midline.  Left upper extremity 4/5, left lower extremity at least 3/5.  Right upper extremity 3/5 with significantly increased muscle tone with hand clenched, and right lower extremity withdrawal on pain stimulation 2+/5 with increased muscle tone.  No Babinski. Sensation, coordination and gait not tested. Global bradykinesia and rigidity.    ASSESSMENT/PLAN [name] is a 86 y.o. male with history of hypertension and  mild dementia (medical care at [facility]), presents to the emergency department with sudden onset of slurred speech, aphasia, left gaze preference with right-sided neglect, right hemiparesis, facial droop and weakness. He  received IV t-PA Friday 04/16/19 at 2230.  Stroke: Left ACA territory infarct, likely due to large vessel disease although cardioembolic source not excluded  Consider 30 day cardiac event monitoring as outpt vs inpt loop placement on discharge   VTE prophylaxis - lovenox  No antithrombotic prior to admission, now on ASA 325 and plavix DAPT for 3 months and then ASA alone given significant intracranial stenosis  Palliative consult 04/23/19 - Full Code - plan OP referral to Hospice. Continue to follow pt as an IP, routine attempts to contact family.  Therapy recommendations: SNF   Disposition:  Pending - pt without insurance coverage - SW continues to be involved   Medically ready for d/c once d/c plan determined.   Hypertension  Off lisinopril due to elevated Cre  On norvasc 5 -> 10 mg  On labetalol 100mg  bid . SBP stable 120-150s . BP goal normotensive  Hyperlipidemia  LDL 113, goal < 70  Current lipid lowering medication: on lipitor 40  Continue statin at discharge  Dysphagia . Secondary to stroke . D1 thin liquids->D2 thin liquids . Speech on board . Pt with good po intake eating 100% meals and snacks . On IVF at 50/h  AKI on CKD 3a   Cre 1.30->->1.17->1.36  Have repetitively stopped IVF after staff education to encourage pos but Cre always goes up  Continue NS 50cc/hr  Other Stroke Risk Factors  Advanced age  Hx stroke/TIA - by imaging  Other Active Problems  Dementia - on galantamine and Risperdal   Hyponatremia - 133->134->135  Plan  Repeat Bmet in AM to monitor creatinine  Hospital day # 60  Personally examined patient and images, and have participated in and made any corrections needed to history, physical, neuro exam,assessment and plan as stated above.  I have personally reviewed the history, evaluated lab date, reviewed imaging studies and agree with radiology interpretations.    Scott Dean, MD Stroke Neurology   A total of 15  minutes was spent for the care of this patient, spent on counseling patient and family on different diagnostic and therapeutic options, counseling and coordination of care, riskd ans benefits of management, compliance, or risk factor reduction and education.    To contact Stroke Continuity provider, please refer to [email or web address]. After hours, contact General Neurology

## 2019-05-31 LAB — BASIC METABOLIC PANEL
Anion gap: 10 (ref 5–15)
BUN: 21 mg/dL (ref 8–23)
CO2: 25 mmol/L (ref 22–32)
Calcium: 8.7 mg/dL — ABNORMAL LOW (ref 8.9–10.3)
Chloride: 101 mmol/L (ref 98–111)
Creatinine, Ser: 1.28 mg/dL — ABNORMAL HIGH (ref 0.61–1.24)
GFR calc Af Amer: 58 mL/min — ABNORMAL LOW (ref >60)
GFR calc non Af Amer: 50 mL/min — ABNORMAL LOW (ref >60)
Glucose, Bld: 99 mg/dL (ref 70–99)
Potassium: 4.2 mmol/L (ref 3.5–5.1)
Sodium: 136 mmol/L (ref 135–145)

## 2019-05-31 NOTE — Progress Notes (Signed)
STROKE TEAM PROGRESS NOTE   INTERVAL HISTORY Olivia Mackie and Tammy bathing patient. He says he is doing fine.   OBJECTIVE Vitals:   05/31/19 0040 05/31/19 0432 05/31/19 0829 05/31/19 1253  BP: (!) 130/58 127/70 (!) 144/62 (!) 148/66  Pulse: 77 75 69 67  Resp: 18 18 19 20   Temp: 98.2 F (36.8 C)  98.7 F (37.1 C) 99.6 F (37.6 C)  TempSrc:   Oral Oral  SpO2: 97% 95% 97% 95%  Weight:       CBC:  Recent Labs  Lab 05/26/19 0456 05/28/19 0446  WBC 6.2 6.9  HGB 11.7* 11.6*  HCT 33.6* 33.8*  MCV 90.8 93.1  PLT 225 350   Basic Metabolic Panel:  Recent Labs  Lab 05/28/19 0446 05/31/19 0416  NA 135 136  K 4.1 4.2  CL 101 101  CO2 23 25  GLUCOSE 105* 99  BUN 27* 21  CREATININE 1.36* 1.28*  CALCIUM 8.6* 8.7*    IMAGING past 24h No results found.   PHYSICAL EXAM     General - Well nourished, well developed elderly caucasian male, in no apparent distress.    Cardiovascular - Regular rhythm and rate.  Neuro - awake alert, making eye contact, tracking bilaterally. Able to simple commands on the L like raise arm with visual cues. Able to laugh, and able to say his name and how he is doing. Blinking to visual threat bilaterally. Right facial droop.  Tongue midline.  Left upper extremity 4/5, left lower extremity at least 3/5.  Right upper extremity 3/5 with significantly increased muscle tone with hand clenched, and right lower extremity withdrawal on pain stimulation 2+/5 with increased muscle tone.  No Babinski. Sensation, coordination and gait not tested. Global bradykinesia and rigidity. Reddened raw area inner L thigh at groin.   ASSESSMENT/PLAN Mr. Lenard Kampf is a 86 y.o. male with history of hypertension and  mild dementia (medical care at [facility]), presents to the emergency department with sudden onset of slurred speech, aphasia, left gaze preference with right-sided neglect, right hemiparesis, facial droop and weakness. He received IV t-PA Friday 04/16/19 at 2230.  Stroke:  Left ACA territory infarct, likely due to large vessel disease although cardioembolic source not excluded  Consider 30 day cardiac event monitoring as outpt vs inpt loop placement on discharge   VTE prophylaxis - lovenox  No antithrombotic prior to admission, now on ASA 325 and plavix DAPT for 3 months and then ASA alone given significant intracranial stenosis  Palliative consult 04/23/19 - Full Code - plan OP referral to Hospice. Continue to follow pt as an IP, routine attempts to contact family.  Therapy recommendations: SNF   Disposition:  Pending - pt without insurance coverage - SW continues to be involved   Medically ready for d/c once d/c plan determined.   Hypertension  Off lisinopril due to elevated Cre  On norvasc 5 -> 10 mg  On labetalol 100mg  bid . SBP stable 140s . BP goal normotensive  Hyperlipidemia  LDL 113, goal < 70  Current lipid lowering medication: on lipitor 40  Continue statin at discharge  Dysphagia . Secondary to stroke . D1 thin liquids->D2 thin liquids . Speech on board . Pt with good po intake eating 100% meals and snacks . On IVF at 50/h  AKI on CKD 3a   Cre 1.28  Have repetitively stopped IVF after staff education to encourage pos but Cre always goes up  Continue NS 50cc/hr   Other Stroke  Risk Factors  Advanced age  Hx stroke/TIA - by imaging  Other Active Problems  Dementia - on galantamine and Risperdal   Hyponatremia - 136 - resolved  Dressing to L groin raw area.  Hospital day # 45  Annie Main, MSN, APRN, ANVP-BC, AGPCNP-BC Advanced Practice Stroke Nurse [facility] See Amion for Schedule & Pager information 05/31/2019 2:32 PM      To contact Stroke Continuity provider, please refer to [email or web address]. After hours, contact General Neurology

## 2019-05-31 NOTE — Progress Notes (Signed)
Occupational Therapy Treatment Patient Details Name: Scott Gilbert MRN: [id number] DOB: 1929-05-24 Today's Date: 05/31/2019    History of present illness 86 y.o. male with past medical history significant for hypertension and mild dementia. He presented to the emergency department with sudden onset of slurred speech and aphasia, facial droop and weakness.   CT cerebral perfusion on 04/16/19 reported: "acute distal Left ACA territory ischemia and infarct."   OT comments  Pt tolerated sink level standing grooming task with activation of R hand appropriately for hand hygiene. Pt using L Ue during session to help (A) R hand multiple times. Pt with narrowed base of support with R LE and continues to require KI R LE due to buckle.    Follow Up Recommendations  Supervision/Assistance - 24 hour;SNF    Equipment Recommendations  Wheelchair (measurements OT);Wheelchair cushion (measurements OT);3 in 1 bedside commode    Recommendations for Other Services      Precautions / Restrictions Precautions Precautions: Fall Precaution Comments: Utilized KI for gt training and ace wrapped foot into dorsiflexion. Required Braces or Orthoses: Knee Immobilizer - Right;Other Brace Knee Immobilizer - Right: On when out of bed or walking Other Brace: R UE resting hand splint in room for use at night       Mobility Bed Mobility Overal bed mobility: Needs Assistance Bed Mobility: Supine to Sit Rolling: Mod assist   Supine to sit: Min assist     General bed mobility comments: pt noted to be wet on arrival due to pulling off male cather bag so incontinent on arrival.   Transfers Overall transfer level: Needs assistance Equipment used: Rolling walker (2 wheeled) Transfers: Sit to/from Stand Sit to Stand: Mod assist Stand pivot transfers: +2 physical assistance;Mod assist       General transfer comment: Pt requires (A) to position R LE. pt noted to have toes turned outward and unable to sustain a  neutral foward toe foot position. pt requires (A) to advance R LE but does attempt to move R LE on command. pt with scissor gait    Balance Overall balance assessment: Needs assistance         Standing balance support: Bilateral upper extremity supported;During functional activity Standing balance-Leahy Scale: Poor                             ADL either performed or assessed with clinical judgement   ADL Overall ADL's : Needs assistance/impaired     Grooming: Wash/dry hands;Maximal assistance;Standing Grooming Details (indicate cue type and reason): requires total+2 Max (A) for static standign at sink with R LE narrowed base of support initially. pt needs hand over hand to initiate task. pt then perseverates on drying hands and all the sink surface             Lower Body Dressing: Total assistance                 General ADL Comments: pt demonstrates ankle flexion with tingling of R foot. pt giggled in response. ace wrap dced part way through transfers at a rest break to attempt to let patient ankle flex.      Vision       Perception     Praxis      Cognition Arousal/Alertness: Awake/alert Behavior During Therapy: WFL for tasks assessed/performed Overall Cognitive Status: Difficult to assess  General Comments: pt responds  Yes when provided yes no questions and then states "walk " to help reinforce yes answer        Exercises     Shoulder Instructions       General Comments      Pertinent Vitals/ Pain       Pain Assessment: No/denies pain  Home Living                                          Prior Functioning/Environment              Frequency  Min 2X/week        Progress Toward Goals  OT Goals(current goals can now be found in the care plan section)  Progress towards OT goals: Progressing toward goals  Acute Rehab OT Goals Patient Stated Goal: walk OT  Goal Formulation: Patient unable to participate in goal setting Time For Goal Achievement: 06/08/19 Potential to Achieve Goals: Good ADL Goals Pt Will Perform Eating: with min guard assist;sitting Pt Will Perform Grooming: with min guard assist;sitting Pt Will Perform Upper Body Dressing: with min assist;sitting Pt Will Transfer to Toilet: with min assist;ambulating;bedside commode Pt Will Perform Toileting - Clothing Manipulation and hygiene: with min assist;sitting/lateral leans;sit to/from stand Pt/caregiver will Perform Home Exercise Program: Increased strength;Right Upper extremity;Increased ROM;With minimal assist Additional ADL Goal #1: Pt will participate in x3 mins of ADL task with moderate cueing to attend.  Plan Discharge plan remains appropriate    Co-evaluation    PT/OT/SLP Co-Evaluation/Treatment: Yes Reason for Co-Treatment: Complexity of the patient's impairments (multi-system involvement);For patient/therapist safety;To address functional/ADL transfers   OT goals addressed during session: ADL's and self-care;Proper use of Adaptive equipment and DME;Strengthening/ROM      AM-PAC OT "6 Clicks" Daily Activity     Outcome Measure   Help from another person eating meals?: A Lot Help from another person taking care of personal grooming?: A Little Help from another person toileting, which includes using toliet, bedpan, or urinal?: A Lot Help from another person bathing (including washing, rinsing, drying)?: A Lot Help from another person to put on and taking off regular upper body clothing?: A Lot Help from another person to put on and taking off regular lower body clothing?: A Lot 6 Click Score: 13    End of Session Equipment Utilized During Treatment: Gait belt;Rolling walker  OT Visit Diagnosis: Unsteadiness on feet (R26.81);Muscle weakness (generalized) (M62.81)   Activity Tolerance Patient tolerated treatment well   Patient Left in chair;with call bell/phone  within reach;with chair alarm set   Nurse Communication Mobility status;Precautions        Time: 1423(1423)-1456 OT Time Calculation (min): 33 min  Charges: OT General Charges $OT Visit: 1 Visit OT Treatments $Self Care/Home Management : 8-22 mins   Brynn, OTR/L  Acute Rehabilitation Services Pager: [phone] Office: [phone] .    Jeri Modena 05/31/2019, 3:23 PM

## 2019-05-31 NOTE — Progress Notes (Addendum)
Physical Therapy Treatment Patient Details Name: Scott Gilbert MRN: [id number] DOB: Jun 18, 1929 Today's Date: 05/31/2019    History of Present Illness 86 y.o. male with past medical history significant for hypertension and mild dementia. He presented to the emergency department with sudden onset of slurred speech and aphasia, facial droop and weakness.   CT cerebral perfusion on 04/16/19 reported: "acute distal Left ACA territory ischemia and infarct."    PT Comments    Pt performed gt training and functional mobility with +2 assistance.  He continues to lack strength and coordination with movement.  Continue to recommend snf placement.     Follow Up Recommendations  Supervision/Assistance - 24 hour;SNF     Equipment Recommendations  Wheelchair (measurements PT)    Recommendations for Other Services Rehab consult     Precautions / Restrictions Precautions Precautions: Fall Precaution Comments: Utilized KI for gt training and ace wrapped foot into dorsiflexion. Required Braces or Orthoses: Knee Immobilizer - Right;Other Brace Knee Immobilizer - Right: On when out of bed or walking Other Brace: R UE resting hand splint in room for use at night    Mobility  Bed Mobility Overal bed mobility: Needs Assistance Bed Mobility: Sit to Supine;Supine to Sit Rolling: Mod assist   Supine to sit: Min assist     General bed mobility comments: Pt required increased time to move to edge of bed and elevate trunk into a seated position.  He was noted to be incontinent of urine as primo fit bag was slef removed.  Transfers Overall transfer level: Needs assistance Equipment used: Rolling walker (2 wheeled) Transfers: Sit to/from Stand Sit to Stand: Mod assist;+2 safety/equipment Stand pivot transfers: Mod assist;+2 safety/equipment       General transfer comment: Pt requires (A) to position R LE. pt noted to have toes turned outward and unable to sustain a neutral foward toe foot  position. pt requires (A) to advance R LE but does attempt to move R LE on command. pt with scissor gait  Ambulation/Gait Ambulation/Gait assistance: Max assist Gait Distance (Feet): 10 Feet(x2) Assistive device: Rolling walker (2 wheeled) Gait Pattern/deviations: Step-to pattern;Shuffle;Ataxic;Trunk flexed;Decreased dorsiflexion - right     General Gait Details: OT assist wtih IV pole and chair follow; PT assist for balance, walker management, R LE progression, weight shift  and hip external rotation with KI and ace on R ankle. Pt with ER of R LE.   Stairs             Wheelchair Mobility    Modified Rankin (Stroke Patients Only) Modified Rankin (Stroke Patients Only) Pre-Morbid Rankin Score: No symptoms Modified Rankin: Moderately severe disability     Balance Overall balance assessment: Needs assistance   Sitting balance-Leahy Scale: Fair     Standing balance support: Bilateral upper extremity supported;During functional activity Standing balance-Leahy Scale: Poor                              Cognition Arousal/Alertness: Awake/alert Behavior During Therapy: WFL for tasks assessed/performed Overall Cognitive Status: Difficult to assess                                 General Comments: pt responds  Yes when provided yes no questions and then states "walk " to help reinforce yes answer      Exercises      General Comments  Pertinent Vitals/Pain Pain Assessment: 0-10 Pain Score: 5  Faces Pain Scale: Hurts little more Pain Location: with RUE ROm and R HS with stretching Pain Descriptors / Indicators: Tightness Pain Intervention(s): Monitored during session;Repositioned    Home Living                      Prior Function            PT Goals (current goals can now be found in the care plan section) Acute Rehab PT Goals Patient Stated Goal: walk Potential to Achieve Goals: Fair Progress towards PT goals:  Progressing toward goals    Frequency    Min 3X/week      PT Plan Current plan remains appropriate    Co-evaluation PT/OT/SLP Co-Evaluation/Treatment: Yes Reason for Co-Treatment: Complexity of the patient's impairments (multi-system involvement);For patient/therapist safety PT goals addressed during session: Mobility/safety with mobility OT goals addressed during session: ADL's and self-care      AM-PAC PT "6 Clicks" Mobility   Outcome Measure                   End of Session Equipment Utilized During Treatment: Gait belt Activity Tolerance: Patient tolerated treatment well Patient left: with chair alarm set;in chair;with call bell/phone within reach Nurse Communication: Mobility status PT Visit Diagnosis: Other abnormalities of gait and mobility (R26.89);Hemiplegia and hemiparesis;Muscle weakness (generalized) (M62.81) Hemiplegia - Right/Left: Right Hemiplegia - dominant/non-dominant: Dominant Hemiplegia - caused by: Cerebral infarction     Time: 9622-2979 PT Time Calculation (min) (ACUTE ONLY): 33 min  Charges:  $Gait Training: 8-22 mins                     Erasmo Leventhal , PTA Acute Rehabilitation Services Pager [phone] Office [phone]     Andru Genter [name] 05/31/2019, 6:46 PM

## 2019-06-01 NOTE — Progress Notes (Signed)
STROKE TEAM PROGRESS NOTE   INTERVAL HISTORY Pt napping in his bed. Stated he just finished breakfast. Lunch now waiting for me.  "Good to see you" he said on my leaving.    OBJECTIVE Vitals:   06/01/19 0003 06/01/19 0437 06/01/19 0844 06/01/19 1158  BP: (!) 126/55 (!) 144/62 (!) 156/75 131/60  Pulse: 75 81 78 71  Resp: 16 19 20 17   Temp: 98.1 F (36.7 C) (!) 97.5 F (36.4 C) 98.3 F (36.8 C) 98.7 F (37.1 C)  TempSrc: Oral Oral Oral Oral  SpO2: 96% 95% 97% 95%  Weight:       CBC:  Recent Labs  Lab 05/26/19 0456 05/28/19 0446  WBC 6.2 6.9  HGB 11.7* 11.6*  HCT 33.6* 33.8*  MCV 90.8 93.1  PLT 225 235   Basic Metabolic Panel:  Recent Labs  Lab 05/28/19 0446 05/31/19 0416  NA 135 136  K 4.1 4.2  CL 101 101  CO2 23 25  GLUCOSE 105* 99  BUN 27* 21  CREATININE 1.36* 1.28*  CALCIUM 8.6* 8.7*    IMAGING past 24h No results found.   PHYSICAL EXAM    - no change General - Well nourished, well developed elderly caucasian male, in no apparent distress.    Cardiovascular - Regular rhythm and rate.  Neuro - awake alert, making eye contact, tracking bilaterally. Able to simple commands on the L like raise arm with visual cues. Able to laugh, and able to say his name and how he is doing. Blinking to visual threat bilaterally. Right facial droop.  Tongue midline.  Left upper extremity 4/5, left lower extremity at least 3/5.  Right upper extremity 3/5 with significantly increased muscle tone with hand clenched, and right lower extremity withdrawal on pain stimulation 2+/5 with increased muscle tone.  No Babinski. Sensation, coordination and gait not tested. Global bradykinesia and rigidity. Reddened raw area inner L thigh at groin.   ASSESSMENT/PLAN [name] is a 86 y.o. male with history of hypertension and  mild dementia (medical care at [facility]), presents to the emergency department with sudden onset of slurred speech, aphasia, left gaze preference with right-sided  neglect, right hemiparesis, facial droop and weakness. He received IV t-PA Friday 04/16/19 at 2230.  Stroke: Left ACA territory infarct, likely due to large vessel disease although cardioembolic source not excluded  Consider 30 day cardiac event monitoring as outpt vs inpt loop placement on discharge   VTE prophylaxis - lovenox  No antithrombotic prior to admission, now on ASA 325 and plavix DAPT for 3 months and then ASA alone given significant intracranial stenosis  Palliative consult 04/23/19 - Full Code - plan OP referral to Hospice. Continue to follow pt as an IP, routine attempts to contact family.  Therapy recommendations: SNF   Disposition:  Pending - pt without insurance coverage - SW continues to be involved   Medically ready for d/c once d/c plan determined.   Hypertension  Off lisinopril due to elevated Cre  On norvasc 5 -> 10 mg  On labetalol 100mg  bid . BP stable . BP goal normotensive  Hyperlipidemia  LDL 113, goal < 70  Current lipid lowering medication: on lipitor 40  Continue statin at discharge  Dysphagia . Secondary to stroke . D1 thin liquids->D2 thin liquids . Speech on board . Pt with good po intake eating 100% meals and snacks . On IVF at 50/h  AKI on CKD 3a   Cre 1.28  Have repetitively stopped  IVF after staff education to encourage pos but Cre always goes up  Continue NS 50cc/hr   Other Stroke Risk Factors  Advanced age  Hx stroke/TIA - by imaging  Other Active Problems  Dementia - on galantamine and Risperdal   Hyponatremia - 136 - resolved  Dressing to L groin raw area 3/15 - f/u in a couple of days to confirm healing  Hospital day # [place], MSN, APRN, ANVP-BC, AGPCNP-BC Advanced Practice Stroke Nurse [place] for Schedule & Pager information 06/01/2019 1:29 PM      To contact Stroke Continuity provider, please refer to [email or web address] After hours, contact General Neurology

## 2019-06-02 LAB — CBC
HCT: 33.4 % — ABNORMAL LOW (ref 39.0–52.0)
Hemoglobin: 11.4 g/dL — ABNORMAL LOW (ref 13.0–17.0)
MCH: 31.3 pg (ref 26.0–34.0)
MCHC: 34.1 g/dL (ref 30.0–36.0)
MCV: 91.8 fL (ref 80.0–100.0)
Platelets: 215 10*3/uL (ref 150–400)
RBC: 3.64 MIL/uL — ABNORMAL LOW (ref 4.22–5.81)
RDW: 12.5 % (ref 11.5–15.5)
WBC: 6 10*3/uL (ref 4.0–10.5)
nRBC: 0 % (ref 0.0–0.2)

## 2019-06-02 LAB — BASIC METABOLIC PANEL
Anion gap: 11 (ref 5–15)
BUN: 22 mg/dL (ref 8–23)
CO2: 23 mmol/L (ref 22–32)
Calcium: 8.6 mg/dL — ABNORMAL LOW (ref 8.9–10.3)
Chloride: 101 mmol/L (ref 98–111)
Creatinine, Ser: 1.17 mg/dL (ref 0.61–1.24)
GFR calc Af Amer: >60 mL/min (ref >60)
GFR calc non Af Amer: 56 mL/min — ABNORMAL LOW (ref >60)
Glucose, Bld: 97 mg/dL (ref 70–99)
Potassium: 3.8 mmol/L (ref 3.5–5.1)
Sodium: 135 mmol/L (ref 135–145)

## 2019-06-02 NOTE — Progress Notes (Signed)
STROKE TEAM PROGRESS NOTE   INTERVAL HISTORY Patient up in the chair. Speech becoming more spontaneous and faster response with motor movement requests.  OBJECTIVE Vitals:   06/02/19 0015 06/02/19 0335 06/02/19 0728 06/02/19 1242  BP: (!) 124/56 (!) 127/56 (!) 144/55 136/60  Pulse: 62 67 71 72  Resp: 17 17 16 18   Temp: 98.2 F (36.8 C) 98.5 F (36.9 C) 99.1 F (37.3 C) 98.2 F (36.8 C)  TempSrc:  Oral Oral Axillary  SpO2:   99% 100%  Weight:       CBC:  Recent Labs  Lab 05/28/19 0446 06/02/19 0417  WBC 6.9 6.0  HGB 11.6* 11.4*  HCT 33.8* 33.4*  MCV 93.1 91.8  PLT 235 215   Basic Metabolic Panel:  Recent Labs  Lab 05/31/19 0416 06/02/19 0417  NA 136 135  K 4.2 3.8  CL 101 101  CO2 25 23  GLUCOSE 99 97  BUN 21 22  CREATININE 1.28* 1.17  CALCIUM 8.7* 8.6*    IMAGING past 24h No results found.   PHYSICAL EXAM     General - Well nourished, well developed elderly caucasian male, in no apparent distress.    Cardiovascular - Regular rhythm and rate.  Neuro - awake alert, making eye contact, tracking bilaterally. Able to follow simple commands - able to raise B arms at request. Able to laugh, and able to say his name and how he is doing. More rapid response with multiple verbal questions. Blinking to visual threat bilaterally. Right facial droop.  Tongue midline.  Left upper extremity 4/5, left lower extremity at least 3/5.  Right upper extremity 3/5 with significantly increased muscle tone with hand clenched, and right lower extremity withdrawal on pain stimulation 2+/5 with increased muscle tone.  No Babinski. Sensation, coordination and gait not tested. Global bradykinesia and rigidity. Reddened raw area inner L thigh at groin.   ASSESSMENT/PLAN [name] is a 86 y.o. male with history of hypertension and  mild dementia (medical care at [facility]), presents to the emergency department with sudden onset of slurred speech, aphasia, left gaze preference with  right-sided neglect, right hemiparesis, facial droop and weakness. He received IV t-PA Friday 04/16/19 at 2230.  Stroke: Left ACA territory infarct, likely due to large vessel disease although cardioembolic source not excluded  Consider 30 day cardiac event monitoring as outpt vs inpt loop placement on discharge   VTE prophylaxis - lovenox  No antithrombotic prior to admission, now on ASA 325 and plavix DAPT for 3 months and then ASA alone given significant intracranial stenosis  Palliative consult 04/23/19 - Full Code - plan OP referral to Hospice. Continue to follow pt as an IP, routine attempts to contact family.  Therapy recommendations: SNF   Disposition:  Pending - pt without insurance coverage - SW continues to be involved   Medically ready for d/c once d/c plan determined.   Hypertension  Off lisinopril due to elevated Cre  On norvasc 5 -> 10 mg  On labetalol 100mg  bid . BP stable . BP goal normotensive  Hyperlipidemia  LDL 113, goal < 70  Current lipid lowering medication: on lipitor 40  Continue statin at discharge  Dysphagia . Secondary to stroke . D1 thin liquids->D2 thin liquids . Speech on board . Pt with good po intake eating 100% meals and snacks . On IVF at 50/h  AKI on CKD 3a   Cre 1.17  Have repetitively stopped IVF after staff education to encourage pos  but Cre always goes up  Continue NS 50cc/hr   Other Stroke Risk Factors  Advanced age  Hx stroke/TIA - by imaging  Other Active Problems  Dementia - on galantamine and Risperdal   Hyponatremia - 136 - resolved  Dressing to L groin raw area 3/15 - f/u in a couple of days to confirm healing  Hospital day # [place], MSN, APRN, ANVP-BC, AGPCNP-BC Advanced Practice Stroke Nurse [place] for Schedule & Pager information 06/02/2019 2:10 PM      To contact Stroke Continuity provider, please refer to [email or web address] After hours, contact General  Neurology

## 2019-06-02 NOTE — Progress Notes (Signed)
Physical Therapy Treatment Patient Details Name: Scott Gilbert MRN: [id number] DOB: 14-Oct-1929 Today's Date: 06/02/2019    History of Present Illness 86 y.o. male with past medical history significant for hypertension and mild dementia. He presented to the emergency department with sudden onset of slurred speech and aphasia, facial droop and weakness.   CT cerebral perfusion on 04/16/19 reported: "acute distal Left ACA territory ischemia and infarct."    PT Comments    Pt supine in bed on arrival.Pt required application of Knee immobilizer.  PTA refitted brace for improved function and he required decreased assistance to mobilize this session.  Continue to recommend snf placement at this time.    Follow Up Recommendations  Supervision/Assistance - 24 hour;SNF     Equipment Recommendations  Wheelchair (measurements PT)    Recommendations for Other Services Rehab consult     Precautions / Restrictions Precautions Precautions: Fall Precaution Comments: Utilized KI for gt training and ace wrapped foot into dorsiflexion. Required Braces or Orthoses: Knee Immobilizer - Right;Other Brace Knee Immobilizer - Right: On when out of bed or walking Restrictions Weight Bearing Restrictions: No    Mobility  Bed Mobility Overal bed mobility: Needs Assistance Bed Mobility: Sit to Supine;Supine to Sit     Supine to sit: Mod assist;+2 for physical assistance     General bed mobility comments: Pt required increased time to move to edge of bed and elevate trunk into a seated position.  Assistance for LE advance and trunk elevation  Transfers Overall transfer level: Needs assistance Equipment used: Rolling walker (2 wheeled) Transfers: Sit to/from Stand Sit to Stand: Mod assist;+2 safety/equipment         General transfer comment: Cues for hand placement and hand over hand on R side to move into standing.  Ambulation/Gait Ambulation/Gait assistance: Mod assist;+2  safety/equipment Gait Distance (Feet): 15 Feet(x2) Assistive device: Rolling walker (2 wheeled) Gait Pattern/deviations: Step-to pattern;Shuffle;Ataxic;Trunk flexed;Decreased dorsiflexion - right Gait velocity: decreased   General Gait Details: Pt required close chair follow , assistance to maintain path of RW and advance RLE forward.  Pt noted with more neutral alignment of RLE this session.  Brace very tight this session which limited buckling in stance phase.  Poor gt symmtery noted.   Stairs             Wheelchair Mobility    Modified Rankin (Stroke Patients Only) Modified Rankin (Stroke Patients Only) Pre-Morbid Rankin Score: No symptoms Modified Rankin: Moderately severe disability     Balance Overall balance assessment: Needs assistance Sitting-balance support: Single extremity supported;Feet supported Sitting balance-Leahy Scale: Fair       Standing balance-Leahy Scale: Poor Standing balance comment: UE support and assist for balance                            Cognition Arousal/Alertness: Awake/alert Behavior During Therapy: WFL for tasks assessed/performed Overall Cognitive Status: Difficult to assess                                 General Comments: pt responds  Yes when provided yes no questions and then states "walk " to help reinforce yes answer      Exercises Other Exercises Other Exercises: R LE HS HC stretch 1x 15 sec. Other Exercises: x5 heel slides with AAROM on RLE    General Comments        Pertinent Vitals/Pain  Pain Assessment: Faces Faces Pain Scale: No hurt    Home Living                      Prior Function            PT Goals (current goals can now be found in the care plan section) Acute Rehab PT Goals Patient Stated Goal: walk PT Goal Formulation: Patient unable to participate in goal setting Potential to Achieve Goals: Fair Progress towards PT goals: Progressing toward goals     Frequency    Min 3X/week      PT Plan Current plan remains appropriate    Co-evaluation              AM-PAC PT "6 Clicks" Mobility   Outcome Measure  Help needed turning from your back to your side while in a flat bed without using bedrails?: A Lot Help needed moving from lying on your back to sitting on the side of a flat bed without using bedrails?: A Lot Help needed moving to and from a bed to a chair (including a wheelchair)?: A Lot Help needed standing up from a chair using your arms (e.g., wheelchair or bedside chair)?: A Lot Help needed to walk in hospital room?: A Lot Help needed climbing 3-5 steps with a railing? : Total 6 Click Score: 11    End of Session Equipment Utilized During Treatment: Gait belt Activity Tolerance: Patient tolerated treatment well Patient left: with chair alarm set;in chair;with call bell/phone within reach(sitter alarm belt applied) Nurse Communication: Mobility status PT Visit Diagnosis: Other abnormalities of gait and mobility (R26.89);Hemiplegia and hemiparesis;Muscle weakness (generalized) (M62.81) Hemiplegia - Right/Left: Right Hemiplegia - dominant/non-dominant: Dominant Hemiplegia - caused by: Cerebral infarction     Time: 0814-4818 PT Time Calculation (min) (ACUTE ONLY): 27 min  Charges:  $Gait Training: 8-22 mins $Therapeutic Activity: 8-22 mins                    Erasmo Leventhal , PTA Acute Rehabilitation Services Pager [phone] Office [phone]     Lewanda Perea [name] 06/02/2019, 12:44 PM

## 2019-06-02 NOTE — Progress Notes (Signed)
Occupational Therapy Treatment Patient Details Name: Scott Gilbert MRN: [id number] DOB: February 18, 1930 Today's Date: 06/02/2019    History of present illness 86 y.o. male with past medical history significant for hypertension and mild dementia. He presented to the emergency department with sudden onset of slurred speech and aphasia, facial droop and weakness.   CT cerebral perfusion on 04/16/19 reported: "acute distal Left ACA territory ischemia and infarct."   OT comments  Pt continued to demonstrate decrease awareness to R hand holding objects or linens. Pt will with cues attempt to use LUE to remove items from R hand. AAROM and PROM provided to R UE this session with decreased wrist flexion noted. Continue to recommend SNF level care.    Follow Up Recommendations  Supervision/Assistance - 24 hour;SNF    Equipment Recommendations  Wheelchair (measurements OT);Wheelchair cushion (measurements OT);3 in 1 bedside commode    Recommendations for Other Services      Precautions / Restrictions Precautions Precautions: Fall Precaution Comments: Utilized KI for gt training and ace wrapped foot into dorsiflexion. Required Braces or Orthoses: Knee Immobilizer - Right;Other Brace Knee Immobilizer - Right: On when out of bed or walking Other Brace: R UE resting hand splint in room for use at night Restrictions Weight Bearing Restrictions: No       Mobility Bed Mobility               General bed mobility comments: bed level with HOB elevated  Transfers                      Balance                                           ADL either performed or assessed with clinical judgement   ADL Overall ADL's : Needs assistance/impaired Eating/Feeding: Supervision/ safety;Bed level Eating/Feeding Details (indicate cue type and reason): drinking water and asked to pass the cup to OT if he wants more. pt handing cup to OT . OT fillling up cup and handing cup back to  patient. pt drinking appropriately with straw. pt thirsty at this time and drinking ~ 16 oz total Grooming: Oral care;Moderate assistance Grooming Details (indicate cue type and reason): pt with decreased automatic response to adl items at sink level compared to bed level. pt needed increased cues to iniitate and use properly.                                General ADL Comments: bed level activity completed as patient jsut returned to bed per RN.     Vision       Perception     Praxis      Cognition Arousal/Alertness: Awake/alert Behavior During Therapy: WFL for tasks assessed/performed Overall Cognitive Status: History of cognitive impairments - at baseline                                 General Comments: responds hey and giggles in response to therapist throughout session        Exercises Exercises: Other exercises Other Exercises Other Exercises: wrist flexion/ extension, abduction of digits, elbow flexion extension, shoulder abduction   Shoulder Instructions       General Comments  Pertinent Vitals/ Pain       Pain Assessment: No/denies pain  Home Living                                          Prior Functioning/Environment              Frequency  Min 2X/week        Progress Toward Goals  OT Goals(current goals can now be found in the care plan section)  Progress towards OT goals: Progressing toward goals  Acute Rehab OT Goals Patient Stated Goal: none stated OT Goal Formulation: Patient unable to participate in goal setting Time For Goal Achievement: 06/08/19 Potential to Achieve Goals: Good ADL Goals Pt Will Perform Eating: with min guard assist;sitting Pt Will Perform Grooming: with min guard assist;sitting Pt Will Perform Upper Body Dressing: with min assist;sitting Pt Will Transfer to Toilet: with min assist;ambulating;bedside commode Pt Will Perform Toileting - Clothing Manipulation and  hygiene: with min assist;sitting/lateral leans;sit to/from stand Pt/caregiver will Perform Home Exercise Program: Increased strength;Right Upper extremity;Increased ROM;With minimal assist Additional ADL Goal #1: Pt will participate in x3 mins of ADL task with moderate cueing to attend.  Plan Discharge plan remains appropriate    Co-evaluation                 AM-PAC OT "6 Clicks" Daily Activity     Outcome Measure   Help from another person eating meals?: A Lot Help from another person taking care of personal grooming?: A Little Help from another person toileting, which includes using toliet, bedpan, or urinal?: A Lot Help from another person bathing (including washing, rinsing, drying)?: A Lot Help from another person to put on and taking off regular upper body clothing?: A Lot Help from another person to put on and taking off regular lower body clothing?: A Lot 6 Click Score: 13    End of Session    OT Visit Diagnosis: Unsteadiness on feet (R26.81)   Activity Tolerance Patient tolerated treatment well   Patient Left in bed;with call bell/phone within reach;with bed alarm set;with restraints reapplied   Nurse Communication Mobility status;Precautions        Time: 4097-3532 OT Time Calculation (min): 18 min  Charges: OT General Charges $OT Visit: 1 Visit OT Treatments $Therapeutic Exercise: 8-22 mins   Scott Gilbert, OTR/L  Acute Rehabilitation Services Pager: [phone] Office: [phone] .    Mateo Flow 06/02/2019, 5:02 PM

## 2019-06-03 NOTE — Progress Notes (Signed)
STROKE TEAM PROGRESS NOTE   INTERVAL HISTORY No new issues. Still awaitng d/c  OBJECTIVE Vitals:   06/03/19 0408 06/03/19 0844 06/03/19 1131 06/03/19 1135  BP: (!) 173/74 (!) 157/63 (!) 149/64   Pulse: 74 72 67   Resp: 20 20 20    Temp: 97.7 F (36.5 C) 97.8 F (36.6 C)  99.2 F (37.3 C)  TempSrc: Oral Axillary Oral Oral  SpO2: 95%  100%   Weight:       CBC:  Recent Labs  Lab 05/28/19 0446 06/02/19 0417  WBC 6.9 6.0  HGB 11.6* 11.4*  HCT 33.8* 33.4*  MCV 93.1 91.8  PLT 235 215   Basic Metabolic Panel:  Recent Labs  Lab 05/31/19 0416 06/02/19 0417  NA 136 135  K 4.2 3.8  CL 101 101  CO2 25 23  GLUCOSE 99 97  BUN 21 22  CREATININE 1.28* 1.17  CALCIUM 8.7* 8.6*    IMAGING past 24h No results found.   PHYSICAL EXAM    - no change General - Well nourished, well developed elderly caucasian male, in no apparent distress.    Cardiovascular - Regular rhythm and rate.  Neuro - awake alert, making eye contact, tracking bilaterally. Able to simple commands on the L like raise arm with visual cues. Able to laugh, and able to say his name and how he is doing. Blinking to visual threat bilaterally. Right facial droop.  Tongue midline.  Left upper extremity 4/5, left lower extremity at least 3/5.  Right upper extremity 3/5 with significantly increased muscle tone with hand clenched, and right lower extremity withdrawal on pain stimulation 2+/5 with increased muscle tone.  No Babinski. Sensation, coordination and gait not tested. Global bradykinesia and rigidity. Reddened raw area inner L thigh at groin, no exudate or dressing at this time.   ASSESSMENT/PLAN [name] is a 86 y.o. male with history of hypertension and  mild dementia (medical care at [facility]), presents to the emergency department with sudden onset of slurred speech, aphasia, left gaze preference with right-sided neglect, right hemiparesis, facial droop and weakness. He received IV t-PA Friday 04/16/19 at  2230.  Stroke: Left ACA territory infarct, likely due to large vessel disease although cardioembolic source not excluded  Consider 30 day cardiac event monitoring as outpt vs inpt loop placement on discharge   VTE prophylaxis - lovenox  No antithrombotic prior to admission, now on ASA 325 and plavix DAPT for 3 months and then ASA alone given significant intracranial stenosis  Palliative consult 04/23/19 - Full Code - plan OP referral to Hospice. Continue to follow pt as an IP, routine attempts to contact family.  Therapy recommendations: SNF   Disposition:  Pending - pt without insurance coverage - SW continues to be involved   Medically ready for d/c once d/c plan determined.   Hypertension  Off lisinopril due to elevated Cre  On norvasc 5 -> 10 mg  On labetalol 100mg  bid . BP stable . BP goal normotensive  Hyperlipidemia  LDL 113, goal < 70  Current lipid lowering medication: on lipitor 40  Continue statin at discharge  Dysphagia . Secondary to stroke . D1 thin liquids->D2 thin liquids . Speech on board . Pt with good po intake eating 100% meals and snacks . On IVF at 50/h  AKI on CKD 3a   Cre 1.17  Have repetitively stopped IVF after staff education to encourage PO, but Cre always goes up  Continue NS 50cc/hr  Other Stroke Risk Factors  Advanced age  Hx stroke/TIA - by imaging  Other Active Problems  Dementia - on galantamine and Risperdal   Hyponatremia - 136 - resolved  Dressing removed to L groin 3/18; still red, but no exudate.   Hospital day # 20  [place], MSN, APRN, ANVP-BC Advanced Practice Stroke Nurse [facility] for Schedule & Pager information 06/03/2019 12:03 PM      To contact Stroke Continuity provider, please refer to [email or web address] After hours, contact General Neurology

## 2019-06-03 NOTE — Progress Notes (Signed)
Occupational Therapy Treatment Patient Details Name: Scott Gilbert MRN: [id number] DOB: [birth year] Today's Date: 06/03/2019    History of present illness 86 y.o. male with past medical history significant for hypertension and mild dementia. He presented to the emergency department with sudden onset of slurred speech and aphasia, facial droop and weakness.   CT cerebral perfusion on 04/16/19 reported: "acute distal Left ACA territory ischemia and infarct."   OT comments  Pt supine in bed with meal tray recently delivered upon OT arrival. Removed bilateral mitts, however pt began pulling at IV and catheter. Mitts donned with pt requiring increased assist for bed mobility. Pt tolerated sitting EOB 10 min with variable supervision to min assist. Noted 2 instances of left lateral LOB with pt requiring assist to self-correct. Educated pt on safety strategies and fall prevention and pt was attempting to stand and transfer to chair without assist. Pt noted to be incontinent of bowel in standing with pt requiring total assist for peri care. Pt able to stand pivot with max assist x 2 to bedside chair requiring cues for sequencing. Pt setup in chair for breakfast. Pt nitially fed self with LUE, however intiated placing utensil into right hand. Pt utilized left hand to load food onto utensil, bringing food to mouth with right hand. Pt able to eat 4 small bites using right hand, however pt became distracted and began finger feeding self with left hand. Pt able to reach with right hand for cup, grasp, and bring to mouth. Pt uses left hand to stabilize drink. Pt unable to release cup from right hand requiring assist. Pt required mod to max cues throughout to take small bites and sips. OT will continue to follow acutely. Continued to recommend SNF placement for additional rehab prior to discharge home.    Follow Up Recommendations  Supervision/Assistance - 24 hour;SNF    Equipment Recommendations  Wheelchair  (measurements OT);Wheelchair cushion (measurements OT);3 in 1 bedside commode    Recommendations for Other Services      Precautions / Restrictions Precautions Precautions: Fall Precaution Comments: Utilized KI for gt training and ace wrapped foot into dorsiflexion. Required Braces or Orthoses: Knee Immobilizer - Right;Other Brace Knee Immobilizer - Right: On when out of bed or walking Other Brace: R UE resting hand splint in room for use at night Restrictions Weight Bearing Restrictions: No       Mobility Bed Mobility Overal bed mobility: Needs Assistance Bed Mobility: Supine to Sit     Supine to sit: Mod assist;HOB elevated     General bed mobility comments: Pt able to initiate reaching with hands to push up into sitting. Pt required assist with RLE and trunk.   Transfers Overall transfer level: Needs assistance Equipment used: 2 person hand held assist Transfers: Sit to/from [employer] Sit to Stand: Mod assist;Max assist(Assist x 1) Stand pivot transfers: Mod assist;Max assist;+2 physical assistance       General transfer comment: Cues for sequencing. Several instances of RLE buckling during transfer.     Balance Overall balance assessment: Needs assistance Sitting-balance support: Single extremity supported;Feet supported Sitting balance-Leahy Scale: Fair(Poor to fair) Sitting balance - Comments: Tolerated sitting EOB 10 min noting 2 instances of left lateral LOB with pt requiring assist to self-correct.     Standing balance-Leahy Scale: Poor                             ADL either performed or assessed  with clinical judgement   ADL Overall ADL's : Needs assistance/impaired Eating/Feeding: Minimal assistance;Sitting;Cueing for safety Eating/Feeding Details (indicate cue type and reason): While seated in bedside chair. Pt initially fed self with LUE, however intiated placing utensil into right hand. Pt utilized left hand to load food  onto utensil, bringing food to mouth with right hand. Pt able to eat 4 small bites using right hand, however pt became distracted and began finger feeding self with left hand. Pt able to reach with right hand for cup, grasp, and bring to mouth. Pt uses left hand to stabilize drink. Pt unable to release cup requiring assist. Pt required mod to max cues throughout to take small bites and sips.                          Toileting- Clothing Manipulation and Hygiene: Total assistance;+2 for physical assistance;Sit to/from stand Toileting - Clothing Manipulation Details (indicate cue type and reason): Pt noted to be incontinent of bowel upon standing. 2 person assist to assist pt with standing balance and perform peri care.      Functional mobility during ADLs: Maximal assistance;+2 for physical assistance;Cueing for sequencing;Moderate assistance General ADL Comments: Pt tolerated sitting EOB ~10 min with variable supervision to min assist. Noted 2 instances of left lateral LOB with pt requiring assist to self-correct. Pt transferred to bedside chair with max assist x 2.      Vision       Perception     Praxis      Cognition Arousal/Alertness: Awake/alert Behavior During Therapy: Restless(Bilateral hand mitts in place) Overall Cognitive Status: History of cognitive impairments - at baseline                                 General Comments: Pt able to state first and last name. Quiet voice. Occasionally giggles in response to therapist.         Exercises     Shoulder Instructions       General Comments VSS. Encouraged visual scanning to right while seated EOB and while engaging in self-feeding task. Pt attempting to incorporate RUE into tasks.     Pertinent Vitals/ Pain       Pain Assessment: Faces Faces Pain Scale: No hurt  Home Living                                          Prior Functioning/Environment              Frequency            Progress Toward Goals  OT Goals(current goals can now be found in the care plan section)  Progress towards OT goals: Progressing toward goals  ADL Goals Pt Will Perform Eating: with min guard assist;sitting Pt Will Perform Grooming: with min guard assist;sitting Pt Will Perform Upper Body Dressing: with min assist;sitting Pt Will Transfer to Toilet: with min assist;ambulating;bedside commode Pt Will Perform Toileting - Clothing Manipulation and hygiene: with min assist;sitting/lateral leans;sit to/from stand Pt/caregiver will Perform Home Exercise Program: Increased strength;Right Upper extremity;Increased ROM;With minimal assist Additional ADL Goal #1: Pt will participate in x3 mins of ADL task with moderate cueing to attend.  Plan Discharge plan remains appropriate    Co-evaluation  AM-PAC OT "6 Clicks" Daily Activity     Outcome Measure   Help from another person eating meals?: A Lot Help from another person taking care of personal grooming?: A Little Help from another person toileting, which includes using toliet, bedpan, or urinal?: A Lot Help from another person bathing (including washing, rinsing, drying)?: A Lot Help from another person to put on and taking off regular upper body clothing?: A Lot Help from another person to put on and taking off regular lower body clothing?: A Lot 6 Click Score: 13    End of Session Equipment Utilized During Treatment: Gait belt  OT Visit Diagnosis: Unsteadiness on feet (R26.81);Feeding difficulties (R63.3)   Activity Tolerance Patient tolerated treatment well   Patient Left in chair;with call bell/phone within reach;with chair alarm set;with nursing/sitter in room   Nurse Communication Mobility status        Time: 5366-4403 OT Time Calculation (min): 41 min  Charges: OT General Charges $OT Visit: 1 Visit OT Treatments $Self Care/Home Management : 23-37 mins $Therapeutic Activity: 8-22  mins  Mauri Brooklyn OTR/L [phone]   Mauri Brooklyn 06/03/2019, 10:26 AM

## 2019-06-03 NOTE — TOC Progression Note (Signed)
Transition of Care [facility]) - Progression Note    Patient Details  Name: Scott Gilbert MRN: [id number] Date of Birth: 10-08-1929  [facility] of [facility] CM/SW Contact  Baldemar Lenis, [name] Phone Number: 06/03/2019, 2:56 PM  Clinical Narrative:   CSW spoke with patient's son-in-law, Gala Romney, with an update on guardianship. Gala Romney has petitioned guardianship with the courts, and there is a court date scheduled for 06/09/19. The sheriff will be to the hospital at some point to serve the patient the papers, and there will be an interim guardian ad litem while the guardianship is being established. Doug asked about any other assistance that CSW could provide to assist, and CSW indicated that the letter that was previously provided should suffice, but that Gala Romney can reach out to CSW if the courts ask for anything in addition. Gala Romney will keep CSW informed after the court hearing has occurred. CSW to follow.    Expected Discharge Plan: IP Rehab Facility Barriers to Discharge: Continued Medical Work up, Inadequate or no insurance  Expected Discharge Plan and Services Expected Discharge Plan: IP Rehab Facility       Living arrangements for the past 2 months: Single Family Home                                       Social Determinants of Health (SDOH) Interventions    Readmission Risk Interventions No flowsheet data found.

## 2019-06-04 NOTE — Progress Notes (Signed)
Physical Therapy Treatment Patient Details Name: Scott Gilbert MRN: [id number] DOB: 09/06/1929 Today's Date: 06/04/2019    History of Present Illness 86 y.o. male with past medical history significant for hypertension and mild dementia. He presented to the emergency department with sudden onset of slurred speech and aphasia, facial droop and weakness.   CT cerebral perfusion on 04/16/19 reported: "acute distal Left ACA territory ischemia and infarct."    PT Comments    Pt progressing slowly towards physical therapy goals; remains motivated to participate. Requiring mod-max assist for basic transfers. Continues with right hemiplegia and balance impairments. Continue to recommend SNF for ongoing Physical Therapy.     Follow Up Recommendations  Supervision/Assistance - 24 hour;SNF     Equipment Recommendations  Wheelchair (measurements PT)    Recommendations for Other Services       Precautions / Restrictions Precautions Precautions: Fall Precaution Comments: KI and ace wrap for gait training Required Braces or Orthoses: Knee Immobilizer - Right;Other Brace Knee Immobilizer - Right: On when out of bed or walking Other Brace: R UE resting hand splint in room for use at night Restrictions Weight Bearing Restrictions: No    Mobility  Bed Mobility Overal bed mobility: Needs Assistance Bed Mobility: Supine to Sit     Supine to sit: Mod assist;HOB elevated     General bed mobility comments: Pt able to initiate reaching with hands to push up into sitting. Pt required assist trunk and RLE  Transfers Overall transfer level: Needs assistance Equipment used: None Transfers: Sit to/from [employer] Sit to Stand: Mod assist Stand pivot transfers: Max assist       General transfer comment: ModA to stand from edge of bed x 3, maxA to pivot to chair  Ambulation/Gait                 Stairs             Wheelchair Mobility    Modified Rankin  (Stroke Patients Only)       Balance Overall balance assessment: Needs assistance Sitting-balance support: Single extremity supported;Feet supported Sitting balance-Leahy Scale: Fair(Poor to fair) Sitting balance - Comments: RUE on rail, LUE on bed. Supervision for safety     Standing balance-Leahy Scale: Poor                              Cognition Arousal/Alertness: Awake/alert Behavior During Therapy: Restless(Bilateral hand mitts in place) Overall Cognitive Status: History of cognitive impairments - at baseline                                 General Comments: Following 1 step commands inconsistently, laughing occasionally      Exercises General Exercises - Lower Extremity Long Arc Quad: Left;10 reps;Seated Hip Flexion/Marching: Left;10 reps;Seated Other Exercises Other Exercises: Sitting EOB: R hamstring, gastroc PROM     General Comments        Pertinent Vitals/Pain Pain Assessment: Faces Faces Pain Scale: No hurt    Home Living                      Prior Function            PT Goals (current goals can now be found in the care plan section) Acute Rehab PT Goals PT Goal Formulation: Patient unable to participate in goal setting Time For Goal  Achievement: 06/18/19 Potential to Achieve Goals: Fair    Frequency    Min 3X/week      PT Plan Current plan remains appropriate    Co-evaluation              AM-PAC PT "6 Clicks" Mobility   Outcome Measure  Help needed turning from your back to your side while in a flat bed without using bedrails?: A Lot   Help needed moving to and from a bed to a chair (including a wheelchair)?: A Lot Help needed standing up from a chair using your arms (e.g., wheelchair or bedside chair)?: A Lot Help needed to walk in hospital room?: A Lot Help needed climbing 3-5 steps with a railing? : Total 6 Click Score: 9    End of Session Equipment Utilized During Treatment: Gait  belt Activity Tolerance: Patient tolerated treatment well Patient left: in chair;with call bell/phone within reach;with chair alarm set Nurse Communication: Mobility status PT Visit Diagnosis: Other abnormalities of gait and mobility (R26.89);Hemiplegia and hemiparesis;Muscle weakness (generalized) (M62.81) Hemiplegia - Right/Left: Right Hemiplegia - dominant/non-dominant: Dominant Hemiplegia - caused by: Cerebral infarction     Time: 1341-1414 PT Time Calculation (min) (ACUTE ONLY): 33 min  Charges:  $Therapeutic Exercise: 8-22 mins $Therapeutic Activity: 8-22 mins                       Lillia Pauls, PT, DPT Acute Rehabilitation Services Pager [phone] Office [phone]    Norval Morton 06/04/2019, 5:22 PM

## 2019-06-04 NOTE — Progress Notes (Signed)
STROKE TEAM PROGRESS NOTE   INTERVAL HISTORY No new issues. Pt is resting comfortably in bed. The pts son has petitioned guardianship with the courts, and there is a court date scheduled for 06/09/19.  OBJECTIVE Vitals:   06/03/19 1531 06/03/19 2047 06/03/19 2352 06/04/19 0429  BP: (!) 158/91 (!) 145/64 119/76 (!) 133/50  Pulse: 78 75 67 65  Resp: 18 18 16 16   Temp: 98.1 F (36.7 C) 98 F (36.7 C) 98.6 F (37 C) 98 F (36.7 C)  TempSrc: Oral Oral Oral   SpO2: 97%     Weight:       CBC:  Recent Labs  Lab 06/02/19 0417  WBC 6.0  HGB 11.4*  HCT 33.4*  MCV 91.8  PLT 413   Basic Metabolic Panel:  Recent Labs  Lab 05/31/19 0416 06/02/19 0417  NA 136 135  K 4.2 3.8  CL 101 101  CO2 25 23  GLUCOSE 99 97  BUN 21 22  CREATININE 1.28* 1.17  CALCIUM 8.7* 8.6*    IMAGING past 24h No results found.   PHYSICAL EXAM    - no change General - Well nourished, well developed elderly caucasian male, in no apparent distress.    Cardiovascular - Regular rhythm and rate.  Neuro - awake alert, making eye contact, tracking bilaterally. Able to follow simple commands on the L like raise arm with visual cues. There is some automatic speech, says: "How are you?" Tells me his name, but no specifics otherwise. No insight to hospitalization. Blinking to visual threat bilaterally. Right facial droop.  Tongue midline.  Left upper extremity 4/5, left lower extremity at least 3/5.  Right upper extremity 3/5 with significantly increased muscle tone with hand clenched, and right lower extremity withdrawal on pain stimulation 2/5 with increased muscle tone.  No Babinski. Sensation, coordination and gait not tested. Global bradykinesia and rigidity. Reddened raw area inner L thigh at groin, no exudate or dressing at this time.   ASSESSMENT/PLAN [name] is a 86 y.o. male with history of hypertension and  mild dementia (medical care at [facility]), presents to the emergency department with sudden  onset of slurred speech, aphasia, left gaze preference with right-sided neglect, right hemiparesis, facial droop and weakness. He received IV t-PA Friday 04/16/19 at 2230.  Stroke: Left ACA territory infarct, likely due to large vessel disease although cardioembolic source not excluded  Consider 30 day cardiac event monitoring as outpt vs inpt loop placement on discharge   VTE prophylaxis - lovenox  No antithrombotic prior to admission, now on ASA 325 and plavix DAPT for 3 months and then ASA alone given significant intracranial stenosis  Palliative consult 04/23/19 - Full Code - plan OP referral to Hospice. Continue to follow pt as an IP, routine attempts to contact family.  Therapy recommendations: SNF   Disposition:  Pending - pt without insurance coverage - SW continues to be involved   Medically ready for d/c once d/c plan determined.   Hypertension  Off lisinopril due to elevated Cre  On norvasc 5 -> 10 mg  On labetalol 100mg  bid . BP stable . BP goal normotensive  Hyperlipidemia  LDL 113, goal < 70  Current lipid lowering medication: on lipitor 40  Continue statin at discharge  Dysphagia . Secondary to stroke . D1 thin liquids->D2 thin liquids . Speech on board . Pt with good po intake eating 100% meals and snacks . On IVF at 50/h  AKI on CKD 3a  Cre 1.17  Have repetitively stopped IVF after staff education to encourage PO, but Cre always goes up  Continue NS 50cc/hr   Other Stroke Risk Factors  Advanced age  Hx stroke/TIA - by imaging  Other Active Problems  Dementia - on galantamine and Risperdal   Hyponatremia - 136 - resolved  Dressing removed to L groin 3/18; still red, but no exudate.   Hospital day # 25  Scott Reth Metzger-Cihelka, MSN, APRN, ANVP-BC Advanced Practice Stroke Nurse [facility] See Amion for Schedule & Pager information 06/04/2019 9:24 AM      To contact Stroke Continuity provider, please refer to  [email or web address]. After hours, contact General Neurology

## 2019-06-05 LAB — CBC
HCT: 34 % — ABNORMAL LOW (ref 39.0–52.0)
Hemoglobin: 11.6 g/dL — ABNORMAL LOW (ref 13.0–17.0)
MCH: 31.8 pg (ref 26.0–34.0)
MCHC: 34.1 g/dL (ref 30.0–36.0)
MCV: 93.2 fL (ref 80.0–100.0)
Platelets: 209 10*3/uL (ref 150–400)
RBC: 3.65 MIL/uL — ABNORMAL LOW (ref 4.22–5.81)
RDW: 12.8 % (ref 11.5–15.5)
WBC: 5.8 10*3/uL (ref 4.0–10.5)
nRBC: 0 % (ref 0.0–0.2)

## 2019-06-05 LAB — BASIC METABOLIC PANEL
Anion gap: 9 (ref 5–15)
BUN: 23 mg/dL (ref 8–23)
CO2: 24 mmol/L (ref 22–32)
Calcium: 8.6 mg/dL — ABNORMAL LOW (ref 8.9–10.3)
Chloride: 103 mmol/L (ref 98–111)
Creatinine, Ser: 1.22 mg/dL (ref 0.61–1.24)
GFR calc Af Amer: >60 mL/min (ref >60)
GFR calc non Af Amer: 53 mL/min — ABNORMAL LOW (ref >60)
Glucose, Bld: 103 mg/dL — ABNORMAL HIGH (ref 70–99)
Potassium: 3.9 mmol/L (ref 3.5–5.1)
Sodium: 136 mmol/L (ref 135–145)

## 2019-06-05 NOTE — Progress Notes (Signed)
STROKE TEAM PROGRESS NOTE   INTERVAL HISTORY No new issues. Pt is sitting comfortably in bed. RUE weakness much improved now 3/5, but RLE still plegic with increased muscle tone. Lab and vital stable.   OBJECTIVE Vitals:   06/04/19 1628 06/04/19 1958 06/04/19 2353 06/05/19 0338  BP: (!) 146/63 (!) 138/55 (!) 141/62 137/66  Pulse: 69 78 75 73  Resp: 16 17 18 19   Temp: 99.2 F (37.3 C) 98.1 F (36.7 C) (!) 97.4 F (36.3 C) 98.5 F (36.9 C)  TempSrc: Oral Oral Oral Oral  SpO2: 96% 95% 95% 99%  Weight:       CBC:  Recent Labs  Lab 06/02/19 0417  WBC 6.0  HGB 11.4*  HCT 33.4*  MCV 91.8  PLT 215   Basic Metabolic Panel:  Recent Labs  Lab 05/31/19 0416 06/02/19 0417  NA 136 135  K 4.2 3.8  CL 101 101  CO2 25 23  GLUCOSE 99 97  BUN 21 22  CREATININE 1.28* 1.17  CALCIUM 8.7* 8.6*    IMAGING past 24h No results found.   PHYSICAL EXAM  General - Well nourished, well developed elderly caucasian male, in no apparent distress.    Cardiovascular - Regular rhythm and rate.  Neuro - awake alert, making eye contact, tracking bilaterally. Able to follow simple commands. Moderate dysarthria with paraphasic errors, but orientated to place, self, age. But not to time. Paucity of speech. Able to name 3/4, able to repeat simple sentence but moderate dysarthric voice. Some emotional swing with smiling and crying, not consistent with pseudobulbar but more depressed mood. Blinking to visual threat bilaterally. Right facial droop.  Tongue midline.  Left upper extremity 4/5, left lower extremity at least 3/5.  Right upper extremity 3/5 with mildly increased muscle tone, and right lower extremity plegic with significantly increased muscle tone. Right triple reflex positive.Global bradykinesia and rigidity. Sensation, coordination not cooperative and gait not tested.    ASSESSMENT/PLAN Scott Gilbert is a 86 y.o. male with history of hypertension and  mild dementia (medical care at  [facility]), presents to the emergency department with sudden onset of slurred speech, aphasia, left gaze preference with right-sided neglect, right hemiparesis, facial droop and weakness. He received IV t-PA Friday 04/16/19 at 2230.  Stroke: Left ACA territory infarct, likely due to large vessel disease although cardioembolic source not excluded  Consider 30 day cardiac event monitoring as outpt vs inpt loop placement on discharge   VTE prophylaxis - lovenox  No antithrombotic prior to admission, now on ASA 325 and plavix DAPT for 3 months and then ASA alone given significant intracranial stenosis  Palliative consult 04/23/19 - Full Code - plan OP referral to Hospice. Continue to follow pt as an IP, routine attempts to contact family.  Therapy recommendations: SNF   Disposition:  Pending - pt without insurance coverage - SW continues to be involved. The pts son has petitioned guardianship with the courts, and there is a court date scheduled for 06/09/19.   Medically ready for d/c once d/c plan determined.   Hypertension  Off lisinopril due to elevated Cre  On norvasc 5 -> 10 mg  On labetalol 100mg  bid . BP stable . BP goal normotensive  Hyperlipidemia  LDL 113, goal < 70  Current lipid lowering medication: on lipitor 40  Continue statin at discharge  Dysphagia . Secondary to stroke . D1 thin liquids->D2 thin liquids . Speech on board . Pt with good po intake eating 100% meals  and snacks . On IVF at 50/h  AKI on CKD 3a   Cre 1.17->1.22  Have repetitively stopped IVF after staff education to encourage PO, but Cre always goes up  Continue NS 50cc/hr   Other Stroke Risk Factors  Advanced age  Hx stroke/TIA - by imaging  Other Active Problems  Dementia - on galantamine and Risperdal   Hyponatremia - 136 - resolved  Dressing removed to L groin 3/18; still red, but no exudate.   Hospital day # 30  [name], MD PhD Stroke Neurology 06/05/2019 2:21 PM    To  contact Stroke Continuity provider, please refer to [email or web address] After hours, contact General Neurology

## 2019-06-06 NOTE — Progress Notes (Signed)
STROKE TEAM PROGRESS NOTE   INTERVAL HISTORY Patient daughter at bedside.  She stated that they are working on the court paperwork and try to get a patient insurance approval for SNF ASAP.  Pt is sitting comfortably in bed.  Still has right lower extremity spastic plegia.  Neuro stable.  OBJECTIVE Vitals:   06/06/19 0006 06/06/19 0352 06/06/19 0858 06/06/19 0911  BP: (!) 136/57 (!) 137/56  (!) 139/57  Pulse: 73 (!) 59  72  Resp: 16 18 18 18   Temp: 97.9 F (36.6 C) 97.7 F (36.5 C)  97.8 F (36.6 C)  TempSrc: Oral Oral  Oral  SpO2: 96%   98%  Weight:       CBC:  Recent Labs  Lab 06/02/19 0417 06/05/19 0515  WBC 6.0 5.8  HGB 11.4* 11.6*  HCT 33.4* 34.0*  MCV 91.8 93.2  PLT 215 209   Basic Metabolic Panel:  Recent Labs  Lab 06/02/19 0417 06/05/19 0515  NA 135 136  K 3.8 3.9  CL 101 103  CO2 23 24  GLUCOSE 97 103*  BUN 22 23  CREATININE 1.17 1.22  CALCIUM 8.6* 8.6*    IMAGING past 24h No results found.   PHYSICAL EXAM  General - Well nourished, well developed elderly caucasian male, in no apparent distress.    Cardiovascular - Regular rhythm and rate.  Neuro - awake alert, making eye contact, tracking bilaterally. Able to follow simple commands. Moderate dysarthria with paraphasic errors, but orientated to place, self, age. But not to time. Paucity of speech. Able to name 3/4, able to repeat simple sentence but moderate dysarthric voice. Some emotional swing with smiling and crying, not consistent with pseudobulbar but more depressed mood. Blinking to visual threat bilaterally. Right facial droop.  Tongue midline.  Left upper extremity 4/5, left lower extremity at least 3/5.  Right upper extremity 3/5 with mildly increased muscle tone, and right lower extremity plegic with significantly increased muscle tone. Right triple reflex positive. Global bradykinesia and rigidity. Sensation, coordination not cooperative and gait not tested.    ASSESSMENT/PLAN Mr. Keone Kamer is a 86 y.o. male with history of hypertension and  mild dementia (medical care at [facility]), presents to the emergency department with sudden onset of slurred speech, aphasia, left gaze preference with right-sided neglect, right hemiparesis, facial droop and weakness. He received IV t-PA Friday 04/16/19 at 2230.  Stroke: Left ACA territory infarct, likely due to large vessel disease although cardioembolic source not excluded  Consider inpt loop recorder placement on discharge   VTE prophylaxis - lovenox  No antithrombotic prior to admission, now on ASA 325 and plavix DAPT for 3 months and then ASA alone given significant intracranial stenosis  Palliative consult 04/23/19 - Full Code - plan OP referral to Hospice. Continue to follow pt as an IP, routine attempts to contact family.  Therapy recommendations: SNF   Disposition:  Pending - pt without insurance coverage - SW continues to be involved. The pts son has petitioned guardianship with the courts, and there is a court date scheduled for 06/09/19.   Medically ready for d/c once d/c plan determined.   Hypertension  Off lisinopril due to elevated Cre  On norvasc 5 -> 10 mg  On labetalol 100mg  bid . BP stable . BP goal normotensive  Hyperlipidemia  LDL 113, goal < 70  Current lipid lowering medication: on lipitor 40  Continue statin at discharge  Dysphagia . Secondary to stroke . D1 thin liquids->D2 thin  liquids . Speech on board . Pt with good po intake eating 100% meals and snacks . On IVF at 50/h  AKI on CKD 3a   Cre 1.17->1.22  Have repetitively stopped IVF after staff education to encourage PO, but Cre always goes up  Continue NS 50cc/hr   Other Stroke Risk Factors  Advanced age  Hx stroke/TIA - by imaging  Other Active Problems  Dementia - on galantamine and Risperdal   Hyponatremia - 136 - resolved  Dressing removed to L groin 3/18; still red, but no exudate.   Hospital day # 15  [name],  MD PhD Stroke Neurology 06/06/2019 4:16 PM   To contact Stroke Continuity provider, please refer to [email or web address] After hours, contact General Neurology

## 2019-06-06 NOTE — Plan of Care (Signed)
Plan of care reviewed with pt at bedside. VSS. Condom cath in place. Ns@50ml /hr infusing. Safety measures in place, call bell in reach. Pt stable at this time, will continue to monitor.  Problem: Education: Goal: Knowledge of disease or condition will improve Outcome: Progressing Goal: Knowledge of secondary prevention will improve Outcome: Progressing Goal: Knowledge of patient specific risk factors addressed and post discharge goals established will improve Outcome: Progressing Goal: Individualized Educational Video(s) Outcome: Progressing   Problem: Education: Goal: Knowledge of General Education information will improve Description: Including pain rating scale, medication(s)/side effects and non-pharmacologic comfort measures Outcome: Progressing   Problem: Health Behavior/Discharge Planning: Goal: Ability to manage health-related needs will improve Outcome: Progressing   Problem: Clinical Measurements: Goal: Ability to maintain clinical measurements within normal limits will improve Outcome: Progressing Goal: Will remain free from infection Outcome: Progressing Goal: Diagnostic test results will improve Outcome: Progressing Goal: Respiratory complications will improve Outcome: Progressing Goal: Cardiovascular complication will be avoided Outcome: Progressing   Problem: Activity: Goal: Risk for activity intolerance will decrease Outcome: Progressing   Problem: Nutrition: Goal: Adequate nutrition will be maintained Outcome: Progressing   Problem: Coping: Goal: Level of anxiety will decrease Outcome: Progressing   Problem: Elimination: Goal: Will not experience complications related to bowel motility Outcome: Progressing Goal: Will not experience complications related to urinary retention Outcome: Progressing   Problem: Pain Managment: Goal: General experience of comfort will improve Outcome: Progressing   Problem: Safety: Goal: Ability to remain free from  injury will improve Outcome: Progressing   Problem: Skin Integrity: Goal: Risk for impaired skin integrity will decrease Outcome: Progressing

## 2019-06-07 NOTE — Progress Notes (Signed)
Palliative Medicine RN Note: Discussed with Dr Julaine Fusi. Note that attending service continues to document that he is stable for discharge once a bed is found, and TOC team continues to work on disposition issues. At this time, there is not a role for continued PMT involvement.   If new, Palliative-specific needs arise, please re-consult our service.  Margret Chance Adrianna Dudas, RN, BSN, [facility] Palliative Medicine Team 06/07/2019 1:27 PM Office [phone]

## 2019-06-07 NOTE — Progress Notes (Signed)
Orthopedic Tech Progress Note Patient Details:  Scott Gilbert 1929-12-23 [id number] Therapy called requesting a new KNEE IMMOBILIZER for patient Ortho Devices Type of Ortho Device: Knee Immobilizer Ortho Device/Splint Location: RLE Ortho Device/Splint Interventions: Application, Ordered   Post Interventions Patient Tolerated: Well Instructions Provided: Care of device, Adjustment of device   [name] 06/07/2019, 10:51 AM

## 2019-06-07 NOTE — Progress Notes (Signed)
Physical Therapy Treatment Patient Details Name: Scott Gilbert MRN: [id number] DOB: May 08, 1929 Today's Date: 06/07/2019    History of Present Illness 86 y.o. male with past medical history significant for hypertension and mild dementia. He presented to the emergency department with sudden onset of slurred speech and aphasia, facial droop and weakness.   CT cerebral perfusion on 04/16/19 reported: "acute distal Left ACA territory ischemia and infarct."    PT Comments    Pt supine in bed on arrival.   Required assistance to clean peri area pre tx.  Knee immobilizer donned in supine.  Noted defect in side of brace and orthotech notified to bring a knee brace.  Pt continues to lack terminal knee extension and quad control on R side.  Pt continues to benefit from SNF placement as he continues to require external assistance to mobilize.    Follow Up Recommendations  Supervision/Assistance - 24 hour;SNF     Equipment Recommendations  Wheelchair (measurements PT)    Recommendations for Other Services Rehab consult     Precautions / Restrictions Precautions Precautions: Fall Precaution Comments: KI and ace wrap for gait training Required Braces or Orthoses: Knee Immobilizer - Right;Other Brace Knee Immobilizer - Right: On when out of bed or walking Other Brace: R UE resting hand splint in room for use at night Restrictions Weight Bearing Restrictions: No    Mobility  Bed Mobility Overal bed mobility: Needs Assistance Bed Mobility: Supine to Sit     Supine to sit: Mod assist     General bed mobility comments: Pt able to initiate moving hips and LEs to edge of bed,  required moderate assistance to elevate trunk into a seated position. Knee immobilizer applied in supine  Transfers Overall transfer level: Needs assistance Equipment used: Rolling walker (2 wheeled) Transfers: Sit to/from Stand Sit to Stand: Mod assist         General transfer comment: Pt required assistance to  rise into standing, presents with R LE scissoring over L.  Moderate assistance to weight shift R to correct baance.  Ambulation/Gait Ambulation/Gait assistance: Mod assist;+2 safety/equipment Gait Distance (Feet): 20 Feet(+ 25 ft) Assistive device: Rolling walker (2 wheeled) Gait Pattern/deviations: Step-to pattern;Shuffle;Ataxic;Trunk flexed;Decreased dorsiflexion - right Gait velocity: decreased   General Gait Details: Pt required close chair follow , assistance to maintain path of RW and advance RLE forward.  Pt noted with more neutral alignment of RLE this session.  Poor gt symmtery noted.  Continues to buckle within stance phase in brace.  Turned R sock inside out to assist with advancement of R foot due to decreased dorsiflexion.   Stairs             Wheelchair Mobility    Modified Rankin (Stroke Patients Only) Modified Rankin (Stroke Patients Only) Pre-Morbid Rankin Score: No symptoms Modified Rankin: Moderately severe disability     Balance Overall balance assessment: Needs assistance Sitting-balance support: Single extremity supported;Feet supported Sitting balance-Leahy Scale: Fair Sitting balance - Comments: RUE on rail, LUE on bed. Supervision for safety Postural control: Right lateral lean;Posterior lean   Standing balance-Leahy Scale: Poor                              Cognition Arousal/Alertness: Awake/alert Behavior During Therapy: Restless(B hand mitts in place.) Overall Cognitive Status: History of cognitive impairments - at baseline  General Comments: Following 1 step commands inconsistently, laughing occasionally      Exercises      General Comments        Pertinent Vitals/Pain Pain Assessment: Faces Faces Pain Scale: No hurt Pain Location: with RUE ROm and R HS with stretching Pain Descriptors / Indicators: Tightness Pain Intervention(s): Monitored during session;Repositioned     Home Living                      Prior Function            PT Goals (current goals can now be found in the care plan section) Acute Rehab PT Goals Patient Stated Goal: none stated Potential to Achieve Goals: Fair Progress towards PT goals: Progressing toward goals    Frequency    Min 3X/week      PT Plan Current plan remains appropriate    Co-evaluation              AM-PAC PT "6 Clicks" Mobility   Outcome Measure  Help needed turning from your back to your side while in a flat bed without using bedrails?: A Lot Help needed moving from lying on your back to sitting on the side of a flat bed without using bedrails?: A Lot Help needed moving to and from a bed to a chair (including a wheelchair)?: A Lot Help needed standing up from a chair using your arms (e.g., wheelchair or bedside chair)?: A Lot Help needed to walk in hospital room?: A Lot Help needed climbing 3-5 steps with a railing? : Total 6 Click Score: 11    End of Session Equipment Utilized During Treatment: Gait belt Activity Tolerance: Patient tolerated treatment well Patient left: in chair;with call bell/phone within reach;with chair alarm set Nurse Communication: Mobility status PT Visit Diagnosis: Other abnormalities of gait and mobility (R26.89);Hemiplegia and hemiparesis;Muscle weakness (generalized) (M62.81) Hemiplegia - Right/Left: Right Hemiplegia - dominant/non-dominant: Dominant Hemiplegia - caused by: Cerebral infarction     Time: 1001-1026 PT Time Calculation (min) (ACUTE ONLY): 25 min  Charges:  $Gait Training: 8-22 mins $Therapeutic Activity: 8-22 mins                     Erasmo Leventhal , PTA Acute Rehabilitation Services Pager [phone] Office [phone]     Aireal Slater [name] 06/07/2019, 12:03 PM

## 2019-06-07 NOTE — Progress Notes (Signed)
STROKE TEAM PROGRESS NOTE   INTERVAL HISTORY Patient up in chair. Readily awakens to voice, immediately verbally interactive. No complaints. Ready for lunch.   OBJECTIVE Vitals:   06/06/19 2348 06/07/19 0327 06/07/19 0828 06/07/19 1231  BP: (!) 139/59 135/63 (!) 156/64 (!) 144/113  Pulse: 80 85 80 73  Resp: 18 18 18 18   Temp: 99.1 F (37.3 C) 98.4 F (36.9 C) 99.1 F (37.3 C) 98.8 F (37.1 C)  TempSrc: Oral Oral Oral Oral  SpO2: 92% 93% 95% 97%  Weight:       CBC:  Recent Labs  Lab 06/02/19 0417 06/05/19 0515  WBC 6.0 5.8  HGB 11.4* 11.6*  HCT 33.4* 34.0*  MCV 91.8 93.2  PLT 215 962   Basic Metabolic Panel:  Recent Labs  Lab 06/02/19 0417 06/05/19 0515  NA 135 136  K 3.8 3.9  CL 101 103  CO2 23 24  GLUCOSE 97 103*  BUN 22 23  CREATININE 1.17 1.22  CALCIUM 8.6* 8.6*    IMAGING past 24h No results found.   PHYSICAL EXAM  General - Well nourished, well developed elderly caucasian male, in no apparent distress.    Cardiovascular - Regular rhythm and rate.  Neuro - awake alert, making eye contact, tracking bilaterally. Able to follow simple commands. Moderate dysarthria with paraphasic errors, but orientated to place, self, age. But not to time. Paucity of speech. Able to name 3/4, able to repeat simple sentence but moderate dysarthric voice.  Blinking to visual threat bilaterally. Right facial droop.  Tongue midline.  Left upper extremity 4/5, left lower extremity at least 3/5.  Right upper extremity 3/5 with mildly increased muscle tone, and right lower extremity plegic with significantly increased muscle tone. Right triple reflex positive. Global bradykinesia and rigidity. Sensation, coordination not cooperative and gait not tested.    ASSESSMENT/PLAN [name] is a 86 y.o. male with history of hypertension and  mild dementia (medical care at [facility]), presents to the emergency department with sudden onset of slurred speech, aphasia, left gaze  preference with right-sided neglect, right hemiparesis, facial droop and weakness. He received IV t-PA Friday 04/16/19 at 2230.  Stroke: Left ACA territory infarct, likely due to large vessel disease although cardioembolic source not excluded  Consider inpt loop recorder placement on discharge   VTE prophylaxis - lovenox  No antithrombotic prior to admission, now on ASA 325 and plavix DAPT for 3 months and then ASA alone given significant intracranial stenosis  Palliative consult 04/23/19 - Full Code - plan OP referral to Hospice. Continue to follow pt as an IP, routine attempts to contact family.  Therapy recommendations: SNF   Disposition:  Pending - pt without insurance coverage - SW continues to be involved. The pts son has petitioned guardianship with the courts, and there is a court date scheduled for 06/09/19.   Medically ready for d/c once d/c plan determined.   Hypertension  Off lisinopril due to elevated Cre  On norvasc 5 -> 10 mg  On labetalol 100mg  bid . BP stable . BP goal normotensive  Hyperlipidemia  LDL 113, goal < 70  Current lipid lowering medication: on lipitor 40  Continue statin at discharge  Dysphagia . Secondary to stroke . D1 thin liquids->D2 thin liquids . Speech on board . Pt with good po intake eating 100% meals and snacks . On IVF at 50/h  AKI on CKD 3a   Cre 1.17->1.22  Have repetitively stopped IVF after staff education to  encourage PO, but Cre always goes up  Continue NS 50cc/hr   Other Stroke Risk Factors  Advanced age  Hx stroke/TIA - by imaging  Other Active Problems  Dementia - on galantamine and Risperdal   Hyponatremia - 136 - resolved  Dressing removed to L groin 3/18; still red, but no exudate.   No change in condition. Awaiting court date.  Hospital day # 46  Marvel Plan, MD PhD Stroke Neurology 06/07/2019 1:49 PM   To contact Stroke Continuity provider, please refer to [email or web address]. After hours, contact  General Neurology

## 2019-06-08 NOTE — Progress Notes (Signed)
STROKE TEAM PROGRESS NOTE   INTERVAL HISTORY Patient up in chair. "I just ate all my lunch". No new complaints. Says he likes to stay busy.    OBJECTIVE Vitals:   06/07/19 2313 06/08/19 0323 06/08/19 0811 06/08/19 1146  BP: 140/64 (!) 130/57 140/60 (!) 142/63  Pulse: 92 79 75 71  Resp: 17 18 20 20   Temp: 99.1 F (37.3 C) 98.8 F (37.1 C) 98.4 F (36.9 C) 97.9 F (36.6 C)  TempSrc: Oral Oral Oral Oral  SpO2: 100% 100% 93% 96%  Weight:       CBC:  Recent Labs  Lab 06/02/19 0417 06/05/19 0515  WBC 6.0 5.8  HGB 11.4* 11.6*  HCT 33.4* 34.0*  MCV 91.8 93.2  PLT 215 209   Basic Metabolic Panel:  Recent Labs  Lab 06/02/19 0417 06/05/19 0515  NA 135 136  K 3.8 3.9  CL 101 103  CO2 23 24  GLUCOSE 97 103*  BUN 22 23  CREATININE 1.17 1.22  CALCIUM 8.6* 8.6*    IMAGING past 24h No results found.   PHYSICAL EXAM    General - Well nourished, well developed elderly caucasian male, in no apparent distress.    Cardiovascular - Regular rhythm and rate.  Neuro - awake alert, making eye contact, tracking bilaterally. Able to follow simple commands. Cannot show 2 fingers. Moderate dysarthria with paraphasic errors, but orientated to place, self, age. But not to time. Can state his whole name. Paucity of speech. Able to name objects, repeat simple sentence but moderate dysarthric voice.  Blinking to visual threat bilaterally. Right facial droop.  Tongue midline.  Left upper extremity 4/5, left lower extremity at least 3/5.  Right upper extremity 3/5 with mildly increased muscle tone, and right lower extremity plegic with significantly increased muscle tone. Right triple reflex positive. Global bradykinesia and rigidity. Sensation, coordination not cooperative and gait not tested.   ASSESSMENT/PLAN [name] is a 86 y.o. male with history of hypertension and  mild dementia (medical care at [facility]), presents to the emergency department with sudden onset of slurred speech,  aphasia, left gaze preference with right-sided neglect, right hemiparesis, facial droop and weakness. He received IV t-PA Friday 04/16/19 at 2230.  Stroke: Left ACA territory infarct, likely due to large vessel disease although cardioembolic source not excluded  Consider inpt loop recorder placement on discharge   VTE prophylaxis - lovenox  No antithrombotic prior to admission, now on ASA 325 and plavix DAPT for 3 months and then ASA alone given significant intracranial stenosis  Palliative consult 04/23/19 - Full Code - plan OP referral to Hospice. Continue to follow pt as an IP, routine attempts to contact family.  Therapy recommendations: SNF   Disposition:  Pending - pt without insurance coverage - SW continues to be involved. The pts son has petitioned guardianship with the courts, and there is a court date scheduled for 06/09/19.   Medically ready for d/c once d/c plan determined.   Hypertension  Off lisinopril due to elevated Cre  On norvasc 5 -> 10 mg  On labetalol 100mg  bid . BP stable . BP goal normotensive  Hyperlipidemia  LDL 113, goal < 70  Current lipid lowering medication: on lipitor 40  Continue statin at discharge  Dysphagia . Secondary to stroke . D1 thin liquids->D2 thin liquids . Speech on board . Pt with good po intake eating 100% meals and snacks . On IVF at 50/h  AKI on CKD 3a  Cre 1.17->1.22  Have repetitively stopped IVF after staff education to encourage PO, but Cre always goes up  Continue NS 50cc/hr   Other Stroke Risk Factors  Advanced age  Hx stroke/TIA - by imaging  Other Active Problems  Dementia - on galantamine and Risperdal   Hyponatremia - 136 - resolved  Dressing removed to L groin 3/18; still red, but no exudate.   No change. Awaiting court date tomorrow. Lawyer to notify SW of outcome.  Hospital day # [place], MSN, APRN, ANVP-BC, AGPCNP-BC Advanced Practice Stroke Nurse [place] for Schedule & Pager information 06/08/2019 12:50 PM   To contact Stroke Continuity provider, please refer to [email or web address] After hours, contact General Neurology

## 2019-06-08 NOTE — Progress Notes (Signed)
Occupational Therapy Treatment Patient Details Name: Scott Gilbert MRN: [id number] DOB: 1929-12-28 Today's Date: 06/08/2019    History of present illness 86 y.o. male with past medical history significant for hypertension and mild dementia. He presented to the emergency department with sudden onset of slurred speech and aphasia, facial droop and weakness.   CT cerebral perfusion on 04/16/19 reported: "acute distal Left ACA territory ischemia and infarct."   OT comments  Patient continues to make steady progress towards goals in skilled OT session. Patient's session encompassed bed mobility, functional transfers, and increased focus on [facility] and overall mobility for RUE. Pt completed towel slides on tray in unsupported sitting, requiring mod A at elbow in order to extend and retract (focus on shoulder and elbow flexion and extension). Pt continues to require tactile cues for LUE in order to prevent from assisting and facilitating work on RUE. After transferring to the chair, pt then worked on grasp and release with RUE with medicine cups. Pt requires max verbal cues and min tactile cues to attempt to release medicine cups in therapist's hand, however there is notable improvement in the extension of the thumb and index. Will continue to benefit from continued services; will follow acutely.    Follow Up Recommendations  Supervision/Assistance - 24 hour;SNF    Equipment Recommendations  Wheelchair (measurements OT);Wheelchair cushion (measurements OT);3 in 1 bedside commode    Recommendations for Other Services      Precautions / Restrictions Precautions Precautions: Fall Precaution Comments: KI and ace wrap for gait training Required Braces or Orthoses: Knee Immobilizer - Right;Other Brace Knee Immobilizer - Right: On when out of bed or walking Other Brace: R UE resting hand splint in room for use at night Restrictions Weight Bearing Restrictions: No       Mobility Bed Mobility Overal  bed mobility: Needs Assistance Bed Mobility: Supine to Sit   Sidelying to sit: Mod assist       General bed mobility comments: Pt reliant on cues to initiate LE movement to EOB, however required increased assistance to bring off of bed to date and required mod A (hand rails and therapist) to elevate trunk to sitting  Transfers Overall transfer level: Needs assistance   Transfers: Stand Pivot Transfers   Stand pivot transfers: Mod assist       General transfer comment: Mod A to complete stand pivot transfer on L with max verbal and tactile cues to complete, minimal extra time required to complete    Balance Overall balance assessment: Needs assistance Sitting-balance support: Single extremity supported;Feet supported Sitting balance-Leahy Scale: Fair                                     ADL either performed or assessed with clinical judgement   ADL Overall ADL's : Needs assistance/impaired     Grooming: Wash/dry hands;Wash/dry face;Bed level;Sitting;Minimal assistance Grooming Details (indicate cue type and reason): pt required min cues for thorughness and max verbal cues to incorporate RUE into tasks                 Toilet Transfer: Moderate assistance;Cueing for safety;Cueing for sequencing;Stand-pivot;Maximal assistance Toilet Transfer Details (indicate cue type and reason): Mod A x1 (simualted with positioning armchair to strong side and transferring to recliner)         Functional mobility during ADLs: Maximal assistance;Cueing for sequencing;Moderate assistance;Cueing for safety General ADL Comments: Pt sat EOB to  complete various FMC exercises after grooming tasks to incorportate RUE into tasks. No LOB noted in session, however pt is dependent on verbal and tactice cues to prevent LUE from assisting and doing all the work for the [employer]      Cognition Arousal/Alertness: Awake/alert Behavior During  Therapy: WFL for tasks assessed/performed Overall Cognitive Status: History of cognitive impairments - at baseline                                 General Comments: Following 1 step commands inconsistently, laughing occasionally, was able to verbalize        Exercises Hand Activities Stack Objects: Right;10 reps;Left(Stacking medicine cups from left to right, focusing on handing to therapist on R side)   Shoulder Instructions       General Comments      Pertinent Vitals/ Pain       Pain Assessment: Faces Faces Pain Scale: No hurt  Home Living                                          Prior Functioning/Environment              Frequency  Min 2X/week        Progress Toward Goals  OT Goals(current goals can now be found in the care plan section)  Progress towards OT goals: Progressing toward goals  Acute Rehab OT Goals Patient Stated Goal: none stated OT Goal Formulation: Patient unable to participate in goal setting Time For Goal Achievement: 06/08/19 Potential to Achieve Goals: Good  Plan Discharge plan remains appropriate    Co-evaluation                 AM-PAC OT "6 Clicks" Daily Activity     Outcome Measure   Help from another person eating meals?: A Lot Help from another person taking care of personal grooming?: A Little Help from another person toileting, which includes using toliet, bedpan, or urinal?: A Lot Help from another person bathing (including washing, rinsing, drying)?: A Lot Help from another person to put on and taking off regular upper body clothing?: A Lot Help from another person to put on and taking off regular lower body clothing?: A Lot 6 Click Score: 13    End of Session Equipment Utilized During Treatment: Gait belt  OT Visit Diagnosis: Unsteadiness on feet (R26.81);Feeding difficulties (R63.3)   Activity Tolerance Patient tolerated treatment well   Patient Left in chair;with call  bell/phone within reach;with chair alarm set([place]y Belt)   Nurse Communication Mobility status        Time: [phone] OT Time Calculation (min): 35 min  Charges: OT General Charges $OT Visit: 1 Visit OT Treatments $Self Care/Home Management : 8-22 mins $Therapeutic Activity: 8-22 mins   Corinne Ports E. Loeta Herst, COTA/L Acute Rehabilitation Services [phone] [place] 06/08/2019, 11:25 AM

## 2019-06-09 LAB — CBC
HCT: 32.3 % — ABNORMAL LOW (ref 39.0–52.0)
Hemoglobin: 10.9 g/dL — ABNORMAL LOW (ref 13.0–17.0)
MCH: 31.6 pg (ref 26.0–34.0)
MCHC: 33.7 g/dL (ref 30.0–36.0)
MCV: 93.6 fL (ref 80.0–100.0)
Platelets: 193 10*3/uL (ref 150–400)
RBC: 3.45 MIL/uL — ABNORMAL LOW (ref 4.22–5.81)
RDW: 13 % (ref 11.5–15.5)
WBC: 8.5 10*3/uL (ref 4.0–10.5)
nRBC: 0 % (ref 0.0–0.2)

## 2019-06-09 LAB — BASIC METABOLIC PANEL
Anion gap: 8 (ref 5–15)
BUN: 20 mg/dL (ref 8–23)
CO2: 24 mmol/L (ref 22–32)
Calcium: 8.4 mg/dL — ABNORMAL LOW (ref 8.9–10.3)
Chloride: 102 mmol/L (ref 98–111)
Creatinine, Ser: 1.2 mg/dL (ref 0.61–1.24)
GFR calc Af Amer: >60 mL/min (ref >60)
GFR calc non Af Amer: 54 mL/min — ABNORMAL LOW (ref >60)
Glucose, Bld: 108 mg/dL — ABNORMAL HIGH (ref 70–99)
Potassium: 3.8 mmol/L (ref 3.5–5.1)
Sodium: 134 mmol/L — ABNORMAL LOW (ref 135–145)

## 2019-06-09 NOTE — Plan of Care (Signed)
  Problem: Education: Goal: Knowledge of disease or condition will improve Outcome: Progressing Goal: Knowledge of secondary prevention will improve Outcome: Progressing Goal: Knowledge of patient specific risk factors addressed and post discharge goals established will improve Outcome: Progressing Goal: Individualized Educational Video(s) Outcome: Progressing   Problem: Education: Goal: Knowledge of General Education information will improve Description: Including pain rating scale, medication(s)/side effects and non-pharmacologic comfort measures Outcome: Progressing   Problem: Health Behavior/Discharge Planning: Goal: Ability to manage health-related needs will improve Outcome: Progressing   Problem: Clinical Measurements: Goal: Ability to maintain clinical measurements within normal limits will improve Outcome: Progressing Goal: Will remain free from infection Outcome: Progressing Goal: Diagnostic test results will improve Outcome: Progressing Goal: Respiratory complications will improve Outcome: Progressing Goal: Cardiovascular complication will be avoided Outcome: Progressing   Problem: Activity: Goal: Risk for activity intolerance will decrease Outcome: Progressing   Problem: Nutrition: Goal: Adequate nutrition will be maintained Outcome: Progressing   Problem: Coping: Goal: Level of anxiety will decrease Outcome: Progressing   Problem: Elimination: Goal: Will not experience complications related to bowel motility Outcome: Progressing Goal: Will not experience complications related to urinary retention Outcome: Progressing   Problem: Pain Managment: Goal: General experience of comfort will improve Outcome: Progressing   Problem: Safety: Goal: Ability to remain free from injury will improve Outcome: Progressing   Problem: Skin Integrity: Goal: Risk for impaired skin integrity will decrease Outcome: Progressing   

## 2019-06-09 NOTE — Progress Notes (Addendum)
STROKE TEAM PROGRESS NOTE   INTERVAL HISTORY Patient currently eating lunch. No change in condition.    OBJECTIVE Vitals:   06/08/19 2327 06/09/19 0404 06/09/19 0753 06/09/19 0851  BP: 124/73 (!) 147/64 (!) 219/199 (!) 180/62  Pulse: 84 76 83   Resp: 16 17 20    Temp: 99.1 F (37.3 C) 98.7 F (37.1 C) 98.6 F (37 C)   TempSrc: Oral Oral Oral   SpO2:   93%   Weight:       CBC:  Recent Labs  Lab 06/05/19 0515 06/09/19 0454  WBC 5.8 8.5  HGB 11.6* 10.9*  HCT 34.0* 32.3*  MCV 93.2 93.6  PLT 209 193   Basic Metabolic Panel:  Recent Labs  Lab 06/05/19 0515 06/09/19 0454  NA 136 134*  K 3.9 3.8  CL 103 102  CO2 24 24  GLUCOSE 103* 108*  BUN 23 20  CREATININE 1.22 1.20  CALCIUM 8.6* 8.4*    IMAGING past 24h No results found.   PHYSICAL EXAM      General - Well nourished, well developed elderly caucasian male, in no apparent distress.    Cardiovascular - Regular rhythm and rate.  Neuro - awake alert, making eye contact, tracking bilaterally. Able to follow simple commands. Cannot show 2 fingers. Moderate dysarthria with paraphasic errors, but orientated to place, self, age. But not to time. Can state his whole name. Able to name objects, repeat simple sentence but moderate dysarthric voice. Paucity of speech. Blinking to visual threat bilaterally. Right facial droop.  Tongue midline.  Left upper extremity 4/5, left lower extremity at least 3/5.  Right upper extremity 3/5 with mildly increased muscle tone, and right lower extremity plegic with significantly increased muscle tone. Right triple reflex positive. Global bradykinesia and rigidity. Sensation, coordination not cooperative and gait not tested.   ASSESSMENT/PLAN Scott Gilbert is a 86 y.o. male with history of hypertension and  mild dementia (medical care at [facility]), presents to the emergency department with sudden onset of slurred speech, aphasia, left gaze preference with right-sided neglect, right  hemiparesis, facial droop and weakness. He received IV t-PA Friday 04/16/19 at 2230.  Stroke: Left ACA territory infarct, likely due to large vessel disease although cardioembolic source not excluded  Consider inpt loop recorder placement on discharge   VTE prophylaxis - lovenox  No antithrombotic prior to admission, now on ASA 325 and plavix DAPT for 3 months and then ASA alone given significant intracranial stenosis  Palliative consult 04/23/19 - Full Code - plan OP referral to Hospice. Continue to follow pt as an IP, routine attempts to contact family.  Therapy recommendations: SNF   Disposition:  Pending - pt without insurance coverage - SW continues to be involved. The pts son has petitioned guardianship with the courts, and there is a court date scheduled for 06/09/19.   Medically ready for d/c once d/c plan determined.   Hypertension  Off lisinopril due to elevated Cre  On norvasc 5 -> 10 mg  On labetalol 100mg  bid . 1 elevated BP 219/199 recheck 180/62 . Monitor  . BP goal normotensive  Hyperlipidemia  LDL 113, goal < 70  Current lipid lowering medication: on lipitor 40  Continue statin at discharge  Dysphagia . Secondary to stroke . D1 thin liquids->D2 thin liquids . Speech on board . Pt with good po intake eating 100% meals and snacks . On IVF at 50/h  AKI on CKD 3a   Cre 1.2  Have repetitively  stopped IVF after staff education to encourage PO, but Cre always goes up  Continue NS 50cc/hr   Other Stroke Risk Factors  Advanced age  Hx stroke/TIA - by imaging  Other Active Problems  Dementia - on galantamine and Risperdal   Hyponatremia - 134   Dressing removed to L groin 3/18; still red, but no exudate.   Court date today for guardianship.  Lawyer to notify SW of outcome.  Hospital day # [place], MSN, APRN, ANVP-BC, AGPCNP-BC Advanced Practice Stroke Nurse [place] for Schedule & Pager  information 06/09/2019 11:35 AM   ATTENDING NOTE: I reviewed above note and agree with the assessment and plan. Pt was seen and examined.   Pt neuro stable. No acute event overnight. Vital stable, although BP on the high end. Lab stable. Cre.1.20, will decrease IVF to 25cc in preparation of SNF transition. Hb stable. Na 134. SW still working on placement.   Scott Hawking, MD PhD Stroke Neurology 06/09/2019 6:30 PM     To contact Stroke Continuity provider, please refer to [email or web address] After hours, contact General Neurology

## 2019-06-09 NOTE — Progress Notes (Signed)
Physical Therapy Treatment Patient Details Name: Scott Gilbert MRN: [id number] DOB: [birth year] Today's Date: 06/09/2019    History of Present Illness 86 y.o. male with past medical history significant for hypertension and mild dementia. He presented to the emergency department with sudden onset of slurred speech and aphasia, facial droop and weakness.   CT cerebral perfusion on 04/16/19 reported: "acute distal Left ACA territory ischemia and infarct."    PT Comments    Pt supine in bed on arrival this session.  Pt required assistance to move edge of bed with moderate assistance.  Once in sitting required moderate assistance to stand and correct balance.  Pt with new shorter KI Which cause more instability in his R knee.  Pt continues to benefit from skilled rehab in a post acute setting.    Follow Up Recommendations  Supervision/Assistance - 24 hour;SNF     Equipment Recommendations  Wheelchair (measurements PT)    Recommendations for Other Services       Precautions / Restrictions Precautions Precautions: Fall Precaution Comments: short length KI- but patient buckled in the shorter brace. Required Braces or Orthoses: Knee Immobilizer - Right;Other Brace Knee Immobilizer - Right: On when out of bed or walking Other Brace: R UE resting hand splint in room for use at night Restrictions Weight Bearing Restrictions: No    Mobility  Bed Mobility Overal bed mobility: Needs Assistance Bed Mobility: Supine to Sit;Sit to Supine Rolling: Mod assist Sidelying to sit: Mod assist       General bed mobility comments: Pt reliant on cues to initiate LE movement to EOB, however required increased assistance to bring off of bed to date and required mod A (hand rails and therapist) to elevate trunk to sitting  Transfers Overall transfer level: Needs assistance Equipment used: Rolling walker (2 wheeled) Transfers: Sit to/from Stand Sit to Stand: Mod assist         General transfer  comment: Mod A to complete stand pivot transfer on L with max verbal and tactile cues to complete, minimal extra time required to complete  Ambulation/Gait Ambulation/Gait assistance: Max assist;+2 safety/equipment Gait Distance (Feet): 10 Feet Assistive device: Rolling walker (2 wheeled) Gait Pattern/deviations: Step-to pattern;Shuffle;Ataxic;Trunk flexed;Decreased dorsiflexion - right Gait velocity: decreased   General Gait Details: Pt required close chair follow , assistance to maintain path of RW and advance RLE forward.  Pt noted with more neutral alignment of RLE this session.  Poor gt symmtery noted.  Continues to buckle within stance phase in brace.   Stairs             Wheelchair Mobility    Modified Rankin (Stroke Patients Only) Modified Rankin (Stroke Patients Only) Pre-Morbid Rankin Score: No symptoms Modified Rankin: Moderately severe disability     Balance Overall balance assessment: Needs assistance Sitting-balance support: Single extremity supported;Feet supported Sitting balance-Leahy Scale: Fair Sitting balance - Comments: RUE on rail, LUE on bed. Supervision for safety     Standing balance-Leahy Scale: Poor Standing balance comment: UE support and assist for balance                            Cognition Arousal/Alertness: Awake/alert Behavior During Therapy: WFL for tasks assessed/performed Overall Cognitive Status: History of cognitive impairments - at baseline                                 General  Comments: Following 1 step commands inconsistently, laughing occasionally, was able to verbalize      Exercises      General Comments        Pertinent Vitals/Pain Pain Assessment: Faces Faces Pain Scale: No hurt Pain Location: with RUE ROm and R HS with stretching Pain Descriptors / Indicators: Tightness Pain Intervention(s): Monitored during session;Repositioned    Home Living                      Prior  Function            PT Goals (current goals can now be found in the care plan section) Acute Rehab PT Goals Potential to Achieve Goals: Fair Progress towards PT goals: Progressing toward goals    Frequency    Min 3X/week      PT Plan Current plan remains appropriate    Co-evaluation              AM-PAC PT "6 Clicks" Mobility   Outcome Measure  Help needed turning from your back to your side while in a flat bed without using bedrails?: A Lot Help needed moving from lying on your back to sitting on the side of a flat bed without using bedrails?: A Lot Help needed moving to and from a bed to a chair (including a wheelchair)?: A Lot Help needed standing up from a chair using your arms (e.g., wheelchair or bedside chair)?: A Lot Help needed to walk in hospital room?: A Lot Help needed climbing 3-5 steps with a railing? : Total 6 Click Score: 11    End of Session Equipment Utilized During Treatment: Gait belt Activity Tolerance: Patient tolerated treatment well Patient left: in chair;with call bell/phone within reach;with chair alarm set Nurse Communication: Mobility status PT Visit Diagnosis: Other abnormalities of gait and mobility (R26.89);Hemiplegia and hemiparesis;Muscle weakness (generalized) (M62.81) Hemiplegia - Right/Left: Right Hemiplegia - dominant/non-dominant: Dominant Hemiplegia - caused by: Cerebral infarction     Time: [id number] PT Time Calculation (min) (ACUTE ONLY): 21 min  Charges:  $Gait Training: 8-22 mins                     Erasmo Leventhal , PTA Acute Rehabilitation Services Pager [phone] Office [phone]     Letishia Elliott [name] 06/09/2019, 5:27 PM

## 2019-06-10 NOTE — Plan of Care (Signed)
  Problem: Clinical Measurements: Goal: Will remain free from infection Outcome: Progressing Goal: Diagnostic test results will improve Outcome: Progressing Goal: Respiratory complications will improve Outcome: Progressing Goal: Cardiovascular complication will be avoided Outcome: Progressing   Problem: Nutrition: Goal: Adequate nutrition will be maintained Outcome: Progressing   Problem: Coping: Goal: Level of anxiety will decrease Outcome: Progressing   Problem: Elimination: Goal: Will not experience complications related to bowel motility Outcome: Progressing Note: Multiple softer BMs today Goal: Will not experience complications related to urinary retention Outcome: Progressing Note: Good UOP through condom cath

## 2019-06-10 NOTE — Progress Notes (Signed)
STROKE TEAM PROGRESS NOTE   INTERVAL HISTORY Lying in bed. Readily responds "I am fine" to greeting. No change overnight.   OBJECTIVE Vitals:   06/10/19 0337 06/10/19 0408 06/10/19 0824 06/10/19 1131  BP: 137/62  (!) 148/66 (!) 156/72  Pulse: 78  77 84  Resp: 17  18 16   Temp: 98.9 F (37.2 C)  98.5 F (36.9 C) 98.9 F (37.2 C)  TempSrc: Oral  Oral Oral  SpO2: 93%  90% 95%  Weight:  71.1 kg     CBC:  Recent Labs  Lab 06/05/19 0515 06/09/19 0454  WBC 5.8 8.5  HGB 11.6* 10.9*  HCT 34.0* 32.3*  MCV 93.2 93.6  PLT 209 193   Basic Metabolic Panel:  Recent Labs  Lab 06/05/19 0515 06/09/19 0454  NA 136 134*  K 3.9 3.8  CL 103 102  CO2 24 24  GLUCOSE 103* 108*  BUN 23 20  CREATININE 1.22 1.20  CALCIUM 8.6* 8.4*    IMAGING past 24h No results found.   PHYSICAL EXAM    - unchanged  General - Well nourished, well developed elderly caucasian male, in no apparent distress.    Cardiovascular - Regular rhythm and rate.  Neuro - awake alert, making eye contact, tracking bilaterally. Able to follow simple commands. Cannot show 2 fingers. Moderate dysarthria with paraphasic errors, but orientated to place, self, age. But not to time. Can state his whole name. Able to name objects, repeat simple sentence but moderate dysarthric voice. Paucity of speech. Blinking to visual threat bilaterally. Right facial droop.  Tongue midline.  Left upper extremity 4/5, left lower extremity at least 3/5.  Right upper extremity 3/5 with mildly increased muscle tone, and right lower extremity plegic with significantly increased muscle tone. Right triple reflex positive. Global bradykinesia and rigidity. Sensation, coordination not cooperative and gait not tested.   ASSESSMENT/PLAN [name] is a 86 y.o. male with history of hypertension and  mild dementia (medical care at [facility]), presents to the emergency department with sudden onset of slurred speech, aphasia, left gaze preference with  right-sided neglect, right hemiparesis, facial droop and weakness. He received IV t-PA Friday 04/16/19 at 2230.  Stroke: Left ACA territory infarct, likely due to large vessel disease although cardioembolic source not excluded  Consider inpt loop recorder placement on discharge   VTE prophylaxis - lovenox  No antithrombotic prior to admission, now on ASA 325 and plavix DAPT for 3 months and then ASA alone given significant intracranial stenosis  Palliative consult 04/23/19 - Full Code - plan OP referral to Hospice. Continue to follow pt as an IP, routine attempts to contact family.  Therapy recommendations: SNF   Disposition:  Pending - pt without insurance coverage - SW continues to be involved. Son's petition for guardianship case was 06/09/2019.   Medically ready for d/c once d/c plan determined.   Hypertension  Off lisinopril due to elevated Cre  On norvasc 5 -> 10 mg  On labetalol 100mg  bid . BP stable 130-150s. Elevated BP x 1 yesterday was d/t malpositioned BP cuff which resolved when adjusted.  . BP goal normotensive  Hyperlipidemia  LDL 113, goal < 70  Current lipid lowering medication: on lipitor 40  Continue statin at discharge  Dysphagia . Secondary to stroke . D1 thin liquids->D2 thin liquids . Speech on board . Pt with good po intake eating 100% meals and snacks . On IVF at 50/h->25  AKI on CKD 3a   Cre 1.2  Have repetitively stopped IVF after staff education to encourage PO, but Cre always goes up  Continue NS 50cc/hr   Other Stroke Risk Factors  Advanced age  Hx stroke/TIA - by imaging  Other Active Problems  Dementia - on galantamine and Risperdal   Hyponatremia - 134   Dressing removed to L groin 3/18; still red, but no exudate.   Hospital day # [place], MSN, APRN, ANVP-BC, AGPCNP-BC Advanced Practice Stroke Nurse [facility] for Schedule & Pager information 06/10/2019 3:05 PM       To contact  Stroke Continuity provider, please refer to [email or web address] After hours, contact General Neurology

## 2019-06-10 NOTE — Plan of Care (Signed)
  Problem: Education: Goal: Knowledge of disease or condition will improve Outcome: Progressing Goal: Knowledge of secondary prevention will improve Outcome: Progressing Goal: Knowledge of patient specific risk factors addressed and post discharge goals established will improve Outcome: Progressing Goal: Individualized Educational Video(s) Outcome: Progressing   Problem: Education: Goal: Knowledge of General Education information will improve Description: Including pain rating scale, medication(s)/side effects and non-pharmacologic comfort measures Outcome: Progressing   Problem: Health Behavior/Discharge Planning: Goal: Ability to manage health-related needs will improve Outcome: Progressing   Problem: Clinical Measurements: Goal: Ability to maintain clinical measurements within normal limits will improve Outcome: Progressing Goal: Will remain free from infection Outcome: Progressing Goal: Diagnostic test results will improve Outcome: Progressing Goal: Respiratory complications will improve Outcome: Progressing Goal: Cardiovascular complication will be avoided Outcome: Progressing   Problem: Activity: Goal: Risk for activity intolerance will decrease Outcome: Progressing   Problem: Nutrition: Goal: Adequate nutrition will be maintained Outcome: Progressing   Problem: Coping: Goal: Level of anxiety will decrease Outcome: Progressing   Problem: Elimination: Goal: Will not experience complications related to bowel motility Outcome: Progressing Goal: Will not experience complications related to urinary retention Outcome: Progressing   Problem: Pain Managment: Goal: General experience of comfort will improve Outcome: Progressing   Problem: Safety: Goal: Ability to remain free from injury will improve Outcome: Progressing   Problem: Skin Integrity: Goal: Risk for impaired skin integrity will decrease Outcome: Progressing   

## 2019-06-11 MED ORDER — GALANTAMINE HYDROBROMIDE 4 MG PO TABS
12.0000 mg | ORAL_TABLET | Freq: Every day | ORAL | Status: DC
  Administered 2019-06-11 – 2019-07-02 (×22): 12 mg via ORAL
  Filled 2019-06-11 (×22): qty 3

## 2019-06-11 MED ORDER — ASPIRIN 81 MG PO CHEW
81.0000 mg | CHEWABLE_TABLET | Freq: Every day | ORAL | Status: DC
  Administered 2019-06-12 – 2019-07-02 (×21): 81 mg via ORAL
  Filled 2019-06-11 (×21): qty 1

## 2019-06-11 NOTE — Progress Notes (Signed)
Occupational Therapy Treatment Patient Details Name: Scott Gilbert MRN: [id number] DOB: February 01, 1930 Today's Date: 06/11/2019    History of present illness 86 y.o. male with past medical history significant for hypertension and mild dementia. He presented to the emergency department with sudden onset of slurred speech and aphasia, facial droop and weakness.   CT cerebral perfusion on 04/16/19 reported: "acute distal Left ACA territory ischemia and infarct."   OT comments  Patient continues to make steady progress towards goals in skilled OT session. Patient's session encompassed AAROM and PROM to RUE to increase functional mobility and transfers. Pt progressing with fine motor coordination to RUE, however continues to require tactile cues to LUE in order to prevent compensation. Pt with increased sitting balance at EOB, however when completing transfers, pt noted to have poster LOB requiring increased assist to transfer to chair. Pt continues to benefit from skilled therapy in order to address functional deficits; will follow acutely.    Follow Up Recommendations  Supervision/Assistance - 24 hour;SNF    Equipment Recommendations  Wheelchair (measurements OT);Wheelchair cushion (measurements OT);3 in 1 bedside commode    Recommendations for Other Services      Precautions / Restrictions Precautions Precautions: Fall Precaution Comments: short length KI Required Braces or Orthoses: Knee Immobilizer - Right;Other Brace Knee Immobilizer - Right: On when out of bed or walking Other Brace: R UE resting hand splint in room for use at night Restrictions Weight Bearing Restrictions: No       Mobility Bed Mobility Overal bed mobility: Needs Assistance Bed Mobility: Supine to Sit;Sit to Supine   Sidelying to sit: Mod assist       General bed mobility comments: Pt unable to bring legs off of bed without assistance to date, however with appropriate placement of UE's was able to use bed  rails to elevate trunk with min A provided at trunk from therapist  Transfers Overall transfer level: Needs assistance   Transfers: Sit to/from Stand Sit to Stand: Mod assist Stand pivot transfers: Mod assist;+2 physical assistance       General transfer comment: Attempted incremental scoot to strong side (L) as in sessions previous however posterior LOB noted therefore +2 stand pivot transfer completed with RN    Balance Overall balance assessment: Needs assistance Sitting-balance support: Single extremity supported;Feet supported Sitting balance-Leahy Scale: Fair Sitting balance - Comments: Supervision for safety intermittently switching which hand was supporting on bed Postural control: Right lateral lean;Posterior lean Standing balance support: Bilateral upper extremity supported;During functional activity Standing balance-Leahy Scale: Poor Standing balance comment: UE support and assist for balance                           ADL either performed or assessed with clinical judgement   ADL Overall ADL's : Needs assistance/impaired     Grooming: Wash/dry hands;Wash/dry face;Bed level;Sitting;Set up Grooming Details (indicate cue type and reason): min tactile cues for thoroughness, however pt used LUE instead of RUE                 Toilet Transfer: Moderate assistance;Cueing for safety;Cueing for sequencing;Stand-pivot;+2 for physical assistance Toilet Transfer Details (indicate cue type and reason): Mod A x1 (simualted with positioning armchair to strong side and transferring to recliner) with stand pivot with RN due to posterior LOB when completing incremental scoot         Functional mobility during ADLs: Maximal assistance;Cueing for sequencing;Moderate assistance;Cueing for safety General ADL Comments: Pt sat  EOB to complete various FMC exercises after grooming tasks to incorportate RUE into tasks. Pt noted to have posterior LOB at trunk during functional  transfers, however was able to demonstrate increased mobility in RUE     Vision       Perception     Praxis      Cognition Arousal/Alertness: Awake/alert Behavior During Therapy: [facility] for tasks assessed/performed Overall Cognitive Status: Impaired/Different from baseline Area of Impairment: Attention;Following commands;Safety/judgement;Awareness                   Current Attention Level: Selective   Following Commands: Follows one step commands inconsistently Safety/Judgement: Decreased awareness of safety;Decreased awareness of deficits Awareness: Emergent(R inattention noted)   General Comments: Following 1 step commands inconsistently, laughing occasionally, was able to verbalize name and location during orientation questions        Exercises Shoulder Exercises Shoulder Extension: AAROM;Right;10 reps Elbow Flexion: AAROM;Right;10 reps Elbow Extension: AAROM;Right;10 reps Digit Composite Flexion: PROM Hand Exercises Opposition: AROM;5 reps;Right Other Exercises Other Exercises: Therapist completed shoulder AAROM and elbow AAROM, focus on functional tasks of key pinch grasp and release on RUE, input given at thenar eminense to release, pt can now manipulate thumb in small circumduction pattern at tip of index, tone noted throughout RUE   Shoulder Instructions       General Comments      Pertinent Vitals/ Pain       Pain Assessment: No/denies pain  Home Living                                          Prior Functioning/Environment              Frequency  Min 2X/week        Progress Toward Goals  OT Goals(current goals can now be found in the care plan section)  Progress towards OT goals: Progressing toward goals  Acute Rehab OT Goals Patient Stated Goal: none stated OT Goal Formulation: Patient unable to participate in goal setting Time For Goal Achievement: 06/15/19 Potential to Achieve Goals: Fair  Plan Discharge plan  remains appropriate    Co-evaluation                 AM-PAC OT "6 Clicks" Daily Activity     Outcome Measure   Help from another person eating meals?: A Lot Help from another person taking care of personal grooming?: A Little Help from another person toileting, which includes using toliet, bedpan, or urinal?: A Lot Help from another person bathing (including washing, rinsing, drying)?: A Lot Help from another person to put on and taking off regular upper body clothing?: A Lot Help from another person to put on and taking off regular lower body clothing?: A Lot 6 Click Score: 13    End of Session Equipment Utilized During Treatment: Gait belt  OT Visit Diagnosis: Unsteadiness on feet (R26.81);Feeding difficulties (R63.3)   Activity Tolerance Patient tolerated treatment well   Patient Left in chair;with call bell/phone within reach;with chair alarm set;with nursing/sitter in room(Posey belt)   Nurse Communication Mobility status        Time: 3818-2993 OT Time Calculation (min): 36 min  Charges: OT General Charges $OT Visit: 1 Visit OT Treatments $Self Care/Home Management : 23-37 mins  Pollyann Glen E. Nettye Flegal, COTA/L Acute Rehabilitation Services [phone]   Cherlyn Cushing 06/11/2019, 10:19 AM

## 2019-06-11 NOTE — Plan of Care (Signed)
  Problem: Education: Goal: Knowledge of disease or condition will improve Outcome: Progressing Goal: Knowledge of secondary prevention will improve Outcome: Progressing Goal: Knowledge of patient specific risk factors addressed and post discharge goals established will improve Outcome: Progressing Goal: Individualized Educational Video(s) Outcome: Progressing   Problem: Education: Goal: Knowledge of General Education information will improve Description: Including pain rating scale, medication(s)/side effects and non-pharmacologic comfort measures Outcome: Progressing   Problem: Health Behavior/Discharge Planning: Goal: Ability to manage health-related needs will improve Outcome: Progressing   Problem: Clinical Measurements: Goal: Ability to maintain clinical measurements within normal limits will improve Outcome: Progressing Goal: Will remain free from infection Outcome: Progressing Goal: Diagnostic test results will improve Outcome: Progressing Goal: Respiratory complications will improve Outcome: Progressing Goal: Cardiovascular complication will be avoided Outcome: Progressing   Problem: Activity: Goal: Risk for activity intolerance will decrease Outcome: Progressing   Problem: Nutrition: Goal: Adequate nutrition will be maintained Outcome: Progressing   Problem: Coping: Goal: Level of anxiety will decrease Outcome: Progressing   Problem: Elimination: Goal: Will not experience complications related to bowel motility Outcome: Progressing Goal: Will not experience complications related to urinary retention Outcome: Progressing   Problem: Pain Managment: Goal: General experience of comfort will improve Outcome: Progressing   Problem: Safety: Goal: Ability to remain free from injury will improve Outcome: Progressing   Problem: Skin Integrity: Goal: Risk for impaired skin integrity will decrease Outcome: Progressing   

## 2019-06-11 NOTE — Plan of Care (Signed)
Contacted by RN that pt has difficulty swallow the large tablet of ASA EC as a whole. I will change to chewable ASA. Since it has been 2 months out of his stroke, to facilitate his swallowing of ASA pill, will just do ASA chewable 81mg  daily.   , MD PhD Stroke Neurology 06/11/2019 10:07 AM

## 2019-06-11 NOTE — Progress Notes (Signed)
Physical Therapy Treatment Patient Details Name: Scott Gilbert MRN: [id number] DOB: 13-Sep-1929 Today's Date: 06/11/2019    History of Present Illness 86 y.o. male with past medical history significant for hypertension and mild dementia. He presented to the emergency department with sudden onset of slurred speech and aphasia, facial droop and weakness.   CT cerebral perfusion on 04/16/19 reported: "acute distal Left ACA territory ischemia and infarct."    PT Comments    Pt reclined in chair on arrival.   Performed RLE HC/HS stretching to improve fit in brace.  PTA re tightened brace and progressed patient to gt training. Pt able to advance gt to 35 ft with x2 standing rest breaks and goof ability to follow commands.  He continues to buckle with in brace and required assistance to advance RLE forward.  Continue to recommend SNF placement.     Follow Up Recommendations  Supervision/Assistance - 24 hour;SNF     Equipment Recommendations  Wheelchair (measurements PT)    Recommendations for Other Services       Precautions / Restrictions Precautions Precautions: Fall Precaution Comments: short length KI Required Braces or Orthoses: Knee Immobilizer - Right;Other Brace Knee Immobilizer - Right: On when out of bed or walking Other Brace: R UE resting hand splint in room for use at night Restrictions Weight Bearing Restrictions: No    Mobility  Bed Mobility Overal bed mobility: Needs Assistance Bed Mobility: Supine to Sit;Sit to Supine   Sidelying to sit: Mod assist       General bed mobility comments: Pt seated in recliner on arrival.  Transfers Overall transfer level: Needs assistance Equipment used: Rolling walker (2 wheeled) Transfers: Sit to/from Stand Sit to Stand: Mod assist;Min assist Stand pivot transfers: Mod assist;+2 physical assistance       General transfer comment: Pt required min +1 from recliner and mod +1 from straight back seat in room after gt ( more  fatigued).  Ambulation/Gait Ambulation/Gait assistance: Mod assist(heavy moderate assistance.) Gait Distance (Feet): 35 Feet(+ 8 ft) Assistive device: Rolling walker (2 wheeled) Gait Pattern/deviations: Step-to pattern;Shuffle;Ataxic;Trunk flexed;Decreased dorsiflexion - right Gait velocity: decreased   General Gait Details: Pt required close chair follow , assistance to maintain path of RW and assistance to advance RLE forward.  Poor gt symmtery noted.  Continues to buckle within stance phase in brace.   Stairs             Wheelchair Mobility    Modified Rankin (Stroke Patients Only) Modified Rankin (Stroke Patients Only) Pre-Morbid Rankin Score: No symptoms Modified Rankin: Moderately severe disability     Balance Overall balance assessment: Needs assistance Sitting-balance support: Single extremity supported;Feet supported Sitting balance-Leahy Scale: Fair Sitting balance - Comments: Supervision for safety intermittently switching which hand was supporting on bed Postural control: Right lateral lean;Posterior lean Standing balance support: Bilateral upper extremity supported;During functional activity Standing balance-Leahy Scale: Poor Standing balance comment: UE support and assist for balance                            Cognition Arousal/Alertness: Awake/alert Behavior During Therapy: WFL for tasks assessed/performed Overall Cognitive Status: Impaired/Different from baseline Area of Impairment: Attention;Following commands;Safety/judgement;Awareness                   Current Attention Level: Selective   Following Commands: Follows one step commands inconsistently Safety/Judgement: Decreased awareness of safety;Decreased awareness of deficits Awareness: Emergent   General Comments: Pt following 1  step commands and noted to speak in a short sentences.      Exercises Shoulder Exercises Shoulder Extension: AAROM;Right;10 reps Elbow Flexion:  AAROM;Right;10 reps Elbow Extension: AAROM;Right;10 reps Digit Composite Flexion: PROM Hand Exercises Opposition: AROM;5 reps;Right Other Exercises Other Exercises: Therapist completed shoulder AAROM and elbow AAROM, focus on functional tasks of key pinch grasp and release on RUE, input given at thenar eminense to release, pt can now manipulate thumb in small circumduction pattern at tip of index, tone noted throughout RUE Other Exercises: R LE HS HC stretch 4x 15 sec.    General Comments        Pertinent Vitals/Pain Pain Assessment: No/denies pain Faces Pain Scale: No hurt    Home Living                      Prior Function            PT Goals (current goals can now be found in the care plan section) Acute Rehab PT Goals Patient Stated Goal: none stated Potential to Achieve Goals: Fair Progress towards PT goals: Progressing toward goals    Frequency    Min 3X/week      PT Plan Current plan remains appropriate    Co-evaluation              AM-PAC PT "6 Clicks" Mobility   Outcome Measure  Help needed turning from your back to your side while in a flat bed without using bedrails?: A Lot Help needed moving from lying on your back to sitting on the side of a flat bed without using bedrails?: A Lot Help needed moving to and from a bed to a chair (including a wheelchair)?: A Lot Help needed standing up from a chair using your arms (e.g., wheelchair or bedside chair)?: A Lot Help needed to walk in hospital room?: A Lot Help needed climbing 3-5 steps with a railing? : Total 6 Click Score: 11    End of Session Equipment Utilized During Treatment: Gait belt Activity Tolerance: Patient tolerated treatment well Patient left: in chair;with call bell/phone within reach;with chair alarm set Nurse Communication: Mobility status PT Visit Diagnosis: Other abnormalities of gait and mobility (R26.89);Hemiplegia and hemiparesis;Muscle weakness (generalized)  (M62.81) Hemiplegia - Right/Left: Right Hemiplegia - dominant/non-dominant: Dominant Hemiplegia - caused by: Cerebral infarction     Time: 1010-1038 PT Time Calculation (min) (ACUTE ONLY): 28 min  Charges:  $Gait Training: 8-22 mins $Therapeutic Activity: 8-22 mins                     Bonney Leitz , PTA Acute Rehabilitation Services Pager [phone] Office [phone]     Marsheila Alejo Artis Delay 06/11/2019, 10:54 AM

## 2019-06-11 NOTE — Progress Notes (Signed)
STROKE TEAM PROGRESS NOTE   INTERVAL HISTORY Patient sitting in chair.  No change in condition.    OBJECTIVE Vitals:   06/11/19 0859 06/11/19 0928 06/11/19 0945 06/11/19 1145  BP: (!) 161/81   138/60  Pulse: 74   67  Resp: 18 16 18 20   Temp: 98.8 F (37.1 C)   99.2 F (37.3 C)  TempSrc: Oral   Oral  SpO2: 95%   93%  Weight:       CBC:  Recent Labs  Lab 06/05/19 0515 06/09/19 0454  WBC 5.8 8.5  HGB 11.6* 10.9*  HCT 34.0* 32.3*  MCV 93.2 93.6  PLT 209 193   Basic Metabolic Panel:  Recent Labs  Lab 06/05/19 0515 06/09/19 0454  NA 136 134*  K 3.9 3.8  CL 103 102  CO2 24 24  GLUCOSE 103* 108*  BUN 23 20  CREATININE 1.22 1.20  CALCIUM 8.6* 8.4*    IMAGING past 24h No results found.   PHYSICAL EXAM      Disoriented completely. Awake, alert Unable to lift RLE but otherwise ok with RUE, LUE, and LLE.   Babinski right. Grimaces to pain in RLE.  ASSESSMENT/PLAN [name] is a 86 y.o. male with history of hypertension and  mild dementia (medical care at [facility]), presents to the emergency department with sudden onset of slurred speech, aphasia, left gaze preference with right-sided neglect, right hemiparesis, facial droop and weakness. He received IV t-PA Friday 04/16/19 at 2230.  Stroke: Left ACA territory infarct, likely due to large vessel disease although cardioembolic source not excluded  Consider inpt loop recorder placement on discharge   VTE prophylaxis - lovenox  No antithrombotic prior to admission, now on ASA 325 and plavix DAPT for 3 months and then ASA alone given significant intracranial stenosis  Palliative consult 04/23/19 - Full Code - plan OP referral to Hospice. Continue to follow pt as an IP, routine attempts to contact family.  Therapy recommendations: SNF   Disposition:  Pending - pt without insurance coverage - SW continues to be involved. The pts son has petitioned guardianship with the courts, and there is a court date scheduled  for 06/09/19.   Medically ready for d/c once d/c plan determined.   Hypertension  Off lisinopril due to elevated Cre  On norvasc 5 -> 10 mg  On labetalol 100mg  bid . 1 elevated BP 219/199 recheck 180/62 . Monitor  . BP goal normotensive  Hyperlipidemia  LDL 113, goal < 70  Current lipid lowering medication: on lipitor 40  Continue statin at discharge  Dysphagia . Secondary to stroke . D1 thin liquids->D2 thin liquids . Speech on board . Pt with good po intake eating 100% meals and snacks . On IVF at 50/h  AKI on CKD 3a   Cre 1.2  Have repetitively stopped IVF after staff education to encourage PO, but Cre always goes up  Continue NS 50cc/hr   Other Stroke Risk Factors  Advanced age  Hx stroke/TIA - by imaging  Other Active Problems  Dementia - on galantamine and Risperdal   Hyponatremia - 134   Dressing removed to L groin 3/18; still red, but no exudate.   Court date today for guardianship.  Lawyer to notify SW of outcome.  Hospital day # 56  Impression:  1. Severe dementia - severe global atrophy on MRI Brain.  Cont Galantamine, but will increase dose toward therapeutic level.   2. Left ACA distribution ischemic infarct with RLE  paresis.  Cont ASA + Plavix + statin.  3. Awaiting SNF placement.  [name], MS, MD       To contact Stroke Continuity provider, please refer to [email or web address] After hours, contact General Neurology

## 2019-06-12 LAB — BASIC METABOLIC PANEL
Anion gap: 9 (ref 5–15)
BUN: 19 mg/dL (ref 8–23)
CO2: 25 mmol/L (ref 22–32)
Calcium: 8.8 mg/dL — ABNORMAL LOW (ref 8.9–10.3)
Chloride: 100 mmol/L (ref 98–111)
Creatinine, Ser: 1.2 mg/dL (ref 0.61–1.24)
GFR calc Af Amer: >60 mL/min (ref >60)
GFR calc non Af Amer: 54 mL/min — ABNORMAL LOW (ref >60)
Glucose, Bld: 103 mg/dL — ABNORMAL HIGH (ref 70–99)
Potassium: 3.7 mmol/L (ref 3.5–5.1)
Sodium: 134 mmol/L — ABNORMAL LOW (ref 135–145)

## 2019-06-12 LAB — CBC
HCT: 33.8 % — ABNORMAL LOW (ref 39.0–52.0)
Hemoglobin: 11.6 g/dL — ABNORMAL LOW (ref 13.0–17.0)
MCH: 32 pg (ref 26.0–34.0)
MCHC: 34.3 g/dL (ref 30.0–36.0)
MCV: 93.1 fL (ref 80.0–100.0)
Platelets: 257 10*3/uL (ref 150–400)
RBC: 3.63 MIL/uL — ABNORMAL LOW (ref 4.22–5.81)
RDW: 12.4 % (ref 11.5–15.5)
WBC: 6.9 10*3/uL (ref 4.0–10.5)
nRBC: 0 % (ref 0.0–0.2)

## 2019-06-12 NOTE — Progress Notes (Signed)
STROKE TEAM PROGRESS NOTE   INTERVAL HISTORY Patient laying in bed.  No change in condition.    OBJECTIVE Vitals:   06/11/19 2312 06/12/19 0317 06/12/19 0738 06/12/19 1112  BP: 134/60 (!) 152/63 138/70 140/62  Pulse: 73 78 78 67  Resp: 17 15 17 18   Temp: 97.9 F (36.6 C) 98.7 F (37.1 C) 98.6 F (37 C) 98.1 F (36.7 C)  TempSrc: Oral Oral Oral Oral  SpO2: 96% 96% 96% 95%  Weight:       CBC:  Recent Labs  Lab 06/09/19 0454 06/12/19 0510  WBC 8.5 6.9  HGB 10.9* 11.6*  HCT 32.3* 33.8*  MCV 93.6 93.1  PLT 193 161   Basic Metabolic Panel:  Recent Labs  Lab 06/09/19 0454 06/12/19 0510  NA 134* 134*  K 3.8 3.7  CL 102 100  CO2 24 25  GLUCOSE 108* 103*  BUN 20 19  CREATININE 1.20 1.20  CALCIUM 8.4* 8.8*    IMAGING past 24h No results found.   PHYSICAL EXAM      Disoriented completely, except knows city, state. Awake, alert Unable to lift RLE but otherwise ok with RUE, LUE, and LLE.   Babinski right. Grimaces to pain in RLE and minor withdrawal.  .  ASSESSMENT/PLAN Scott Gilbert is a 86 y.o. male with history of hypertension and  mild dementia (medical care at [facility]), presents to the emergency department with sudden onset of slurred speech, aphasia, left gaze preference with right-sided neglect, right hemiparesis, facial droop and weakness. He received IV t-PA Friday 04/16/19 at 2230.  Stroke: Left ACA territory infarct, likely due to large vessel disease although cardioembolic source not excluded  Consider inpt loop recorder placement on discharge   VTE prophylaxis - lovenox  No antithrombotic prior to admission, now on ASA 325 and plavix DAPT for 3 months and then ASA alone given significant intracranial stenosis  Palliative consult 04/23/19 - Full Code - plan OP referral to Hospice. Continue to follow pt as an IP, routine attempts to contact family.  Therapy recommendations: SNF   Disposition:  Pending - pt without insurance coverage - SW continues  to be involved. The pts son has petitioned guardianship with the courts, and there is a court date scheduled for 06/09/19.   Medically ready for d/c once d/c plan determined.   Hypertension  Off lisinopril due to elevated Cre  On norvasc 5 -> 10 mg  On labetalol 100mg  bid . 1 elevated BP 219/199 recheck 180/62 . Monitor  . BP goal normotensive  Hyperlipidemia  LDL 113, goal < 70  Current lipid lowering medication: on lipitor 40  Continue statin at discharge  Dysphagia . Secondary to stroke . D1 thin liquids->D2 thin liquids . Speech on board . Pt with good po intake eating 100% meals and snacks . On IVF at 50/h  AKI on CKD 3a   Cre 1.2  Have repetitively stopped IVF after staff education to encourage PO, but Cre always goes up  Continue NS 50cc/hr   Other Stroke Risk Factors  Advanced age  Hx stroke/TIA - by imaging  Other Active Problems  Dementia - on galantamine and Risperdal   Hyponatremia - 134   Dressing removed to L groin 3/18; still red, but no exudate.   Court date today for guardianship.  Lawyer to notify SW of outcome.  Hospital day # 57  Impression:  1. Severe dementia - severe global atrophy on MRI Brain.  Cont Galantamine.  2.  Left ACA distribution ischemic infarct with RLE paresis.  Cont ASA + Plavix + statin.  3. Awaiting SNF placement.  Weston Settle, MS, MD          To contact Stroke Continuity provider, please refer to [email or web address]. After hours, contact General Neurology

## 2019-06-13 NOTE — Progress Notes (Signed)
STROKE TEAM PROGRESS NOTE   INTERVAL HISTORY Patient laying in bed.  No change in condition.    OBJECTIVE Vitals:   06/12/19 1506 06/12/19 1929 06/12/19 2338 06/13/19 0334  BP: 105/60 (!) 132/50 (!) 131/55 (!) 154/71  Pulse: 75 73 68 77  Resp: 18 20 18 18   Temp: 98.2 F (36.8 C) 98.5 F (36.9 C) 98.4 F (36.9 C) 97.7 F (36.5 C)  TempSrc: Oral Oral Oral Oral  SpO2: 95% 93% 98% 95%  Weight:       CBC:  Recent Labs  Lab 06/09/19 0454 06/12/19 0510  WBC 8.5 6.9  HGB 10.9* 11.6*  HCT 32.3* 33.8*  MCV 93.6 93.1  PLT 193 458   Basic Metabolic Panel:  Recent Labs  Lab 06/09/19 0454 06/12/19 0510  NA 134* 134*  K 3.8 3.7  CL 102 100  CO2 24 25  GLUCOSE 108* 103*  BUN 20 19  CREATININE 1.20 1.20  CALCIUM 8.4* 8.8*    IMAGING past 24h No results found.   PHYSICAL EXAM      Disoriented completely, except knows city, state. Awake, alert Unable to lift RLE but otherwise ok with RUE, LUE, and LLE.   Babinski right. Grimaces to pain in RLE and minor withdrawal.  .  ASSESSMENT/PLAN [name] is a 86 y.o. male with history of hypertension and  mild dementia (medical care at [facility]), presents to the emergency department with sudden onset of slurred speech, aphasia, left gaze preference with right-sided neglect, right hemiparesis, facial droop and weakness. He received IV t-PA Friday 04/16/19 at 2230.  Stroke: Left ACA territory infarct, likely due to large vessel disease although cardioembolic source not excluded  Consider inpt loop recorder placement on discharge   VTE prophylaxis - lovenox  No antithrombotic prior to admission, now on ASA 325 and plavix DAPT for 3 months and then ASA alone given significant intracranial stenosis  Palliative consult 04/23/19 - Full Code - plan OP referral to Hospice. Continue to follow pt as an IP, routine attempts to contact family.  Therapy recommendations: SNF   Disposition:  Pending - pt without insurance coverage - SW  continues to be involved. The pts son has petitioned guardianship with the courts, and there is a court date scheduled for 06/09/19.   Medically ready for d/c once d/c plan determined.   Hypertension  Off lisinopril due to elevated Cre  On norvasc 5 -> 10 mg  On labetalol 100mg  bid . 1 elevated BP 219/199 recheck 180/62 . Monitor  . BP goal normotensive  Hyperlipidemia  LDL 113, goal < 70  Current lipid lowering medication: on lipitor 40  Continue statin at discharge  Dysphagia . Secondary to stroke . D1 thin liquids->D2 thin liquids . Speech on board . Pt with good po intake eating 100% meals and snacks . On IVF at 50/h  AKI on CKD 3a   Cre 1.2  Have repetitively stopped IVF after staff education to encourage PO, but Cre always goes up  Continue NS 50cc/hr   Other Stroke Risk Factors  Advanced age  Hx stroke/TIA - by imaging  Other Active Problems  Dementia - on galantamine and Risperdal   Hyponatremia - 134   Dressing removed to L groin 3/18; still red, but no exudate.   Court date today for guardianship.  Lawyer to notify SW of outcome.  Hospital day # 58  Impression:  1. Severe dementia - severe global atrophy on MRI Brain.  Cont Galantamine.  2. Left ACA distribution ischemic infarct with RLE paresis.  Cont ASA + Plavix + statin.  3. Awaiting SNF placement.  Weston Settle, MS, MD          To contact Stroke Continuity provider, please refer to [email or web address]. After hours, contact General Neurology

## 2019-06-14 NOTE — Progress Notes (Signed)
Physical Therapy Treatment Patient Details Name: Scott Gilbert MRN: [id number] DOB: [birth year] Today's Date: 06/14/2019    History of Present Illness 86 y.o. male with past medical history significant for hypertension and mild dementia. He presented to the emergency department with sudden onset of slurred speech and aphasia, facial droop and weakness.   CT cerebral perfusion on 04/16/19 reported: "acute distal Left ACA territory ischemia and infarct."    PT Comments    Pt supine in bed on arrival.  He was noted with RLE drawn up into knee flexion and R hip in external rotation.  Worked on stretching R HC and HS to brace LE for OOB mobility.  Post session noted with yellowish bruise on L distal lateral LE.  Informed nurse of finding.  Pt continues to benefit from SNF placement.    Follow Up Recommendations  Supervision/Assistance - 24 hour;SNF     Equipment Recommendations  Wheelchair (measurements PT)    Recommendations for Other Services       Precautions / Restrictions Precautions Precautions: Fall Precaution Comments: short length KI Required Braces or Orthoses: Knee Immobilizer - Right;Other Brace Knee Immobilizer - Right: (for therapy use) Other Brace: R UE resting hand splint in room for use at night Restrictions Other Position/Activity Restrictions: L    Mobility  Bed Mobility Overal bed mobility: Needs Assistance Bed Mobility: Supine to Sit;Sit to Supine Rolling: Mod assist Sidelying to sit: Mod assist Supine to sit: Mod assist     General bed mobility comments: Assistance to mobilize to edge of bed with use of bed pad and tactile cues for facilitation of hand placement.  Transfers Overall transfer level: Needs assistance Equipment used: Rolling walker (2 wheeled) Transfers: Sit to/from Stand Sit to Stand: Mod assist;+2 safety/equipment         General transfer comment: Cues for hand placement and heavy moderate assistance to boost into standing.   He is  noted to buckle in L stance phase which is not like him  Ambulation/Gait Ambulation/Gait assistance: Max assist Gait Distance (Feet): 8 Feet(x2) Assistive device: Rolling walker (2 wheeled) Gait Pattern/deviations: Step-to pattern;Shuffle;Ataxic;Trunk flexed;Decreased dorsiflexion - right     General Gait Details: Pt required manual facilitation to move RLE forward he was noted to buckle withing the brace on the right and also in stance phase on the L  He appears more fatigued this session with difficultly following commands and noted lateral lean to teh R.   Stairs             Wheelchair Mobility    Modified Rankin (Stroke Patients Only) Modified Rankin (Stroke Patients Only) Pre-Morbid Rankin Score: No symptoms Modified Rankin: Moderately severe disability     Balance Overall balance assessment: Needs assistance Sitting-balance support: Single extremity supported;Feet supported Sitting balance-Leahy Scale: Fair Sitting balance - Comments: Supervision for safety intermittently switching which hand was supporting on bed Postural control: Right lateral lean;Posterior lean   Standing balance-Leahy Scale: Poor Standing balance comment: UE support and assist for balance                            Cognition Arousal/Alertness: Awake/alert Behavior During Therapy: WFL for tasks assessed/performed Overall Cognitive Status: Impaired/Different from baseline Area of Impairment: Attention;Following commands;Safety/judgement;Awareness                   Current Attention Level: Selective   Following Commands: Follows one step commands inconsistently Safety/Judgement: Decreased awareness of safety;Decreased awareness  of deficits     General Comments: Pt not very vocal but some words used here of there during session.      Exercises Other Exercises Other Exercises: R LE HS HC stretch 1x 30  sec.    General Comments        Pertinent Vitals/Pain Pain  Assessment: Faces Faces Pain Scale: Hurts little more Pain Location: R lower lateral shin with noted brusing Pain Descriptors / Indicators: Tender;Discomfort Pain Intervention(s): Monitored during session;Repositioned    Home Living                      Prior Function            PT Goals (current goals can now be found in the care plan section) Acute Rehab PT Goals Patient Stated Goal: none stated Potential to Achieve Goals: Fair Progress towards PT goals: Progressing toward goals    Frequency    Min 3X/week      PT Plan Current plan remains appropriate    Co-evaluation              AM-PAC PT "6 Clicks" Mobility   Outcome Measure  Help needed turning from your back to your side while in a flat bed without using bedrails?: A Lot Help needed moving from lying on your back to sitting on the side of a flat bed without using bedrails?: A Lot Help needed moving to and from a bed to a chair (including a wheelchair)?: A Lot Help needed standing up from a chair using your arms (e.g., wheelchair or bedside chair)?: A Lot Help needed to walk in hospital room?: A Lot Help needed climbing 3-5 steps with a railing? : Total 6 Click Score: 11    End of Session Equipment Utilized During Treatment: Gait belt Activity Tolerance: Patient tolerated treatment well Patient left: in chair;with call bell/phone within reach;with chair alarm set(sitter alarm belt donned) Nurse Communication: Mobility status(informed nurse of bruise on L lateral shin) PT Visit Diagnosis: Other abnormalities of gait and mobility (R26.89);Hemiplegia and hemiparesis;Muscle weakness (generalized) (M62.81) Hemiplegia - Right/Left: Right Hemiplegia - dominant/non-dominant: Dominant Hemiplegia - caused by: Cerebral infarction     Time: 5035-4656 PT Time Calculation (min) (ACUTE ONLY): 26 min  Charges:  $Gait Training: 8-22 mins                     Erasmo Leventhal , PTA Acute Rehabilitation  Services Pager [phone] Office [phone]     Anali Cabanilla [name] 06/14/2019, 2:19 PM

## 2019-06-14 NOTE — Progress Notes (Signed)
Occupational Therapy Treatment Patient Details Name: Scott Gilbert MRN: [id number] DOB: 07-15-1929 Today's Date: 06/14/2019    History of present illness 86 y.o. male with past medical history significant for hypertension and mild dementia. He presented to the emergency department with sudden onset of slurred speech and aphasia, facial droop and weakness.   CT cerebral perfusion on 04/16/19 reported: "acute distal Left ACA territory ischemia and infarct."   OT comments  Patient continues to make steady progress towards goals in skilled OT session. Patient's session encompassed co-treat with PT to further address functional deficits in ambulation as well as progress in increased independence in standing at sink and completing ADLs. Pt noted to require increased time and multi-modal cues to follow one step commands in session, as well as a demonstration of LLE buckle when ambulating. Pt noted to be able to circumduct R thumb in session, being able to use this as a compensatory strategy to release. Of note, when pt is prompted to grab an additional item with RUE while grasping another item, pt is able to release and grab other object 50% of the time without having to incorporate LUE. Pt would continue to benefit from skilled services; will follow acutely.    Follow Up Recommendations  Supervision/Assistance - 24 hour;SNF    Equipment Recommendations  Wheelchair (measurements OT);Wheelchair cushion (measurements OT);3 in 1 bedside commode    Recommendations for Other Services      Precautions / Restrictions Precautions Precautions: Fall Precaution Comments: short length KI Required Braces or Orthoses: Knee Immobilizer - Right;Other Brace Knee Immobilizer - Right: (for therapy use) Other Brace: R UE resting hand splint in room for use at night Restrictions Other Position/Activity Restrictions: L       Mobility Bed Mobility Overal bed mobility: Needs Assistance Bed Mobility: Supine to  Sit;Sit to Supine Rolling: Mod assist Sidelying to sit: Mod assist Supine to sit: Mod assist     General bed mobility comments: Assistance to mobilize to edge of bed with use of bed pad and tactile cues for facilitation of hand placement.  Transfers Overall transfer level: Needs assistance Equipment used: Rolling walker (2 wheeled) Transfers: Sit to/from Stand Sit to Stand: Mod assist;+2 safety/equipment         General transfer comment: Cues for hand placement and heavy moderate assistance to boost into standing.   He is noted to buckle in L stance phase which is atypical    Balance Overall balance assessment: Needs assistance Sitting-balance support: Single extremity supported;Feet supported Sitting balance-Leahy Scale: Fair Sitting balance - Comments: Supervision for safety intermittently switching which hand was supporting on bed Postural control: Right lateral lean;Posterior lean Standing balance support: Bilateral upper extremity supported;During functional activity Standing balance-Leahy Scale: Poor Standing balance comment: UE support and assist for balance                           ADL either performed or assessed with clinical judgement   ADL Overall ADL's : Needs assistance/impaired     Grooming: Wash/dry hands;Wash/dry face;Standing;Cueing for sequencing;Minimal assistance Grooming Details (indicate cue type and reason): pt requiring increased cues to complete despite use of LUE primarily, would attempt to incorporate RUE with max cues                 Toilet Transfer: Moderate assistance;Cueing for safety;Cueing for sequencing;+2 for physical assistance;Ambulation Toilet Transfer Details (indicate cue type and reason): Min/Mod A x2 to complete in ambulation bed<>sink<>chair (noted  that the LLE is now beginning to buckle in ambulation)         Functional mobility during ADLs: Moderate assistance;Cueing for safety;Cueing for sequencing;+2 for  physical assistance General ADL Comments: Mod A x2 in standing as pt requires bracing at R side in order to remain upright (pt noted to be leaning R with increased force in session to date, fatiguing much quicker than in previos sessions) pt noted to require increased time and cues to follow one step commands at sink, often deviaiting from intended task     Vision       Perception     Praxis      Cognition Arousal/Alertness: Awake/alert Behavior During Therapy: [facility] for tasks assessed/performed Overall Cognitive Status: Impaired/Different from baseline Area of Impairment: Attention;Following commands;Safety/judgement;Awareness                   Current Attention Level: Selective   Following Commands: Follows one step commands inconsistently Safety/Judgement: Decreased awareness of safety;Decreased awareness of deficits Awareness: Emergent   General Comments: Pt able to state a few words in session, however not as communicative as previous sessions        Exercises Other Exercises Other Exercises: R LE HS HC stretch 1x 30  sec.   Shoulder Instructions       General Comments      Pertinent Vitals/ Pain       Pain Assessment: Faces Faces Pain Scale: Hurts little more Pain Location: R lower lateral shin with noted brusing Pain Descriptors / Indicators: Tender;Discomfort Pain Intervention(s): Limited activity within patient's tolerance;Repositioned  Home Living                                          Prior Functioning/Environment              Frequency  Min 2X/week        Progress Toward Goals  OT Goals(current goals can now be found in the care plan section)  Progress towards OT goals: Progressing toward goals  Acute Rehab OT Goals Patient Stated Goal: none stated OT Goal Formulation: Patient unable to participate in goal setting Time For Goal Achievement: 06/15/19 Potential to Achieve Goals: [place] Discharge plan remains  appropriate    Co-evaluation    PT/OT/SLP Co-Evaluation/Treatment: Yes Reason for Co-Treatment: Complexity of the patient's impairments (multi-system involvement);Necessary to address cognition/behavior during functional activity;For patient/therapist safety;To address functional/ADL transfers   OT goals addressed during session: ADL's and self-care;Strengthening/ROM      AM-PAC OT "6 Clicks" Daily Activity     Outcome Measure   Help from another person eating meals?: A Lot Help from another person taking care of personal grooming?: A Little Help from another person toileting, which includes using toliet, bedpan, or urinal?: A Lot Help from another person bathing (including washing, rinsing, drying)?: A Lot Help from another person to put on and taking off regular upper body clothing?: A Lot Help from another person to put on and taking off regular lower body clothing?: A Lot 6 Click Score: 13    End of Session Equipment Utilized During Treatment: Gait belt;Rolling walker  OT Visit Diagnosis: Unsteadiness on feet (R26.81);Feeding difficulties (R63.3)   Activity Tolerance Patient tolerated treatment well;Patient limited by fatigue   Patient Left in chair;with call bell/phone within reach;with chair alarm set;with nursing/sitter in room(posey belt)   Nurse Communication Mobility  status        Time: 4718-5501 OT Time Calculation (min): 26 min  Charges: OT General Charges $OT Visit: 1 Visit OT Treatments $Self Care/Home Management : 8-22 mins  Pollyann Glen E. Tony Granquist, COTA/L Acute Rehabilitation Services [phone]   Cherlyn Cushing 06/14/2019, 2:35 PM

## 2019-06-14 NOTE — Progress Notes (Signed)
STROKE TEAM PROGRESS NOTE   INTERVAL HISTORY He is sitting up in bed.  Is pleasant and more interactive today and speaks a few words and short sentences.   . No new complaints.  OBJECTIVE Vitals:   06/13/19 2328 06/14/19 0359 06/14/19 0834 06/14/19 1204  BP: 139/63 (!) 147/63 (!) 165/67 (!) 143/59  Pulse: 77 78 75 70  Resp: 18 18 16 20   Temp: 97.7 F (36.5 C) 98.1 F (36.7 C) 98.5 F (36.9 C) 98.2 F (36.8 C)  TempSrc: Oral Oral Oral Oral  SpO2: 96% 94% 96% 97%  Weight:       CBC:  Recent Labs  Lab 06/09/19 0454 06/12/19 0510  WBC 8.5 6.9  HGB 10.9* 11.6*  HCT 32.3* 33.8*  MCV 93.6 93.1  PLT 193 257   Basic Metabolic Panel:  Recent Labs  Lab 06/09/19 0454 06/12/19 0510  NA 134* 134*  K 3.8 3.7  CL 102 100  CO2 24 25  GLUCOSE 108* 103*  BUN 20 19  CREATININE 1.20 1.20  CALCIUM 8.4* 8.8*    IMAGING past 24h No results found.   PHYSICAL EXAM      He is awake alert and interactive.  His speech is nonfluent but can speak few words and short sentences.  Follows simple commands. Unable to lift RLE but otherwise ok with RUE, LUE, and LLE.   Babinski right. Grimaces to pain in RLE and minor withdrawal.  .  ASSESSMENT/PLAN Scott Gilbert is a 86 y.o. male with history of hypertension and  mild dementia (medical care at [facility]), presents to the emergency department with sudden onset of slurred speech, aphasia, left gaze preference with right-sided neglect, right hemiparesis, facial droop and weakness. He received IV t-PA Friday 04/16/19 at 2230.  Stroke: Left ACA territory infarct, likely due to large vessel disease although cardioembolic source not excluded  Consider inpt loop recorder placement on discharge   VTE prophylaxis - lovenox  No antithrombotic prior to admission, now on ASA 325 and plavix DAPT for 3 months and then ASA alone given significant intracranial stenosis  Palliative consult 04/23/19 - Full Code - plan OP referral to Hospice. Continue to  follow pt as an IP, routine attempts to contact family.  Therapy recommendations: SNF   Disposition:  Pending - pt without insurance coverage - SW continues to be involved. The pts son has petitioned guardianship with the courts, and there is a court date scheduled for 06/09/19.   Medically ready for d/c once d/c plan determined.   Hypertension  Off lisinopril due to elevated Cre  On norvasc 5 -> 10 mg  On labetalol 100mg  bid . 1 elevated BP 219/199 recheck 180/62 . Monitor  . BP goal normotensive  Hyperlipidemia  LDL 113, goal < 70  Current lipid lowering medication: on lipitor 40  Continue statin at discharge  Dysphagia . Secondary to stroke . D1 thin liquids->D2 thin liquids . Speech on board . Pt with good po intake eating 100% meals and snacks . Off IVF   AKI on CKD 3a   Cre 1.2  Have repetitively stopped IVF after staff education to encourage PO, but Cre always goes up  Off IVF  Monitor Cre  Other Stroke Risk Factors  Advanced age  Hx stroke/TIA - by imaging  Other Active Problems  Dementia - on galantamine and Risperdal   Hyponatremia - 134   Dressing removed to L groin 3/18; still red, but no exudate.   Court date  today for guardianship.  Lawyer to notify SW of outcome.  Hospital day # 79  Patient remains difficult to place in a skilled nursing facility due to lack of insurance.  He remains medically stable for transfer if bed available.  Continue ongoing management.  No changes Antony Contras, MD         To contact Stroke Continuity provider, please refer to [email or web address] After hours, contact General Neurology

## 2019-06-15 NOTE — Progress Notes (Signed)
STROKE TEAM PROGRESS NOTE   INTERVAL HISTORY No changes.  Vital signs stable.  Neurological unchanged.  OBJECTIVE Vitals:   06/15/19 0007 06/15/19 0355 06/15/19 0856 06/15/19 1228  BP: 139/62 (!) 120/47 (!) 142/65 140/61  Pulse: 78 72 70 75  Resp: 18 19 17 16   Temp: 98 F (36.7 C) 98.2 F (36.8 C) (!) 97.5 F (36.4 C) 98 F (36.7 C)  TempSrc: Oral Oral Oral Oral  SpO2: 96% 94% 95% 95%  Weight:       CBC:  Recent Labs  Lab 06/09/19 0454 06/12/19 0510  WBC 8.5 6.9  HGB 10.9* 11.6*  HCT 32.3* 33.8*  MCV 93.6 93.1  PLT 193 323   Basic Metabolic Panel:  Recent Labs  Lab 06/09/19 0454 06/12/19 0510  NA 134* 134*  K 3.8 3.7  CL 102 100  CO2 24 25  GLUCOSE 108* 103*  BUN 20 19  CREATININE 1.20 1.20  CALCIUM 8.4* 8.8*    IMAGING past 24h No results found.   PHYSICAL EXAM      He is awake alert and interactive.  His speech is nonfluent but can speak few words and short sentences.  Follows simple commands. Unable to lift RLE but otherwise ok with RUE, LUE, and LLE.   Babinski right. Grimaces to pain in RLE and minor withdrawal.  .  ASSESSMENT/PLAN Scott Gilbert is a 86 y.o. male with history of hypertension and  mild dementia (medical care at [facility]), presents to the emergency department with sudden onset of slurred speech, aphasia, left gaze preference with right-sided neglect, right hemiparesis, facial droop and weakness. He received IV t-PA Friday 04/16/19 at 2230.  Stroke: Left ACA territory infarct, likely due to large vessel disease although cardioembolic source not excluded  Consider inpt loop recorder placement on discharge   VTE prophylaxis - lovenox  No antithrombotic prior to admission, now on ASA 325 and plavix DAPT for 3 months and then ASA alone given significant intracranial stenosis  Palliative consult 04/23/19 - Full Code - plan OP referral to Hospice. Continue to follow pt as an IP, routine attempts to contact family.  Therapy  recommendations: SNF   Disposition:  Pending - pt without insurance coverage - SW continues to be involved. The pts son has petitioned guardianship with the courts, and there is a court date scheduled for 06/09/19.   Medically ready for d/c once d/c plan determined.   Hypertension  Off lisinopril due to elevated Cre  On norvasc 5 -> 10 mg  On labetalol 100mg  bid . 1 elevated BP 219/199 recheck 180/62 . Monitor  . BP goal normotensive  Hyperlipidemia  LDL 113, goal < 70  Current lipid lowering medication: on lipitor 40  Continue statin at discharge  Dysphagia . Secondary to stroke . D1 thin liquids->D2 thin liquids . Speech on board . Pt with good po intake eating 100% meals and snacks . Off IVF   AKI on CKD 3a   Cre 1.2  Have repetitively stopped IVF after staff education to encourage PO, but Cre always goes up  Off IVF  Monitor Cre  Other Stroke Risk Factors  Advanced age  Hx stroke/TIA - by imaging  Other Active Problems  Dementia - on galantamine and Risperdal   Hyponatremia - 134   Dressing removed to L groin 3/18; still red, but no exudate.   Court date today for guardianship.  Lawyer to notify SW of outcome.  Hospital day # 60  Patient remains  difficult to place in a skilled nursing facility due to lack of insurance.  He remains medically stable for transfer if bed available.  Continue ongoing management.  No changes.  Check CBC and BMP tomorrow [name], MD         To contact Stroke Continuity provider, please refer to [email or web address]. After hours, contact General Neurology

## 2019-06-16 LAB — CBC
HCT: 36 % — ABNORMAL LOW (ref 39.0–52.0)
Hemoglobin: 12.3 g/dL — ABNORMAL LOW (ref 13.0–17.0)
MCH: 31.5 pg (ref 26.0–34.0)
MCHC: 34.2 g/dL (ref 30.0–36.0)
MCV: 92.3 fL (ref 80.0–100.0)
Platelets: 272 10*3/uL (ref 150–400)
RBC: 3.9 MIL/uL — ABNORMAL LOW (ref 4.22–5.81)
RDW: 12.2 % (ref 11.5–15.5)
WBC: 6.2 10*3/uL (ref 4.0–10.5)
nRBC: 0 % (ref 0.0–0.2)

## 2019-06-16 LAB — BASIC METABOLIC PANEL
Anion gap: 12 (ref 5–15)
BUN: 21 mg/dL (ref 8–23)
CO2: 23 mmol/L (ref 22–32)
Calcium: 8.9 mg/dL (ref 8.9–10.3)
Chloride: 98 mmol/L (ref 98–111)
Creatinine, Ser: 1.19 mg/dL (ref 0.61–1.24)
GFR calc Af Amer: >60 mL/min (ref >60)
GFR calc non Af Amer: 55 mL/min — ABNORMAL LOW (ref >60)
Glucose, Bld: 108 mg/dL — ABNORMAL HIGH (ref 70–99)
Potassium: 3.7 mmol/L (ref 3.5–5.1)
Sodium: 133 mmol/L — ABNORMAL LOW (ref 135–145)

## 2019-06-16 NOTE — Progress Notes (Signed)
Physical Therapy Treatment Patient Details Name: Scott Gilbert MRN: [id number] DOB: 04/20/1929 Today's Date: 06/16/2019    History of Present Illness 86 y.o. male with past medical history significant for hypertension and mild dementia. He presented to the emergency department with sudden onset of slurred speech and aphasia, facial droop and weakness.   CT cerebral perfusion on 04/16/19 reported: "acute distal Left ACA territory ischemia and infarct."    PT Comments    Pt supine in bed soiled in urine.  He required moderate assistance to move to edge of bed and to stand with R knee blocked.  Performed steps from bed to recliner with max+2 facilitating R knee extension in stance phase and progressing steps with RLE.      Follow Up Recommendations  Supervision/Assistance - 24 hour;SNF     Equipment Recommendations  Wheelchair (measurements PT)    Recommendations for Other Services Rehab consult     Precautions / Restrictions Precautions Precautions: Fall Precaution Comments: short length KI Required Braces or Orthoses: Knee Immobilizer - Right;Other Brace Other Brace: R UE resting hand splint in room for use at night Restrictions Weight Bearing Restrictions: No    Mobility  Bed Mobility Overal bed mobility: Needs Assistance Bed Mobility: Supine to Sit;Sit to Supine Rolling: Mod assist Sidelying to sit: Mod assist       General bed mobility comments: Increased time to problem solve with hand over hand placement for RUE and cues to push trunk into a seated position.  PTA required assistance to move into sitting  Transfers Overall transfer level: Needs assistance Equipment used: Rolling walker (2 wheeled) Transfers: Sit to/from Stand Sit to Stand: Mod assist;+2 safety/equipment         General transfer comment: Pt able to come to standing with blocking of R knee, deferred use of knee immobilizer due to pain IN R upper thigh.  Pt required assistance to boost into  standing.  Ambulation/Gait Ambulation/Gait assistance: Max assist;+2 physical assistance Gait Distance (Feet): 4 Feet(steps from bed to recliner.) Assistive device: Rolling walker (2 wheeled) Gait Pattern/deviations: Step-to pattern;Shuffle;Ataxic;Trunk flexed;Decreased dorsiflexion - right Gait velocity: decreased   General Gait Details: Pt required manual facilitation to move RLE forward he was noted to buckle when turning to chair.  Pt with difficulty lifting L foot to step and turn.   Stairs             Wheelchair Mobility    Modified Rankin (Stroke Patients Only) Modified Rankin (Stroke Patients Only) Pre-Morbid Rankin Score: No symptoms Modified Rankin: Moderately severe disability     Balance Overall balance assessment: Needs assistance Sitting-balance support: Single extremity supported;Feet supported Sitting balance-Leahy Scale: Fair       Standing balance-Leahy Scale: Poor                              Cognition Arousal/Alertness: Awake/alert Behavior During Therapy: WFL for tasks assessed/performed Overall Cognitive Status: Impaired/Different from baseline Area of Impairment: Attention;Following commands;Safety/judgement;Awareness                   Current Attention Level: Selective   Following Commands: Follows one step commands inconsistently Safety/Judgement: Decreased awareness of safety;Decreased awareness of deficits     General Comments: Pt able to state a few words in session, however not as communicative as previous sessions      Exercises General Exercises - Lower Extremity Heel Slides: PROM;10 reps;Supine;AAROM;Right(active to move into flexion /passive to move  into extension) Other Exercises Other Exercises: R LE HS HC stretch 2x15 sec.    General Comments        Pertinent Vitals/Pain Pain Assessment: Faces Faces Pain Scale: Hurts little more Pain Location: R lower lateral shin with noted brusing but denies  pain he reports pain in upper thigh. Pain Descriptors / Indicators: Tender;Discomfort Pain Intervention(s): Monitored during session;Repositioned    Home Living                      Prior Function            PT Goals (current goals can now be found in the care plan section) Acute Rehab PT Goals Patient Stated Goal: none stated Potential to Achieve Goals: Fair Progress towards PT goals: Progressing toward goals    Frequency    Min 3X/week      PT Plan Current plan remains appropriate    Co-evaluation              AM-PAC PT "6 Clicks" Mobility   Outcome Measure  Help needed turning from your back to your side while in a flat bed without using bedrails?: A Lot Help needed moving from lying on your back to sitting on the side of a flat bed without using bedrails?: A Lot Help needed moving to and from a bed to a chair (including a wheelchair)?: Total Help needed standing up from a chair using your arms (e.g., wheelchair or bedside chair)?: A Lot Help needed to walk in hospital room?: Total Help needed climbing 3-5 steps with a railing? : Total 6 Click Score: 9    End of Session Equipment Utilized During Treatment: Gait belt Activity Tolerance: Patient tolerated treatment well Patient left: in chair;with call bell/phone within reach;with chair alarm set(sitter alarm belt donned.) Nurse Communication: Mobility status(bruise on shin) PT Visit Diagnosis: Other abnormalities of gait and mobility (R26.89);Hemiplegia and hemiparesis;Muscle weakness (generalized) (M62.81) Hemiplegia - Right/Left: Right Hemiplegia - dominant/non-dominant: Dominant Hemiplegia - caused by: Cerebral infarction     Time: 1610-9604 PT Time Calculation (min) (ACUTE ONLY): 27 min  Charges:  $Therapeutic Exercise: 8-22 mins $Therapeutic Activity: 8-22 mins                     Bonney Leitz , PTA Acute Rehabilitation Services Pager [phone] Office [phone]     Mahira Petras Artis Delay 06/16/2019, 5:31 PM

## 2019-06-16 NOTE — Progress Notes (Signed)
STROKE TEAM PROGRESS NOTE   INTERVAL HISTORY Sitting up comfortably in bed.No changes.  Vital signs stable.   CBC and BMP this morning are unremarkable OBJECTIVE Vitals:   06/15/19 2329 06/16/19 0422 06/16/19 0821 06/16/19 1133  BP: (!) 142/73 (!) 144/62 (!) 141/73 135/65  Pulse: 74 72 74 72  Resp: 16 18 18 18   Temp: 97.8 F (36.6 C) 99 F (37.2 C) 98.3 F (36.8 C) 98.4 F (36.9 C)  TempSrc: Oral Oral Oral Oral  SpO2:  95% 95% 96%  Weight:       CBC:  Recent Labs  Lab 06/12/19 0510 06/16/19 0415  WBC 6.9 6.2  HGB 11.6* 12.3*  HCT 33.8* 36.0*  MCV 93.1 92.3  PLT 257 852   Basic Metabolic Panel:  Recent Labs  Lab 06/12/19 0510 06/16/19 0415  NA 134* 133*  K 3.7 3.7  CL 100 98  CO2 25 23  GLUCOSE 103* 108*  BUN 19 21  CREATININE 1.20 1.19  CALCIUM 8.8* 8.9    IMAGING past 24h No results found.   PHYSICAL EXAM      He is awake alert and interactive.  His speech is nonfluent but can speak few words and short sentences.  Follows simple commands. Unable to lift RLE but otherwise ok with RUE, LUE, and LLE.   Babinski right. Grimaces to pain in RLE and minor withdrawal.  .  ASSESSMENT/PLAN Mr. Hillel Card is a 86 y.o. male with history of hypertension and  mild dementia (medical care at [facility]), presents to the emergency department with sudden onset of slurred speech, aphasia, left gaze preference with right-sided neglect, right hemiparesis, facial droop and weakness. He received IV t-PA Friday 04/16/19 at 2230.  Stroke: Left ACA territory infarct, likely due to large vessel disease although cardioembolic source not excluded  Consider inpt loop recorder placement on discharge   VTE prophylaxis - lovenox  No antithrombotic prior to admission, now on ASA 325 and plavix DAPT for 3 months and then ASA alone given significant intracranial stenosis  Palliative consult 04/23/19 - Full Code - plan OP referral to Hospice. Continue to follow pt as an IP, routine attempts  to contact family.  Therapy recommendations: SNF   Disposition:  Pending - pt without insurance coverage - SW continues to be involved. The pts son has petitioned guardianship with the courts, and there is a court date scheduled for 06/09/19.   Medically ready for d/c once d/c plan determined.   Hypertension  Off lisinopril due to elevated Cre  On norvasc 5 -> 10 mg  On labetalol 100mg  bid . 1 elevated BP 219/199 recheck 180/62 . Monitor  . BP goal normotensive  Hyperlipidemia  LDL 113, goal < 70  Current lipid lowering medication: on lipitor 40  Continue statin at discharge  Dysphagia . Secondary to stroke . D1 thin liquids->D2 thin liquids . Speech on board . Pt with good po intake eating 100% meals and snacks . Off IVF   AKI on CKD 3a   Cre 1.2  Have repetitively stopped IVF after staff education to encourage PO, but Cre always goes up  Off IVF  Monitor Cre  Other Stroke Risk Factors  Advanced age  Hx stroke/TIA - by imaging  Other Active Problems  Dementia - on galantamine and Risperdal   Hyponatremia - 134   Dressing removed to L groin 3/18; still red, but no exudate.   Court date today for guardianship.  Lawyer to notify SW of  outcome.  Hospital day # 26  Patient remains difficult to place in a skilled nursing facility due to lack of insurance.  He remains medically stable for transfer if bed available.  Continue ongoing management.  No changes.   [name], MD         To contact Stroke Continuity provider, please refer to [email or web address]. After hours, contact General Neurology

## 2019-06-17 ENCOUNTER — Encounter (HOSPITAL_COMMUNITY): Payer: Self-pay | Admitting: Neurology

## 2019-06-17 NOTE — Progress Notes (Signed)
STROKE TEAM PROGRESS NOTE   INTERVAL HISTORY Sitting up comfortably in bed.No changes.  Vital signs stable.     OBJECTIVE Vitals:   06/17/19 0038 06/17/19 0409 06/17/19 0842 06/17/19 1431  BP: (!) 135/59 (!) 154/65 (!) 150/59 (!) 147/66  Pulse: 73 85 73 78  Resp: 20 16 18 16   Temp: 98.2 F (36.8 C) 97.6 F (36.4 C) (!) 97.5 F (36.4 C) 98.2 F (36.8 C)  TempSrc: Oral Oral Oral Oral  SpO2: 95% 98% 95% 97%  Weight:       CBC:  Recent Labs  Lab 06/12/19 0510 06/16/19 0415  WBC 6.9 6.2  HGB 11.6* 12.3*  HCT 33.8* 36.0*  MCV 93.1 92.3  PLT 257 381   Basic Metabolic Panel:  Recent Labs  Lab 06/12/19 0510 06/16/19 0415  NA 134* 133*  K 3.7 3.7  CL 100 98  CO2 25 23  GLUCOSE 103* 108*  BUN 19 21  CREATININE 1.20 1.19  CALCIUM 8.8* 8.9    IMAGING past 24h No results found.   PHYSICAL EXAM      He is awake alert and interactive.  His speech is nonfluent but can speak few words and short sentences.  Follows simple commands. Unable to lift RLE but otherwise ok with RUE, LUE, and LLE.   Babinski right. Grimaces to pain in RLE and minor withdrawal.  .  ASSESSMENT/PLAN [name] is a 86 y.o. male with history of hypertension and  mild dementia (medical care at [facility]), presents to the emergency department with sudden onset of slurred speech, aphasia, left gaze preference with right-sided neglect, right hemiparesis, facial droop and weakness. He received IV t-PA Friday 04/16/19 at 2230.  Stroke: Left ACA territory infarct, likely due to large vessel disease although cardioembolic source not excluded  Consider inpt loop recorder placement on discharge   VTE prophylaxis - lovenox  No antithrombotic prior to admission, now on ASA 325 and plavix DAPT for 3 months and then ASA alone given significant intracranial stenosis  Palliative consult 04/23/19 - Full Code - plan OP referral to Hospice. Continue to follow pt as an IP, routine attempts to contact  family.  Therapy recommendations: SNF   Disposition:  Pending - pt without insurance coverage - SW continues to be involved. The pts son has petitioned guardianship with the courts, and there is a court date scheduled for 06/09/19.   Medically ready for d/c once d/c plan determined.   Hypertension  Off lisinopril due to elevated Cre  On norvasc 5 -> 10 mg  On labetalol 100mg  bid . 1 elevated BP 219/199 recheck 180/62 . Monitor  . BP goal normotensive  Hyperlipidemia  LDL 113, goal < 70  Current lipid lowering medication: on lipitor 40  Continue statin at discharge  Dysphagia . Secondary to stroke . D1 thin liquids->D2 thin liquids . Speech on board . Pt with good po intake eating 100% meals and snacks . Off IVF   AKI on CKD 3a   Cre 1.2  Have repetitively stopped IVF after staff education to encourage PO, but Cre always goes up  Off IVF  Monitor Cre  Other Stroke Risk Factors  Advanced age  Hx stroke/TIA - by imaging  Other Active Problems  Dementia - on galantamine and Risperdal   Hyponatremia - 134   Dressing removed to L groin 3/18; still red, but no exudate.   Court date today for guardianship.  Lawyer to notify SW of outcome.  Hospital  day # 56  Patient remains difficult to place in a skilled nursing facility due to lack of insurance.  He remains medically stable for transfer if bed available.  Continue ongoing management.  No changes.    [name], MD         To contact Stroke Continuity provider, please refer to [email or web address]. After hours, contact General Neurology

## 2019-06-18 NOTE — Progress Notes (Signed)
STROKE TEAM PROGRESS NOTE   INTERVAL HISTORY Sitting up comfortably in bedside chair and playing with TV remote.Marland KitchenNo changes.  Vital signs stable.     OBJECTIVE Vitals:   06/17/19 1431 06/17/19 1654 06/18/19 0901 06/18/19 1159  BP: (!) 147/66 (!) 151/61 (!) 158/68 (!) 163/59  Pulse: 78 80 74 77  Resp: 16 18 18 18   Temp: 98.2 F (36.8 C) 99.1 F (37.3 C) (!) 97.5 F (36.4 C) 98.2 F (36.8 C)  TempSrc: Oral Oral Oral Oral  SpO2: 97% 96% 100% 97%  Weight:       CBC:  Recent Labs  Lab 06/12/19 0510 06/16/19 0415  WBC 6.9 6.2  HGB 11.6* 12.3*  HCT 33.8* 36.0*  MCV 93.1 92.3  PLT 257 081   Basic Metabolic Panel:  Recent Labs  Lab 06/12/19 0510 06/16/19 0415  NA 134* 133*  K 3.7 3.7  CL 100 98  CO2 25 23  GLUCOSE 103* 108*  BUN 19 21  CREATININE 1.20 1.19  CALCIUM 8.8* 8.9    IMAGING past 24h No results found.   PHYSICAL EXAM      He is awake alert and interactive.  His speech is nonfluent but can speak few words and short sentences.  Follows simple commands. Unable to lift RLE but otherwise ok with RUE, LUE, and LLE.   Babinski right. Grimaces to pain in RLE and minor withdrawal.  .  ASSESSMENT/PLAN Mr. Demarie Hyneman is a 86 y.o. male with history of hypertension and  mild dementia (medical care at [facility]), presents to the emergency department with sudden onset of slurred speech, aphasia, left gaze preference with right-sided neglect, right hemiparesis, facial droop and weakness. He received IV t-PA Friday 04/16/19 at 2230.  Stroke: Left ACA territory infarct, likely due to large vessel disease although cardioembolic source not excluded  Consider inpt loop recorder placement on discharge   VTE prophylaxis - lovenox  No antithrombotic prior to admission, now on ASA 325 and plavix DAPT for 3 months and then ASA alone given significant intracranial stenosis  Palliative consult 04/23/19 - Full Code - plan OP referral to Hospice. Continue to follow pt as an IP,  routine attempts to contact family.  Therapy recommendations: SNF   Disposition:  Pending - pt without insurance coverage - SW continues to be involved. The pts son has petitioned guardianship with the courts, and there is a court date scheduled for 06/09/19.   Medically ready for d/c once d/c plan determined.   Hypertension  Off lisinopril due to elevated Cre  On norvasc 5 -> 10 mg  On labetalol 100mg  bid . 1 elevated BP 219/199 recheck 180/62 . Monitor  . BP goal normotensive  Hyperlipidemia  LDL 113, goal < 70  Current lipid lowering medication: on lipitor 40  Continue statin at discharge  Dysphagia . Secondary to stroke . D1 thin liquids->D2 thin liquids . Speech on board . Pt with good po intake eating 100% meals and snacks . Off IVF   AKI on CKD 3a   Cre 1.2  Have repetitively stopped IVF after staff education to encourage PO, but Cre always goes up  Off IVF  Monitor Cre  Other Stroke Risk Factors  Advanced age  Hx stroke/TIA - by imaging  Other Active Problems  Dementia - on galantamine and Risperdal   Hyponatremia - 134   Dressing removed to L groin 3/18; still red, but no exudate.   Court date today for guardianship.  Lawyer to  notify SW of outcome.  Hospital day # 79  Patient remains difficult to place in a skilled nursing facility due to lack of insurance.  He remains medically stable for transfer if bed available.  Continue ongoing management.        [name], MD         To contact Stroke Continuity provider, please refer to [email or web address]. After hours, contact General Neurology

## 2019-06-18 NOTE — Progress Notes (Signed)
Orthopedic Tech Progress Note Patient Details:  Heather Streeper Jan 16, 1930 [id number] Therapy called requesting a new KNEE IMMOBILIZER & PRAFO boot FOR PATIENT. Ortho Devices Type of Ortho Device: Knee Immobilizer, Prafo boot/shoe Ortho Device/Splint Location: RLE Ortho Device/Splint Interventions: Ordered, Application, Adjustment   Post Interventions Patient Tolerated: Well Instructions Provided: Care of device, Adjustment of device   [name] 06/18/2019, 11:52 AM

## 2019-06-18 NOTE — Plan of Care (Signed)
  Problem: Nutrition: Goal: Adequate nutrition will be maintained Outcome: Progressing   Problem: Safety: Goal: Ability to remain free from injury will improve Outcome: Progressing   

## 2019-06-18 NOTE — Progress Notes (Signed)
Physical Therapy Treatment Patient Details Name: Scott Gilbert MRN: [id number] DOB: 03-Mar-1930 Today's Date: 06/18/2019    History of Present Illness 86 y.o. male with past medical history significant for hypertension and mild dementia. He presented to the emergency department with sudden onset of slurred speech and aphasia, facial droop and weakness.   CT cerebral perfusion on 04/16/19 reported: "acute distal Left ACA territory ischemia and infarct."    PT Comments    Pt supine in bed on arrival this session.  Utilized L knee immobilizer but he continues to buckle in brace.  Asked neuro provider for order for R PRAFO with kick stand.  Called ortho tech for longer knee brace.  Pt continues to benefit from SNF placement.    Follow Up Recommendations  Supervision/Assistance - 24 hour;SNF     Equipment Recommendations  Wheelchair (measurements PT)    Recommendations for Other Services Rehab consult     Precautions / Restrictions Precautions Precautions: Fall Precaution Comments: short length KI Required Braces or Orthoses: Knee Immobilizer - Right;Other Brace Other Brace: R UE resting hand splint in room for use at night Restrictions Weight Bearing Restrictions: No    Mobility  Bed Mobility Overal bed mobility: Needs Assistance Bed Mobility: Supine to Sit;Sit to Supine Rolling: Mod assist Sidelying to sit: Mod assist       General bed mobility comments: Pt required assistance to move LEs to egde of bed and elevate trunk into a seated position.  Use of bed pad to scoot patient.  Transfers Overall transfer level: Needs assistance Equipment used: Rolling walker (2 wheeled) Transfers: Sit to/from Stand Sit to Stand: Mod assist;+2 safety/equipment;+2 physical assistance         General transfer comment: Pt required assistance to move into standing.  Cues for R hand placement and to push with L.  Buckling noted with in brace.  Ambulation/Gait Ambulation/Gait assistance:  Max assist;+2 physical assistance;+2 safety/equipment Gait Distance (Feet): 24 Feet Assistive device: Rolling walker (2 wheeled) Gait Pattern/deviations: Step-to pattern;Shuffle;Ataxic;Trunk flexed;Decreased dorsiflexion - right Gait velocity: decreased   General Gait Details: Pt required manual facilitation to move RLE forward he was noted to buckle in stance phase and required blocking to correct.  Close chair follow for safety.   Stairs             Wheelchair Mobility    Modified Rankin (Stroke Patients Only) Modified Rankin (Stroke Patients Only) Pre-Morbid Rankin Score: No symptoms Modified Rankin: Moderately severe disability     Balance Overall balance assessment: Needs assistance Sitting-balance support: Single extremity supported;Feet supported Sitting balance-Leahy Scale: Fair       Standing balance-Leahy Scale: Poor Standing balance comment: UE support and assist for balance                            Cognition Arousal/Alertness: Awake/alert Behavior During Therapy: WFL for tasks assessed/performed Overall Cognitive Status: Impaired/Different from baseline Area of Impairment: Attention;Following commands;Safety/judgement;Awareness                   Current Attention Level: Selective   Following Commands: Follows one step commands inconsistently Safety/Judgement: Decreased awareness of safety;Decreased awareness of deficits Awareness: Emergent          Exercises      General Comments        Pertinent Vitals/Pain Pain Assessment: Faces Faces Pain Scale: Hurts even more Pain Location: R LE with stretching into extension. Pain Descriptors / Indicators: Tender;Discomfort  Pain Intervention(s): Monitored during session;Repositioned    Home Living                      Prior Function            PT Goals (current goals can now be found in the care plan section) Acute Rehab PT Goals Patient Stated Goal: none  stated Potential to Achieve Goals: Fair Progress towards PT goals: Progressing toward goals    Frequency    Min 3X/week      PT Plan Current plan remains appropriate    Co-evaluation              AM-PAC PT "6 Clicks" Mobility   Outcome Measure  Help needed turning from your back to your side while in a flat bed without using bedrails?: A Lot Help needed moving from lying on your back to sitting on the side of a flat bed without using bedrails?: A Lot Help needed moving to and from a bed to a chair (including a wheelchair)?: Total Help needed standing up from a chair using your arms (e.g., wheelchair or bedside chair)?: Total Help needed to walk in hospital room?: Total Help needed climbing 3-5 steps with a railing? : Total 6 Click Score: 8    End of Session Equipment Utilized During Treatment: Gait belt Activity Tolerance: Patient tolerated treatment well Patient left: in chair;with call bell/phone within reach;with chair alarm set Nurse Communication: Mobility status(bruise) PT Visit Diagnosis: Other abnormalities of gait and mobility (R26.89);Hemiplegia and hemiparesis;Muscle weakness (generalized) (M62.81) Hemiplegia - Right/Left: Right Hemiplegia - dominant/non-dominant: Dominant Hemiplegia - caused by: Cerebral infarction     Time: 9476-5465 PT Time Calculation (min) (ACUTE ONLY): 30 min  Charges:  $Gait Training: 8-22 mins $Therapeutic Activity: 8-22 mins                     Erasmo Leventhal , PTA Acute Rehabilitation Services Pager [phone] Office [phone]     Imad Shostak [name] 06/18/2019, 11:15 AM

## 2019-06-19 NOTE — Plan of Care (Signed)
Plan of care reviewed with pt at bedside. VSS, denies pain. Condom cath in place. Right boot in place. Alarms activated. Call bell in reach. Pt stable at this time, will continue to monitor.  Problem: Education: Goal: Knowledge of disease or condition will improve Outcome: Progressing Goal: Knowledge of secondary prevention will improve Outcome: Progressing Goal: Knowledge of patient specific risk factors addressed and post discharge goals established will improve Outcome: Progressing Goal: Individualized Educational Video(s) Outcome: Progressing   Problem: Education: Goal: Knowledge of General Education information will improve Description: Including pain rating scale, medication(s)/side effects and non-pharmacologic comfort measures Outcome: Progressing   Problem: Health Behavior/Discharge Planning: Goal: Ability to manage health-related needs will improve Outcome: Progressing   Problem: Clinical Measurements: Goal: Ability to maintain clinical measurements within normal limits will improve Outcome: Progressing Goal: Will remain free from infection Outcome: Progressing Goal: Diagnostic test results will improve Outcome: Progressing Goal: Respiratory complications will improve Outcome: Progressing Goal: Cardiovascular complication will be avoided Outcome: Progressing   Problem: Activity: Goal: Risk for activity intolerance will decrease Outcome: Progressing   Problem: Nutrition: Goal: Adequate nutrition will be maintained Outcome: Progressing   Problem: Coping: Goal: Level of anxiety will decrease Outcome: Progressing   Problem: Elimination: Goal: Will not experience complications related to bowel motility Outcome: Progressing Goal: Will not experience complications related to urinary retention Outcome: Progressing   Problem: Pain Managment: Goal: General experience of comfort will improve Outcome: Progressing   Problem: Safety: Goal: Ability to remain free  from injury will improve Outcome: Progressing   Problem: Skin Integrity: Goal: Risk for impaired skin integrity will decrease Outcome: Progressing

## 2019-06-19 NOTE — Progress Notes (Signed)
STROKE TEAM PROGRESS NOTE   INTERVAL HISTORY Reclining in bed, comfortably, no complains, no event overnight. No changes.  Vital signs stable.  Labs pending. Still has RLE weakness, LUE continues to improve.   OBJECTIVE Vitals:   06/18/19 1558 06/18/19 1946 06/18/19 2300 06/19/19 0008  BP: 136/64 135/67 (!) 117/46 (!) 117/46  Pulse: 78 85 80 80  Resp: 18 18 18 18   Temp: 98.2 F (36.8 C) (!) 97.4 F (36.3 C) 98.6 F (37 C) 98.6 F (37 C)  TempSrc: Oral Oral Oral Oral  SpO2: 95% 95% 95% 95%  Weight:       CBC:  Recent Labs  Lab 06/16/19 0415  WBC 6.2  HGB 12.3*  HCT 36.0*  MCV 92.3  PLT 109   Basic Metabolic Panel:  Recent Labs  Lab 06/16/19 0415  NA 133*  K 3.7  CL 98  CO2 23  GLUCOSE 108*  BUN 21  CREATININE 1.19  CALCIUM 8.9    IMAGING past 24h No results found.   PHYSICAL EXAM       General - Well nourished, well developed elderly caucasian male, in no apparent distress.    Cardiovascular - Regular rhythm and rate.  Neuro - awake alert, making eye contact, tracking bilaterally. Able to follow simple commands. Moderate dysarthria with paraphasic errors, but orientated to place, self, age. But not to time. Can state his whole name. Able to name objects, repeat simple sentence but moderate dysarthric voice. Paucity of speech. Blinking to visual threat bilaterally. Right facial droop.  Tongue midline.  Left upper extremity 4/5, left lower extremity at least 3/5. Right upper extremity 3/5 with mildly increased muscle tone, and right lower extremity plegic with increased muscle tone. Right triple reflex positive. Global bradykinesia and rigidity. Sensation, coordination not cooperative and gait not tested.   ASSESSMENT/PLAN [name] is a 86 y.o. male with history of hypertension and  mild dementia (medical care at [facility]), presents to the emergency department with sudden onset of slurred speech, aphasia, left gaze preference with right-sided neglect,  right hemiparesis, facial droop and weakness. He received IV t-PA Friday 04/16/19 at 2230.  Stroke: Left ACA territory infarct, likely due to large vessel disease although cardioembolic source not excluded  Consider inpt loop recorder placement on discharge   VTE prophylaxis - lovenox  No antithrombotic prior to admission, now on ASA 325 and plavix DAPT for 3 months and then ASA alone given significant intracranial stenosis  Palliative consult 04/23/19 - Full Code - plan OP referral to Hospice. Continue to follow pt as an IP, routine attempts to contact family.  Therapy recommendations: SNF   Disposition:  Pending - pt without insurance coverage - SW continues to work on placement   Medically ready for d/c once d/c plan determined.   Hypertension  Off lisinopril due to elevated Cre  On norvasc 5 -> 10 mg  On labetalol 100mg  bid . 1 elevated BP 219/199 recheck 180/62 . Monitor  . BP goal normotensive  Hyperlipidemia  LDL 113, goal < 70  Current lipid lowering medication: on lipitor 40  Continue statin at discharge  Dysphagia . Secondary to stroke . D1 thin liquids->D2 thin liquids . Speech on board . Pt with good po intake eating 100% meals and snacks . Off IVF   AKI on CKD 3a   Cre 1.2  Have repetitively stopped IVF after staff education to encourage PO, but Cre always goes up  Off IVF  Monitor Cre  Other Stroke Risk Factors  Advanced age  Hx stroke/TIA - by imaging  Other Active Problems  Dementia - on galantamine and Risperdal   Hyponatremia - 134   Dressing removed to L groin 3/18; still red, but no exudate.   Hospital day # 27   Marvel Plan, MD PhD Stroke Neurology 06/19/2019 1:09 PM           To contact Stroke Continuity provider, please refer to [email or web address]. After hours, contact General Neurology

## 2019-06-20 LAB — BASIC METABOLIC PANEL
Anion gap: 11 (ref 5–15)
BUN: 30 mg/dL — ABNORMAL HIGH (ref 8–23)
CO2: 26 mmol/L (ref 22–32)
Calcium: 9.1 mg/dL (ref 8.9–10.3)
Chloride: 97 mmol/L — ABNORMAL LOW (ref 98–111)
Creatinine, Ser: 1.23 mg/dL (ref 0.61–1.24)
GFR calc Af Amer: >60 mL/min (ref >60)
GFR calc non Af Amer: 52 mL/min — ABNORMAL LOW (ref >60)
Glucose, Bld: 102 mg/dL — ABNORMAL HIGH (ref 70–99)
Potassium: 3.9 mmol/L (ref 3.5–5.1)
Sodium: 134 mmol/L — ABNORMAL LOW (ref 135–145)

## 2019-06-20 LAB — CBC
HCT: 38.7 % — ABNORMAL LOW (ref 39.0–52.0)
Hemoglobin: 13.1 g/dL (ref 13.0–17.0)
MCH: 31.6 pg (ref 26.0–34.0)
MCHC: 33.9 g/dL (ref 30.0–36.0)
MCV: 93.3 fL (ref 80.0–100.0)
Platelets: 296 10*3/uL (ref 150–400)
RBC: 4.15 MIL/uL — ABNORMAL LOW (ref 4.22–5.81)
RDW: 12.2 % (ref 11.5–15.5)
WBC: 9.3 10*3/uL (ref 4.0–10.5)
nRBC: 0 % (ref 0.0–0.2)

## 2019-06-20 MED ORDER — LABETALOL HCL 100 MG PO TABS
100.0000 mg | ORAL_TABLET | Freq: Three times a day (TID) | ORAL | Status: DC
  Administered 2019-06-20 – 2019-06-24 (×11): 100 mg via ORAL
  Filled 2019-06-20 (×11): qty 1

## 2019-06-20 NOTE — Plan of Care (Signed)
  Problem: Education: Goal: Knowledge of disease or condition will improve Outcome: Progressing Goal: Knowledge of secondary prevention will improve Outcome: Progressing Goal: Knowledge of patient specific risk factors addressed and post discharge goals established will improve Outcome: Progressing Goal: Individualized Educational Video(s) Outcome: Progressing   Problem: Education: Goal: Knowledge of General Education information will improve Description: Including pain rating scale, medication(s)/side effects and non-pharmacologic comfort measures Outcome: Progressing   Problem: Health Behavior/Discharge Planning: Goal: Ability to manage health-related needs will improve Outcome: Progressing   Problem: Clinical Measurements: Goal: Ability to maintain clinical measurements within normal limits will improve Outcome: Progressing Goal: Will remain free from infection Outcome: Progressing Goal: Diagnostic test results will improve Outcome: Progressing Goal: Respiratory complications will improve Outcome: Progressing Goal: Cardiovascular complication will be avoided Outcome: Progressing   Problem: Activity: Goal: Risk for activity intolerance will decrease Outcome: Progressing   Problem: Nutrition: Goal: Adequate nutrition will be maintained Outcome: Progressing   Problem: Coping: Goal: Level of anxiety will decrease Outcome: Progressing   Problem: Elimination: Goal: Will not experience complications related to bowel motility Outcome: Progressing Goal: Will not experience complications related to urinary retention Outcome: Progressing   Problem: Pain Managment: Goal: General experience of comfort will improve Outcome: Progressing   Problem: Safety: Goal: Ability to remain free from injury will improve Outcome: Progressing   Problem: Skin Integrity: Goal: Risk for impaired skin integrity will decrease Outcome: Progressing   

## 2019-06-20 NOTE — Progress Notes (Signed)
STROKE TEAM PROGRESS NOTE   INTERVAL HISTORY Sitting in bed, comfortably, no complains, no event overnight. Vital signs stable.  Labs stable.   OBJECTIVE Vitals:   06/19/19 2135 06/19/19 2323 06/20/19 0400 06/20/19 0755  BP: (!) 166/73 137/61 (!) 156/69 (!) 162/71  Pulse: 79 76 75 81  Resp:  16 16 16   Temp:  98.5 F (36.9 C) 98.3 F (36.8 C) 98.5 F (36.9 C)  TempSrc:  Oral Oral Oral  SpO2:  95% 96% 96%  Weight:       CBC:  Recent Labs  Lab 06/16/19 0415 06/20/19 0406  WBC 6.2 9.3  HGB 12.3* 13.1  HCT 36.0* 38.7*  MCV 92.3 93.3  PLT 272 296   Basic Metabolic Panel:  Recent Labs  Lab 06/16/19 0415 06/20/19 0406  NA 133* 134*  K 3.7 3.9  CL 98 97*  CO2 23 26  GLUCOSE 108* 102*  BUN 21 30*  CREATININE 1.19 1.23  CALCIUM 8.9 9.1    IMAGING past 24h No results found.   PHYSICAL EXAM       General - Well nourished, well developed elderly caucasian male, in no apparent distress.    Cardiovascular - Regular rhythm and rate.  Neuro - awake alert, making eye contact, tracking bilaterally. Able to follow simple commands. Moderate dysarthria with paraphasic errors, but orientated to place, self, age. But not to time. Can state his whole name. Able to name objects, repeat simple sentence but moderate dysarthric voice. Paucity of speech. Blinking to visual threat bilaterally. Right facial droop.  Tongue midline.  Left upper extremity 4/5, left lower extremity at least 3/5. Right upper extremity 3/5 with mildly increased muscle tone, and right lower extremity plegic with increased muscle tone. Right triple reflex positive. Global bradykinesia and rigidity. Sensation, coordination not cooperative and gait not tested.   ASSESSMENT/PLAN [name] is a 86 y.o. male with history of hypertension and  mild dementia (medical care at [facility]), presents to the emergency department with sudden onset of slurred speech, aphasia, left gaze preference with right-sided neglect,  right hemiparesis, facial droop and weakness. He received IV t-PA Friday 04/16/19 at 2230.  Stroke: Left ACA territory infarct, likely due to large vessel disease although cardioembolic source not excluded  Consider inpt loop recorder placement on discharge   VTE prophylaxis - lovenox  No antithrombotic prior to admission, now on ASA 325 and plavix DAPT for 3 months and then ASA alone given significant intracranial stenosis  Palliative consult 04/23/19 - Full Code - plan OP referral to Hospice. Continue to follow pt as an IP, routine attempts to contact family.  Therapy recommendations: SNF   Disposition:  Pending - pt without insurance coverage - SW continues to work on placement   Medically ready for d/c once d/c plan determined.   Hypertension  Off lisinopril due to elevated Cre  On norvasc 5 -> 10 mg  On labetalol 100mg  bid -> tid . BP on the higher end, increase labetalol to tid . BP goal normotensive  Hyperlipidemia  LDL 113, goal < 70  Current lipid lowering medication: on lipitor 40  Continue statin at discharge  Dysphagia . Secondary to stroke . D1 thin liquids->D2 thin liquids . Speech on board . Pt with good po intake eating 100% meals and snacks . Off IVF   AKI on CKD 3a   Cre 1.2->1.19->1.23  Have repetitively stopped IVF after staff education to encourage PO, but Cre always goes up  Off  IVF to prepare for SNF placement  Monitor Cre  Other Stroke Risk Factors  Advanced age  Hx stroke/TIA - by imaging  Other Active Problems  Dementia - on galantamine and Risperdal   Hyponatremia - 134   Dressing removed to L groin 3/18; still red, but no exudate.   Hospital day # 81  [name], MD PhD Stroke Neurology 06/20/2019 5:09 PM    To contact Stroke Continuity provider, please refer to [email or web address] After hours, contact General Neurology

## 2019-06-20 NOTE — Plan of Care (Signed)
Plan of care reviewed with pt and family at bedside. Pt stable at this time, will continue to monitor. Problem: Education: Goal: Knowledge of disease or condition will improve Outcome: Progressing Goal: Knowledge of secondary prevention will improve Outcome: Progressing Goal: Knowledge of patient specific risk factors addressed and post discharge goals established will improve Outcome: Progressing Goal: Individualized Educational Video(s) Outcome: Progressing   Problem: Education: Goal: Knowledge of General Education information will improve Description: Including pain rating scale, medication(s)/side effects and non-pharmacologic comfort measures Outcome: Progressing   Problem: Health Behavior/Discharge Planning: Goal: Ability to manage health-related needs will improve Outcome: Progressing   Problem: Clinical Measurements: Goal: Ability to maintain clinical measurements within normal limits will improve Outcome: Progressing Goal: Will remain free from infection Outcome: Progressing Goal: Diagnostic test results will improve Outcome: Progressing Goal: Respiratory complications will improve Outcome: Progressing Goal: Cardiovascular complication will be avoided Outcome: Progressing   Problem: Activity: Goal: Risk for activity intolerance will decrease Outcome: Progressing   Problem: Nutrition: Goal: Adequate nutrition will be maintained Outcome: Progressing   Problem: Coping: Goal: Level of anxiety will decrease Outcome: Progressing   Problem: Elimination: Goal: Will not experience complications related to bowel motility Outcome: Progressing Goal: Will not experience complications related to urinary retention Outcome: Progressing   Problem: Pain Managment: Goal: General experience of comfort will improve Outcome: Progressing   Problem: Safety: Goal: Ability to remain free from injury will improve Outcome: Progressing   Problem: Skin Integrity: Goal: Risk for  impaired skin integrity will decrease Outcome: Progressing

## 2019-06-21 NOTE — Plan of Care (Signed)
  Problem: Education: Goal: Knowledge of disease or condition will improve Outcome: Progressing Goal: Knowledge of secondary prevention will improve Outcome: Progressing Goal: Knowledge of patient specific risk factors addressed and post discharge goals established will improve Outcome: Progressing Goal: Individualized Educational Video(s) Outcome: Progressing   Problem: Education: Goal: Knowledge of General Education information will improve Description: Including pain rating scale, medication(s)/side effects and non-pharmacologic comfort measures Outcome: Progressing   Problem: Health Behavior/Discharge Planning: Goal: Ability to manage health-related needs will improve Outcome: Progressing   Problem: Clinical Measurements: Goal: Ability to maintain clinical measurements within normal limits will improve Outcome: Progressing Goal: Will remain free from infection Outcome: Progressing Goal: Diagnostic test results will improve Outcome: Progressing Goal: Respiratory complications will improve Outcome: Progressing Goal: Cardiovascular complication will be avoided Outcome: Progressing   Problem: Activity: Goal: Risk for activity intolerance will decrease Outcome: Progressing   Problem: Nutrition: Goal: Adequate nutrition will be maintained Outcome: Progressing   Problem: Coping: Goal: Level of anxiety will decrease Outcome: Progressing   Problem: Elimination: Goal: Will not experience complications related to bowel motility Outcome: Progressing Goal: Will not experience complications related to urinary retention Outcome: Progressing   Problem: Pain Managment: Goal: General experience of comfort will improve Outcome: Progressing   Problem: Safety: Goal: Ability to remain free from injury will improve Outcome: Progressing   Problem: Skin Integrity: Goal: Risk for impaired skin integrity will decrease Outcome: Progressing   

## 2019-06-21 NOTE — Plan of Care (Signed)
Plan of care reviewed with pt. Pt stable at this time, will continue to monitor. Call bell in reach, alarms activated.  Problem: Education: Goal: Knowledge of disease or condition will improve Outcome: Progressing Goal: Knowledge of secondary prevention will improve Outcome: Progressing Goal: Knowledge of patient specific risk factors addressed and post discharge goals established will improve Outcome: Progressing Goal: Individualized Educational Video(s) Outcome: Progressing   Problem: Education: Goal: Knowledge of General Education information will improve Description: Including pain rating scale, medication(s)/side effects and non-pharmacologic comfort measures Outcome: Progressing   Problem: Health Behavior/Discharge Planning: Goal: Ability to manage health-related needs will improve Outcome: Progressing   Problem: Clinical Measurements: Goal: Ability to maintain clinical measurements within normal limits will improve Outcome: Progressing Goal: Will remain free from infection Outcome: Progressing Goal: Diagnostic test results will improve Outcome: Progressing Goal: Respiratory complications will improve Outcome: Progressing Goal: Cardiovascular complication will be avoided Outcome: Progressing   Problem: Activity: Goal: Risk for activity intolerance will decrease Outcome: Progressing   Problem: Nutrition: Goal: Adequate nutrition will be maintained Outcome: Progressing   Problem: Coping: Goal: Level of anxiety will decrease Outcome: Progressing   Problem: Elimination: Goal: Will not experience complications related to bowel motility Outcome: Progressing Goal: Will not experience complications related to urinary retention Outcome: Progressing   Problem: Pain Managment: Goal: General experience of comfort will improve Outcome: Progressing   Problem: Safety: Goal: Ability to remain free from injury will improve Outcome: Progressing   Problem: Skin  Integrity: Goal: Risk for impaired skin integrity will decrease Outcome: Progressing

## 2019-06-21 NOTE — Hospital Course (Addendum)
Stroke: Left ACA territory infarct, likely due to large vessel disease although cardioembolic source not excluded Code Stroke CT Head - No acute intracranial abnormality, multiple remote lacunar infarcts involving the bilateral basal ganglia, with chronic left cerebellar infarcts.  MRI head - Moderate-sized acute ischemic nonhemorrhagic left ACA territory infarct CTA H&N - severe distal Left ICA siphon stenosis due to bulky calcified plaque, multifocal Left MCA irregularity and stenoses, up to severe stenoses of both distal VAs, and the Left PCA. bilateral MCA M3, and Left ACA A2 severe stenoses.  EEG - cortical dysfunction in left frontotemporal region, no seizures. 2D Echo - EF 70-75%. No intracardiac source of embolism  LE venous doppler  Negative for DVT  Consider 30 day cardiac event monitoring as outpt vs inpt loop placement on discharge (more cost effective) LDL - 113 HgbA1c - 5.3 UDS - negative VTE prophylaxis - lovenox No antithrombotic prior to admission, now on ASA 325 and plavix DAPT for 3 months and then ASA alone given significant intracranial stenosis Palliative consult 04/23/19 - Full Code - plan OP referral to Hospice. Continue to follow pt as an IP, routine attempts to contact family. They signed off IP. Plan OP referral to Hospice at d/c Therapy recommendations: SNF  Disposition:  ***   Hypertension Home BP meds: Zestril 2.5 Off lisinopril due to elevated Cre On norvasc 5 -> 10 mg On labetalol 100mg  bid ->200 bid->200 tid BP goal normotensive   Hyperlipidemia Home Lipid lowering medication: none LDL 113, goal < 70 Current lipid lowering medication: on lipitor 40 Continue statin at discharge   Dysphagia Secondary to stroke D1 thin liquids->D2 thin liquids Speech on board Pt with good po intake eating 100% meals and snacks   AKI on CKD 3a  Cre 1.2->1.19->1.23 Have repetitively stopped IVF after staff education to encourage PO, but Cre always goes up Off IVF to  prepare for SNF placement Monitor Cre   Mild tachycardia and tachypnea, resolved Afebrile  WBC normal UA negative 2/10, UA repeat unremarkable CXR NAD 2/10, repeat 04/30/19 - neg.   Other Stroke Risk Factors Advanced age Hx stroke/TIA - by imaging   Other Active Problems Dementia - on galantamine and Risperdal  Fall - right knee bruise, resolved Attempt trial of sinemet 2/13>>2/18 for possible vascular parkinsonism without improvement, stopped Hyperkalemia 4.5 resolved Hyponatremia - 133->134 L groin blister vs skin tear. Dressing removed to L groin 3/18; still red, but no exudate.

## 2019-06-21 NOTE — Progress Notes (Signed)
Physical Therapy Treatment Patient Details Name: Scott Gilbert MRN: [id number] DOB: [birth year] Today's Date: 06/21/2019    History of Present Illness 86 y.o. male with past medical history significant for hypertension and mild dementia. He presented to the emergency department with sudden onset of slurred speech and aphasia, facial droop and weakness.   CT cerebral perfusion on 04/16/19 reported: "acute distal Left ACA territory ischemia and infarct."    PT Comments    Patient seen for mobility progression. Pt continues to require +2 assist for functional transfer training. R knee immobilizer utilized due to R LE spacticity. Continue to progress as tolerated with anticipated d/c to SNF for further skilled PT services.    Follow Up Recommendations  Supervision/Assistance - 24 hour;SNF     Equipment Recommendations  Wheelchair (measurements PT)    Recommendations for Other Services Rehab consult     Precautions / Restrictions Precautions Precautions: Fall Required Braces or Orthoses: Knee Immobilizer - Right Knee Immobilizer - Right: (for therapy use) Other Brace: R UE resting hand splint in room for use at night    Mobility  Bed Mobility Overal bed mobility: Needs Assistance Bed Mobility: Supine to Sit     Supine to sit: Mod assist     General bed mobility comments: assist to bring bilat LE/hips to EOB and to elevate trunk into sitting; multimodal cues for sequencing   Transfers Overall transfer level: Needs assistance Equipment used: Rolling walker (2 wheeled) Transfers: Sit to/from Stand Sit to Stand: Mod assist;+2 safety/equipment;+2 physical assistance;Max assist Stand pivot transfers: Mod assist;+2 physical assistance       General transfer comment: max A +2 initial stand from EOB and mod A +2 for rest of transfers; assist to power up into standing and for balance/weight shifting, and managing AD to pivot  Ambulation/Gait                 Stairs              Wheelchair Mobility    Modified Rankin (Stroke Patients Only) Modified Rankin (Stroke Patients Only) Pre-Morbid Rankin Score: No symptoms Modified Rankin: Moderately severe disability     Balance Overall balance assessment: Needs assistance Sitting-balance support: Single extremity supported;Feet supported Sitting balance-Leahy Scale: Fair   Postural control: Right lateral lean Standing balance support: Bilateral upper extremity supported;During functional activity Standing balance-Leahy Scale: Poor                              Cognition Arousal/Alertness: Awake/alert Behavior During Therapy: WFL for tasks assessed/performed Overall Cognitive Status: Impaired/Different from baseline Area of Impairment: Attention;Following commands;Safety/judgement;Awareness                   Current Attention Level: Selective   Following Commands: Follows one step commands inconsistently Safety/Judgement: Decreased awareness of safety;Decreased awareness of deficits Awareness: Emergent          Exercises Other Exercises Other Exercises: R LE stretching prior to mobilizing     General Comments        Pertinent Vitals/Pain Pain Assessment: Faces Faces Pain Scale: Hurts little more Pain Location: R LE with stretching into extension. Pain Descriptors / Indicators: Guarding;Sore;Tightness;Grimacing Pain Intervention(s): Limited activity within patient's tolerance;Monitored during session;Repositioned    Home Living                      Prior Function  PT Goals (current goals can now be found in the care plan section) Progress towards PT goals: Progressing toward goals    Frequency    Min 3X/week      PT Plan Current plan remains appropriate    Co-evaluation              AM-PAC PT "6 Clicks" Mobility   Outcome Measure  Help needed turning from your back to your side while in a flat bed without using  bedrails?: A Lot Help needed moving from lying on your back to sitting on the side of a flat bed without using bedrails?: A Lot Help needed moving to and from a bed to a chair (including a wheelchair)?: A Lot Help needed standing up from a chair using your arms (e.g., wheelchair or bedside chair)?: A Lot Help needed to walk in hospital room?: A Lot Help needed climbing 3-5 steps with a railing? : Total 6 Click Score: 11    End of Session Equipment Utilized During Treatment: Gait belt Activity Tolerance: Patient tolerated treatment well Patient left: in chair;with call bell/phone within reach;with chair alarm set Nurse Communication: Mobility status PT Visit Diagnosis: Other abnormalities of gait and mobility (R26.89);Hemiplegia and hemiparesis;Muscle weakness (generalized) (M62.81) Hemiplegia - Right/Left: Right Hemiplegia - dominant/non-dominant: Dominant Hemiplegia - caused by: Cerebral infarction     Time: 5537-4827 PT Time Calculation (min) (ACUTE ONLY): 22 min  Charges:  $Gait Training: 8-22 mins                     Erline Levine, PTA Acute Rehabilitation Services Pager: [phone] Office: [phone]     Carolynne Edouard 06/21/2019, 2:00 PM

## 2019-06-21 NOTE — Progress Notes (Signed)
STROKE TEAM PROGRESS NOTE   INTERVAL HISTORY Patient up in chair "for a while". Lunch soon. Scott Gilbert he had a good Easter. No change.   OBJECTIVE Vitals:   06/21/19 0834 06/21/19 0843 06/21/19 0926 06/21/19 1124  BP: 138/68 138/68  140/61  Pulse: 74 74  71  Resp: 18 18 18 18   Temp: 98.2 F (36.8 C) 98.2 F (36.8 C)  98.1 F (36.7 C)  TempSrc: Axillary Oral  Oral  SpO2: 97% 97%  94%  Weight:       CBC:  Recent Labs  Lab 06/16/19 0415 06/20/19 0406  WBC 6.2 9.3  HGB 12.3* 13.1  HCT 36.0* 38.7*  MCV 92.3 93.3  PLT 272 296   Basic Metabolic Panel:  Recent Labs  Lab 06/16/19 0415 06/20/19 0406  NA 133* 134*  K 3.7 3.9  CL 98 97*  CO2 23 26  GLUCOSE 108* 102*  BUN 21 30*  CREATININE 1.19 1.23  CALCIUM 8.9 9.1    IMAGING past 24h No results found.   PHYSICAL EXAM    General - Well nourished, well developed elderly caucasian male, in no apparent distress.   Cardiovascular - Regular rhythm and rate. Neuro - awake alert, making eye contact, tracking bilaterally. Able to follow simple commands. Moderate dysarthria with paraphasic errors, but orientated to place, self, age. But not to time. Can state his whole name. Able to name objects, repeat simple sentence but moderate dysarthric voice. Paucity of speech. Blinking to visual threat bilaterally. Right facial droop.  Tongue midline.  Left upper extremity 4/5, left lower extremity at least 3/5. Right upper extremity 3/5 with mildly increased muscle tone, and right lower extremity plegic with increased muscle tone. Right triple reflex positive. Global bradykinesia and rigidity. Sensation, coordination not cooperative and gait not tested.   ASSESSMENT/PLAN [name] is a 86 y.o. male with history of hypertension and  mild dementia (medical care at [facility]), presents to the emergency department with sudden onset of slurred speech, aphasia, left gaze preference with right-sided neglect, right hemiparesis, facial droop and  weakness. He received IV t-PA Friday 04/16/19 at 2230.  Stroke: Left ACA territory infarct, likely due to large vessel disease although cardioembolic source not excluded  Consider inpt loop recorder placement on discharge   VTE prophylaxis - lovenox  on ASA 325 and plavix DAPT for 3 months and then ASA alone given significant intracranial stenosis  Therapy recommendations: SNF   Disposition:  Pending - pt without insurance coverage - SW continues to work on placement   Medically ready for d/c once d/c plan determined.   Hypertension  On norvasc 10 mg  On labetalol 100mg  tid . BP goal normotensive  Hyperlipidemia  LDL 113  on lipitor 40  Dysphagia . On D2 thin liquids . Off IVF   AKI on CKD 3a   1.23  Off IVF   Monitor Cre  Other Active Problems  Dementia - on galantamine and Risperdal   Hyponatremia - 134   Hospital day # 31  , MD PhD Stroke Neurology 06/21/2019 3:09 PM    To contact Stroke Continuity provider, please refer to Marvel Plan. After hours, contact General Neurology

## 2019-06-22 NOTE — Progress Notes (Signed)
STROKE TEAM PROGRESS NOTE   INTERVAL HISTORY Lying in the bed, just had a BM. Face red. No new complaints.   OBJECTIVE Vitals:   06/21/19 2000 06/22/19 0000 06/22/19 0400 06/22/19 0732  BP: (!) 143/67 (!) 147/64 (!) 152/66 (!) 155/68  Pulse: 80 73 70 69  Resp: 18 16 16 18   Temp: 98.8 F (37.1 C) 98.2 F (36.8 C) 97.7 F (36.5 C) 98.1 F (36.7 C)  TempSrc: Oral Axillary Oral Oral  SpO2: 94% 97% 97% 94%  Weight:       CBC:  Recent Labs  Lab 06/16/19 0415 06/20/19 0406  WBC 6.2 9.3  HGB 12.3* 13.1  HCT 36.0* 38.7*  MCV 92.3 93.3  PLT 272 296   Basic Metabolic Panel:  Recent Labs  Lab 06/16/19 0415 06/20/19 0406  NA 133* 134*  K 3.7 3.9  CL 98 97*  CO2 23 26  GLUCOSE 108* 102*  BUN 21 30*  CREATININE 1.19 1.23  CALCIUM 8.9 9.1    IMAGING past 24h No results found.   PHYSICAL EXAM  - unchanged General - Well nourished, well developed elderly caucasian male, in no apparent distress.   Cardiovascular - Regular rhythm and rate. Neuro - awake alert, making eye contact, tracking bilaterally. Able to follow simple commands. Moderate dysarthria with paraphasic errors, but orientated to place, self, age. But not to time. Can state his whole name. Able to name objects, repeat simple sentence but moderate dysarthric voice. Paucity of speech. Blinking to visual threat bilaterally. Right facial droop.  Tongue midline.  Left upper extremity 4/5, left lower extremity at least 3/5. Right upper extremity 3/5 with mildly increased muscle tone, and right lower extremity plegic with increased muscle tone. Right triple reflex positive. Global bradykinesia and rigidity. Sensation, coordination not cooperative and gait not tested.   ASSESSMENT/PLAN Scott Gilbert is a 86 y.o. male with history of hypertension and  mild dementia (medical care at [facility]), presents to the emergency department with sudden onset of slurred speech, aphasia, left gaze preference with right-sided neglect,  right hemiparesis, facial droop and weakness. He received IV t-PA Friday 04/16/19 at 2230.  Stroke: Left ACA territory infarct, likely due to large vessel disease although cardioembolic source not excluded  VTE prophylaxis - lovenox  on ASA 325 and plavix DAPT for 3 months and then ASA alone given significant intracranial stenosis  Therapy recommendations: SNF   Consider inpt loop recorder placement on discharge   Disposition:  Pending - pt without insurance coverage - SW continues to work on placement   Medically ready for d/c once d/c plan determined.   Hypertension  On norvasc 10 mg  On labetalol 100mg  tid . BP 140-150s . goal normotensive  Hyperlipidemia  LDL 113  on lipitor 40  Dysphagia . On D2 thin liquids w/ good po intake  AKI on CKD 3a   1.23  Off IVF   Monitor Cre  Other Active Problems  Dementia - on galantamine and Risperdal   Hyponatremia - 134   Hospital day # 36  , MD PhD Stroke Neurology 06/22/2019 12:03 PM    To contact Stroke Continuity provider, please refer to Marvel Plan. After hours, contact General Neurology

## 2019-06-22 NOTE — Progress Notes (Signed)
Received Interim Guardianship paperwork from pt.'s daughter appointing her guardian: Enis Slipper [phone] [facility]); [phone] (cell); added to contact list.

## 2019-06-22 NOTE — Progress Notes (Signed)
Occupational Therapy Treatment Patient Details Name: Scott Gilbert MRN: [id number] DOB: [birth year] Today's Date: 06/22/2019    History of present illness 86 y.o. male with past medical history significant for hypertension and mild dementia. He presented to the emergency department with sudden onset of slurred speech and aphasia, facial droop and weakness.   CT cerebral perfusion on 04/16/19 reported: "acute distal Left ACA territory ischemia and infarct."   OT comments  Pt engaged in bed level mobility with R and L UE this session demonstrating more controlled movement of R UE then previous session. Pt grasping objects with R hand and appropriately releasing them on command. Pt demonstrates decrease awareness to incontinence. Pt managed to have bil mitten in the bed with his this session and uncertain if he removed them or if not don prior to arrival.   Follow Up Recommendations  Supervision/Assistance - 24 hour;SNF    Equipment Recommendations  Wheelchair (measurements OT);Wheelchair cushion (measurements OT);3 in 1 bedside commode    Recommendations for Other Services      Precautions / Restrictions Precautions Precautions: Fall Required Braces or Orthoses: Knee Immobilizer - Right Other Brace: R UE resting hand splint in room for use at night       Mobility Bed Mobility Overal bed mobility: Needs Assistance Bed Mobility: Rolling Rolling: Max assist         General bed mobility comments: pt log roll min to the Right iwth L UE and L LE engaged. pt requires max (A) to sustain L side lying due to decrease coordination of R UE / LE. pt unable to sustain bed rail grasp but does follow command.   Transfers                 General transfer comment: session focused on bed level movement and R UE engagment . bed placed in chair position with restraints put in place    Balance                                           ADL either performed or assessed  with clinical judgement   ADL Overall ADL's : Needs assistance/impaired                             Toileting- Clothing Manipulation and Hygiene: Total assistance         General ADL Comments: on arrival noted to have d/c condom cath and urine and stool in bed and unabl to request (A). pt given commands to roll R to weight bear on affect side. pt sustains holding bed rail with request of therapist for peri care. Barrier cream applied due to increased risk for skin break down. pt requires increase (A) to roll and sustain L side      Vision       Perception     Praxis      Cognition Arousal/Alertness: Awake/alert Behavior During Therapy: WFL for tasks assessed/performed Overall Cognitive Status: History of cognitive impairments - at baseline                                 General Comments: following commands with increased time and giggles at all request        Exercises General Exercises - Upper Extremity Shoulder Horizontal  ABduction: AAROM;Right;20 reps;Supine Shoulder Horizontal ADduction: AAROM;20 reps;Supine;Right Shoulder Exercises Shoulder Extension: AAROM;Right;20 reps;Supine Other Exercises Other Exercises: pt is able to grasp a tape roll and then pass it to L UE then to therapist. pt is able to grasp a bottle pass it R to L ue to therapist. pt tracking and locating object in all visual field with R UE sustaining against gravity    Shoulder Instructions       General Comments on arrival mitten doff, condom foley d/c and pt positioned low in the bed with incontinence noted.     Pertinent Vitals/ Pain       Pain Assessment: No/denies pain  Home Living                                          Prior Functioning/Environment              Frequency  Min 2X/week        Progress Toward Goals  OT Goals(current goals can now be found in the care plan section)  Progress towards OT goals: Progressing toward  goals  Acute Rehab OT Goals Patient Stated Goal: none stated OT Goal Formulation: Patient unable to participate in goal setting Time For Goal Achievement: 07/06/19 Potential to Achieve Goals: Fair ADL Goals Pt Will Perform Eating: with min guard assist;sitting Pt Will Perform Grooming: with min guard assist;sitting Pt Will Perform Upper Body Dressing: with min assist;sitting Pt Will Transfer to Toilet: with min assist;ambulating;bedside commode Pt Will Perform Toileting - Clothing Manipulation and hygiene: with min assist;sitting/lateral leans;sit to/from stand Pt/caregiver will Perform Home Exercise Program: Increased strength;Right Upper extremity;Increased ROM;With minimal assist Additional ADL Goal #1: Pt will participate in x3 mins of ADL task with moderate cueing to attend.  Plan Discharge plan remains appropriate    Co-evaluation                 AM-PAC OT "6 Clicks" Daily Activity     Outcome Measure   Help from another person eating meals?: A Lot Help from another person taking care of personal grooming?: A Little Help from another person toileting, which includes using toliet, bedpan, or urinal?: A Lot Help from another person bathing (including washing, rinsing, drying)?: A Lot Help from another person to put on and taking off regular upper body clothing?: A Lot Help from another person to put on and taking off regular lower body clothing?: A Lot 6 Click Score: 13    End of Session    OT Visit Diagnosis: Unsteadiness on feet (R26.81);Feeding difficulties (R63.3)   Activity Tolerance Patient tolerated treatment well   Patient Left in bed;with call bell/phone within reach   Nurse Communication Mobility status;Precautions;Weight bearing status        Time: 5188-4166 OT Time Calculation (min): 13 min  Charges: OT General Charges $OT Visit: 1 Visit OT Treatments $Self Care/Home Management : 8-22 mins   Brynn, OTR/L  Acute Rehabilitation  Services Pager: [phone] Office: [phone] .    Mateo Flow 06/22/2019, 4:19 PM

## 2019-06-23 LAB — BASIC METABOLIC PANEL
Anion gap: 11 (ref 5–15)
BUN: 32 mg/dL — ABNORMAL HIGH (ref 8–23)
CO2: 26 mmol/L (ref 22–32)
Calcium: 8.8 mg/dL — ABNORMAL LOW (ref 8.9–10.3)
Chloride: 95 mmol/L — ABNORMAL LOW (ref 98–111)
Creatinine, Ser: 1.32 mg/dL — ABNORMAL HIGH (ref 0.61–1.24)
GFR calc Af Amer: 56 mL/min — ABNORMAL LOW (ref >60)
GFR calc non Af Amer: 48 mL/min — ABNORMAL LOW (ref >60)
Glucose, Bld: 133 mg/dL — ABNORMAL HIGH (ref 70–99)
Potassium: 3.9 mmol/L (ref 3.5–5.1)
Sodium: 132 mmol/L — ABNORMAL LOW (ref 135–145)

## 2019-06-23 LAB — CBC
HCT: 37.2 % — ABNORMAL LOW (ref 39.0–52.0)
Hemoglobin: 13 g/dL (ref 13.0–17.0)
MCH: 32 pg (ref 26.0–34.0)
MCHC: 34.9 g/dL (ref 30.0–36.0)
MCV: 91.6 fL (ref 80.0–100.0)
Platelets: 292 10*3/uL (ref 150–400)
RBC: 4.06 MIL/uL — ABNORMAL LOW (ref 4.22–5.81)
RDW: 12.2 % (ref 11.5–15.5)
WBC: 6.7 10*3/uL (ref 4.0–10.5)
nRBC: 0 % (ref 0.0–0.2)

## 2019-06-23 NOTE — Progress Notes (Signed)
Physical Therapy Treatment Patient Details Name: Scott Gilbert MRN: [id number] DOB: 1929/12/23 Today's Date: 06/23/2019    History of Present Illness 86 y.o. male with past medical history significant for hypertension and mild dementia. He presented to the emergency department with sudden onset of slurred speech and aphasia, facial droop and weakness.   CT cerebral perfusion on 04/16/19 reported: "acute distal Left ACA territory ischemia and infarct."    PT Comments    Patient seen for mobility progression. Pt tolerated functional transfer/gait training well. Continues to require +2 assist for OOB mobility. Continue to progress as tolerated with anticipated d/c to SNF for further skilled PT services.     Follow Up Recommendations  Supervision/Assistance - 24 hour;SNF     Equipment Recommendations  Wheelchair (measurements PT)    Recommendations for Other Services       Precautions / Restrictions Precautions Precautions: Fall Precaution Comments: short length KI Required Braces or Orthoses: Knee Immobilizer - Right Knee Immobilizer - Right: (for therapy use) Other Brace: R UE resting hand splint in room for use at night Restrictions Weight Bearing Restrictions: No    Mobility  Bed Mobility Overal bed mobility: Needs Assistance Bed Mobility: Supine to Sit     Supine to sit: Mod assist     General bed mobility comments: cues for sequencing; assist to bring R LE and hip to EOB and then to elevate trunk into sitting; use of rail  Transfers Overall transfer level: Needs assistance Equipment used: Rolling walker (2 wheeled) Transfers: Sit to/from Stand Sit to Stand: Mod assist;+2 safety/equipment;+2 physical assistance;Max assist         General transfer comment: cues for safe hand placement; assist to power up into standing; max A required for balance upon standing and assistance to widen BOS as R LE adducted; multimodal cues and assist to right to midline    Ambulation/Gait Ambulation/Gait assistance: Max assist;+2 physical assistance;+2 safety/equipment Gait Distance (Feet): 4 Feet Assistive device: Rolling walker (2 wheeled) Gait Pattern/deviations: Step-to pattern;Trunk flexed;Decreased dorsiflexion - right;Decreased step length - right;Decreased step length - left(heavy R lateral bias) Gait velocity: decreased   General Gait Details: Pt required manual facilitation to advance R LE and assist to maintain balance/weight to L side and manage RW    Stairs             Wheelchair Mobility    Modified Rankin (Stroke Patients Only) Modified Rankin (Stroke Patients Only) Pre-Morbid Rankin Score: No symptoms Modified Rankin: Moderately severe disability     Balance Overall balance assessment: Needs assistance Sitting-balance support: Single extremity supported;Feet supported Sitting balance-Leahy Scale: Fair     Standing balance support: Bilateral upper extremity supported;During functional activity Standing balance-Leahy Scale: Zero                              Cognition Arousal/Alertness: Awake/alert Behavior During Therapy: WFL for tasks assessed/performed Overall Cognitive Status: History of cognitive impairments - at baseline Area of Impairment: Following commands;Safety/judgement;Problem solving                       Following Commands: Follows one step commands with increased time;Follows one step commands consistently Safety/Judgement: Decreased awareness of safety;Decreased awareness of deficits   Problem Solving: Requires verbal cues;Requires tactile cues General Comments: following commands with increased time and giggles at all request      Exercises      General Comments  Pertinent Vitals/Pain Pain Assessment: No/denies pain    Home Living                      Prior Function            PT Goals (current goals can now be found in the care plan section)  Progress towards PT goals: Progressing toward goals    Frequency    Min 3X/week      PT Plan Current plan remains appropriate    Co-evaluation              AM-PAC PT "6 Clicks" Mobility   Outcome Measure  Help needed turning from your back to your side while in a flat bed without using bedrails?: A Lot Help needed moving from lying on your back to sitting on the side of a flat bed without using bedrails?: A Lot Help needed moving to and from a bed to a chair (including a wheelchair)?: A Lot Help needed standing up from a chair using your arms (e.g., wheelchair or bedside chair)?: A Lot Help needed to walk in hospital room?: A Lot Help needed climbing 3-5 steps with a railing? : Total 6 Click Score: 11    End of Session Equipment Utilized During Treatment: Gait belt Activity Tolerance: Patient tolerated treatment well Patient left: in chair;with call bell/phone within reach;with chair alarm set Nurse Communication: Mobility status PT Visit Diagnosis: Other abnormalities of gait and mobility (R26.89);Hemiplegia and hemiparesis;Muscle weakness (generalized) (M62.81) Hemiplegia - Right/Left: Right Hemiplegia - dominant/non-dominant: Dominant Hemiplegia - caused by: Cerebral infarction     Time: 1010-1033 PT Time Calculation (min) (ACUTE ONLY): 23 min  Charges:  $Gait Training: 23-37 mins                     Erline Levine, PTA Acute Rehabilitation Services Pager: [phone] Office: [phone]     Carolynne Edouard 06/23/2019, 1:09 PM

## 2019-06-23 NOTE — Progress Notes (Signed)
STROKE TEAM PROGRESS NOTE   INTERVAL HISTORY Guardianship papers received yesterday. Patient up in chair - no new complaints. Readily response to questions and faster at following commands.    OBJECTIVE Vitals:   06/22/19 2323 06/23/19 0502 06/23/19 0832 06/23/19 1119  BP: (!) 143/75 130/61 128/71 (!) 171/74  Pulse: 82 73 76 76  Resp: 20 20 18 18   Temp: 98.4 F (36.9 C) 98.5 F (36.9 C) 98.4 F (36.9 C) 98.7 F (37.1 C)  TempSrc:    Oral  SpO2: 93% 100%  98%  Weight:       CBC:  Recent Labs  Lab 06/20/19 0406  WBC 9.3  HGB 13.1  HCT 38.7*  MCV 93.3  PLT 296   Basic Metabolic Panel:  Recent Labs  Lab 06/20/19 0406  NA 134*  K 3.9  CL 97*  CO2 26  GLUCOSE 102*  BUN 30*  CREATININE 1.23  CALCIUM 9.1    IMAGING past 24h No results found.   PHYSICAL EXAM  - same  General - Well nourished, well developed elderly caucasian male, in no apparent distress.   Cardiovascular - Regular rhythm and rate. Neuro - awake alert, making eye contact, tracking bilaterally. Able to follow simple commands. Moderate dysarthria with paraphasic errors, but orientated to place, self, age. But not to time. Can state his whole name. Able to name objects, repeat simple sentence but moderate dysarthric voice. Paucity of speech. Blinking to visual threat bilaterally. Right facial droop.  Tongue midline.  Left upper extremity 4/5, left lower extremity at least 3/5. Right upper extremity 3/5 with mildly increased muscle tone, and right lower extremity plegic with increased muscle tone. Right triple reflex positive. Global bradykinesia and rigidity. Sensation, coordination not cooperative and gait not tested.   ASSESSMENT/PLAN [name] is a 86 y.o. male with history of hypertension and  mild dementia (medical care at [facility]), presents to the emergency department with sudden onset of slurred speech, aphasia, left gaze preference with right-sided neglect, right hemiparesis, facial droop and  weakness. He received IV t-PA Friday 04/16/19 at 2230.  Stroke: Left ACA territory infarct, likely due to large vessel disease although cardioembolic source not excluded  VTE prophylaxis - lovenox  on ASA 325 and plavix DAPT for 3 months and then ASA alone given significant intracranial stenosis  Therapy recommendations: SNF   Consider inpt loop recorder placement on discharge   Disposition:  Pending - pt without insurance coverage - court appointed guardianship papers received - SW continues to work on placement   Medically ready for d/c once d/c plan determined.   Hypertension  On norvasc 10 mg  On labetalol 100mg  tid . BP 140-150s . goal normotensive  Hyperlipidemia  LDL 113  on lipitor 40  Dysphagia . On D2 thin liquids w/ good po intake  AKI on CKD 3a   1.23->pending   Monitor Cre  Other Active Problems  Dementia - on galantamine and Risperdal   Hyponatremia - 134-> pending   Hospital day # 56  , MSN, APRN, ANVP-BC, AGPCNP-BC Advanced Practice Stroke Nurse [facility] See Amion for Schedule & Pager information 06/23/2019 3:53 PM    To contact Stroke Continuity provider, please refer to [facility]. After hours, contact General Neurology

## 2019-06-24 MED ORDER — LABETALOL HCL 200 MG PO TABS
200.0000 mg | ORAL_TABLET | Freq: Two times a day (BID) | ORAL | Status: DC
  Administered 2019-06-24 – 2019-06-25 (×2): 200 mg via ORAL
  Filled 2019-06-24 (×2): qty 1

## 2019-06-24 NOTE — Progress Notes (Addendum)
STROKE TEAM PROGRESS NOTE   INTERVAL HISTORY Lying in the bed. Ate 100% of his lunch. No complaints. Improving language skills (naming, following commands, spontaneous speech).  OBJECTIVE Vitals:   06/23/19 1948 06/23/19 2347 06/24/19 0328 06/24/19 0807  BP: (!) 148/66 127/73 131/79 (!) 165/83  Pulse: 79 77 79 87  Resp: 18 18 20 20   Temp: 98 F (36.7 C) 98.3 F (36.8 C) 98 F (36.7 C) 98.4 F (36.9 C)  TempSrc: Oral Oral Oral Axillary  SpO2: 99% 99% 98% 96%  Weight:       CBC:  Recent Labs  Lab 06/20/19 0406 06/23/19 2315  WBC 9.3 6.7  HGB 13.1 13.0  HCT 38.7* 37.2*  MCV 93.3 91.6  PLT 296 292   Basic Metabolic Panel:  Recent Labs  Lab 06/20/19 0406 06/23/19 2315  NA 134* 132*  K 3.9 3.9  CL 97* 95*  CO2 26 26  GLUCOSE 102* 133*  BUN 30* 32*  CREATININE 1.23 1.32*  CALCIUM 9.1 8.8*    IMAGING past 24h No results found.   PHYSICAL EXAM  -- no change from yesterdays' exam General - Well nourished, well developed elderly caucasian male, in no apparent distress.   Cardiovascular - Regular rhythm and rate. Neuro - awake alert, making eye contact, tracking bilaterally. Able to follow simple commands. Moderate dysarthria with paraphasic errors, but orientated to place, self, age. But not to time. Can state his whole name. Able to name objects, repeat simple sentence but moderate dysarthric voice. Paucity of speech. Blinking to visual threat bilaterally. Right facial droop.  Tongue midline.  Left upper extremity 4/5, left lower extremity at least 3/5. Right upper extremity 3/5 with mildly increased muscle tone, and right lower extremity plegic with increased muscle tone. Right triple reflex positive. Global bradykinesia and rigidity. Sensation, coordination not cooperative and gait not tested.   ASSESSMENT/PLAN [name] is a 86 y.o. male with history of hypertension and  mild dementia (medical care at [facility]), presents to the emergency department with sudden  onset of slurred speech, aphasia, left gaze preference with right-sided neglect, right hemiparesis, facial droop and weakness. He received IV t-PA Friday 04/16/19 at 2230.  Stroke: Left ACA territory infarct, likely due to large vessel disease although cardioembolic source not excluded  VTE prophylaxis - lovenox  on ASA 325 and plavix DAPT for 3 months and then ASA alone given significant intracranial stenosis  Therapy recommendations: SNF   Consider inpt loop recorder placement on discharge   Disposition:  Pending - pt without insurance coverage - court appointed guardianship papers received - SW continues to work on placement   Medically ready for d/c once d/c plan determined.   Hypertension  On norvasc 10 mg  increase labetalol 100mg  tid -> 200 bid . BP 140-150s . goal normotensive  Hyperlipidemia  LDL 113  on lipitor 40  Dysphagia . On D2 thin liquids w/ good po intake  AKI on CKD 3a   1.23->1.32  Monitor Cre  Other Active Problems  Dementia - on galantamine and Risperdal   Hyponatremia - 134-> pending   Hospital day # 106  , MSN, APRN, ANVP-BC, AGPCNP-BC Advanced Practice Stroke Nurse [facility] See Amion for Schedule & Pager information 06/24/2019 2:23 PM    To contact Stroke Continuity provider, please refer to [facility]. After hours, contact General Neurology

## 2019-06-25 MED ORDER — LABETALOL HCL 200 MG PO TABS
200.0000 mg | ORAL_TABLET | Freq: Three times a day (TID) | ORAL | Status: DC
  Administered 2019-06-25 – 2019-07-02 (×22): 200 mg via ORAL
  Filled 2019-06-25 (×22): qty 1

## 2019-06-25 NOTE — Progress Notes (Signed)
Physical Therapy Treatment Patient Details Name: Scott Gilbert MRN: [id number] DOB: 15-Apr-1929 Today's Date: 06/25/2019    History of Present Illness 86 y.o. male with past medical history significant for hypertension and mild dementia. He presented to the emergency department with sudden onset of slurred speech and aphasia, facial droop and weakness.   CT cerebral perfusion on 04/16/19 reported: "acute distal Left ACA territory ischemia and infarct."    PT Comments    Patient is making progress toward PT goals and able to ambulate 30 ft with eva walker and max A (+2 for safety/chair follow). Continue to progress as tolerated with anticipated d/c to SNF for further skilled PT services.     Follow Up Recommendations  Supervision/Assistance - 24 hour;SNF     Equipment Recommendations  Wheelchair (measurements PT)    Recommendations for Other Services Rehab consult     Precautions / Restrictions Precautions Precautions: Fall Required Braces or Orthoses: Knee Immobilizer - Right Knee Immobilizer - Right: (for therapy use) Other Brace: R UE resting hand splint in room for use at night    Mobility  Bed Mobility Overal bed mobility: Needs Assistance Bed Mobility: Supine to Sit     Supine to sit: Mod assist     General bed mobility comments: multimodal cues for sequencing; assist with bilat LE and to elevate trunk into sitting  Transfers Overall transfer level: Needs assistance Equipment used: (eva walker) Transfers: Sit to/from Stand Sit to Stand: Mod assist;+2 safety/equipment;+2 physical assistance         General transfer comment: assist to bring R UE onto eva walker; pt began standing before therapist ready; assist to power up and to steady upon standing   Ambulation/Gait Ambulation/Gait assistance: Max assist;+2 safety/equipment Gait Distance (Feet): 30 Feet Assistive device: (eva walker) Gait Pattern/deviations: Step-to pattern;Trunk flexed;Decreased  dorsiflexion - right;Decreased step length - right;Decreased weight shift to left(R lateral bias) Gait velocity: decreased   General Gait Details: Pt required manual facilitation to advance R LE and assist to maintain balance and weight shift; kept brakes locked on eva walker to decrease lateral drift   Stairs             Wheelchair Mobility    Modified Rankin (Stroke Patients Only) Modified Rankin (Stroke Patients Only) Pre-Morbid Rankin Score: No symptoms Modified Rankin: Moderately severe disability     Balance Overall balance assessment: Needs assistance Sitting-balance support: Single extremity supported;Feet supported Sitting balance-Leahy Scale: Fair     Standing balance support: Bilateral upper extremity supported;During functional activity Standing balance-Leahy Scale: Poor                              Cognition Arousal/Alertness: Awake/alert Behavior During Therapy: WFL for tasks assessed/performed Overall Cognitive Status: History of cognitive impairments - at baseline Area of Impairment: Following commands;Safety/judgement;Problem solving                       Following Commands: Follows one step commands with increased time;Follows one step commands consistently Safety/Judgement: Decreased awareness of safety;Decreased awareness of deficits   Problem Solving: Requires verbal cues;Requires tactile cues        Exercises      General Comments        Pertinent Vitals/Pain Pain Assessment: Faces Faces Pain Scale: Hurts little more Pain Location: R LE with stretching into extension. Pain Descriptors / Indicators: Guarding;Sore;Tightness;Grimacing Pain Intervention(s): Limited activity within patient's tolerance;Monitored during session;Repositioned  Home Living                      Prior Function            PT Goals (current goals can now be found in the care plan section) Progress towards PT goals: Progressing  toward goals    Frequency    Min 3X/week      PT Plan Current plan remains appropriate    Co-evaluation              AM-PAC PT "6 Clicks" Mobility   Outcome Measure  Help needed turning from your back to your side while in a flat bed without using bedrails?: A Lot Help needed moving from lying on your back to sitting on the side of a flat bed without using bedrails?: A Lot Help needed moving to and from a bed to a chair (including a wheelchair)?: A Lot Help needed standing up from a chair using your arms (e.g., wheelchair or bedside chair)?: A Lot Help needed to walk in hospital room?: A Lot Help needed climbing 3-5 steps with a railing? : Total 6 Click Score: 11    End of Session Equipment Utilized During Treatment: Gait belt Activity Tolerance: Patient tolerated treatment well Patient left: in chair;with call bell/phone within reach;with chair alarm set Nurse Communication: Mobility status PT Visit Diagnosis: Other abnormalities of gait and mobility (R26.89);Hemiplegia and hemiparesis;Muscle weakness (generalized) (M62.81) Hemiplegia - Right/Left: Right Hemiplegia - dominant/non-dominant: Dominant Hemiplegia - caused by: Cerebral infarction     Time: 1200-1230 PT Time Calculation (min) (ACUTE ONLY): 30 min  Charges:  $Gait Training: 23-37 mins                     Earney Navy, PTA Acute Rehabilitation Services Pager: [phone] Office: [phone]     Darliss Cheney 06/25/2019, 4:19 PM

## 2019-06-25 NOTE — Progress Notes (Signed)
STROKE TEAM PROGRESS NOTE   INTERVAL HISTORY Patient sitting in chair, pleasant, comfortably without acute neuro change.  No acute event overnight.  OBJECTIVE Vitals:   06/24/19 2037 06/24/19 2355 06/25/19 0403 06/25/19 0725  BP: (!) 159/58 (!) 141/76 (!) 147/63 (!) 164/72  Pulse: 82 87 73 81  Resp: 18 17 17 18   Temp: 98.5 F (36.9 C) 97.7 F (36.5 C) 97.6 F (36.4 C) 98 F (36.7 C)  TempSrc: Oral Oral Oral Oral  SpO2: 96% 96% 95% 97%  Weight:       CBC:  Recent Labs  Lab 06/20/19 0406 06/23/19 2315  WBC 9.3 6.7  HGB 13.1 13.0  HCT 38.7* 37.2*  MCV 93.3 91.6  PLT 296 292   Basic Metabolic Panel:  Recent Labs  Lab 06/20/19 0406 06/23/19 2315  NA 134* 132*  K 3.9 3.9  CL 97* 95*  CO2 26 26  GLUCOSE 102* 133*  BUN 30* 32*  CREATININE 1.23 1.32*  CALCIUM 9.1 8.8*    IMAGING past 24h No results found.   PHYSICAL EXAM    General - Well nourished, well developed elderly caucasian male, in no apparent distress.   Cardiovascular - Regular rhythm and rate. Neuro - awake alert, making eye contact, tracking bilaterally. Able to follow simple commands. Moderate dysarthria with paraphasic errors, but orientated to place, self, age. But not to time. Can state his whole name. Able to name objects, repeat simple sentence but moderate dysarthric voice. Paucity of speech. Blinking to visual threat bilaterally. Right facial droop.  Tongue midline.  Left upper extremity 4/5, left lower extremity at least 3/5. Right upper extremity 3/5 with mildly increased muscle tone, and right lower extremity plegic with increased muscle tone. Right triple reflex positive. Global bradykinesia and rigidity. Sensation, coordination not cooperative and gait not tested.   ASSESSMENT/PLAN [name] is a 86 y.o. male with history of hypertension and  mild dementia (medical care at [facility]), presents to the emergency department with sudden onset of slurred speech, aphasia, left gaze preference  with right-sided neglect, right hemiparesis, facial droop and weakness. He received IV t-PA Friday 04/16/19 at 2230.  Stroke: Left ACA territory infarct, likely due to large vessel disease although cardioembolic source not excluded  VTE prophylaxis - lovenox  on ASA 325 and plavix DAPT for 3 months and then ASA alone given significant intracranial stenosis  Therapy recommendations: SNF   Consider inpt loop recorder placement on discharge   Disposition:  Pending - pt without insurance coverage - court appointed guardianship papers received - SW continues to work on placement   Medically ready for d/c once d/c plan determined.   Hypertension  On norvasc 10 mg  increase labetalol 100mg  tid -> 200 bid->200 tid . Mild improvement in BPs since yesterday, BP 140-150s . goal normotensive  Hyperlipidemia  LDL 113  on lipitor 40  Dysphagia . On D2 thin liquids w/ good po intake  AKI on CKD 3a   1.23->1.32  Monitor Cre  Other Active Problems  Dementia - on galantamine and Risperdal   Hyponatremia - 134-> 132  Hospital day # 79  , MD PhD Stroke Neurology 06/25/2019 9:14 PM      To contact Stroke Continuity provider, please refer to [facility]. After hours, contact General Neurology

## 2019-06-26 NOTE — Progress Notes (Signed)
STROKE TEAM PROGRESS NOTE   INTERVAL HISTORY Patient sitting in chair, pleasant, comfortably without acute neuro change.  No acute event overnight.  Vital signs remained stable  OBJECTIVE Vitals:   06/25/19 2350 06/26/19 0339 06/26/19 0811 06/26/19 1120  BP: (!) 145/59 (!) 141/57 138/68 (!) (P) 141/61  Pulse: 84 77 75 (P) 81  Resp: 15 17 18  (P) 18  Temp: 98.1 F (36.7 C) 98 F (36.7 C) 97.7 F (36.5 C) (P) 98.3 F (36.8 C)  TempSrc: Oral  Oral (P) Oral  SpO2: 96% 95% 95% (P) 97%  Weight:       CBC:  Recent Labs  Lab 06/20/19 0406 06/23/19 2315  WBC 9.3 6.7  HGB 13.1 13.0  HCT 38.7* 37.2*  MCV 93.3 91.6  PLT 296 292   Basic Metabolic Panel:  Recent Labs  Lab 06/20/19 0406 06/23/19 2315  NA 134* 132*  K 3.9 3.9  CL 97* 95*  CO2 26 26  GLUCOSE 102* 133*  BUN 30* 32*  CREATININE 1.23 1.32*  CALCIUM 9.1 8.8*    IMAGING past 24h No results found.   PHYSICAL EXAM    General - Well nourished, well developed elderly caucasian male, in no apparent distress.   Cardiovascular - Regular rhythm and rate. Neuro - awake alert, making eye contact, tracking bilaterally. Able to follow simple commands. Moderate dysarthria with paraphasic errors, but orientated to place, self, age. But not to time. Can state his whole name. Able to name objects, repeat simple sentence but moderate dysarthric voice. Paucity of speech. Blinking to visual threat bilaterally. Right facial droop.  Tongue midline.  Left upper extremity 4/5, left lower extremity at least 3/5. Right upper extremity 3/5 with mildly increased muscle tone, and right lower extremity plegic with increased muscle tone. Right triple reflex positive. Global bradykinesia and rigidity. Sensation, coordination not cooperative and gait not tested.   ASSESSMENT/PLAN [name] is a 86 y.o. male with history of hypertension and  mild dementia (medical care at [facility]), presents to the emergency department with sudden onset of  slurred speech, aphasia, left gaze preference with right-sided neglect, right hemiparesis, facial droop and weakness. He received IV t-PA Friday 04/16/19 at 2230.  Stroke: Left ACA territory infarct, likely due to large vessel disease although cardioembolic source not excluded  VTE prophylaxis - lovenox  on ASA 325 and plavix DAPT for 3 months and then ASA alone given significant intracranial stenosis  Therapy recommendations: SNF   Consider inpt loop recorder placement on discharge   Disposition:  Pending - pt without insurance coverage - court appointed guardianship papers received - SW continues to work on placement   Medically ready for d/c once d/c plan determined.   Hypertension  On norvasc 10 mg  increase labetalol 100mg  tid -> 200 bid->200 tid . Mild improvement in BPs since yesterday, BP 140-150s . goal normotensive  Hyperlipidemia  LDL 113  On lipitor 40  Dysphagia . On D2 thin liquids w/ good po intake  AKI on CKD 3a   1.23->1.32  Monitor Cre  Other Active Problems  Dementia - on galantamine and Risperdal   Hyponatremia - 134-> 132  Hospital day # 23  Continue present medical management.  Patient stable for transfer to skilled nursing facility but bed not available since patient is difficult to place. , MD     To contact Stroke Continuity provider, please refer to 62. After hours, contact General Neurology

## 2019-06-27 LAB — CBC
HCT: 34.4 % — ABNORMAL LOW (ref 39.0–52.0)
Hemoglobin: 11.7 g/dL — ABNORMAL LOW (ref 13.0–17.0)
MCH: 31.4 pg (ref 26.0–34.0)
MCHC: 34 g/dL (ref 30.0–36.0)
MCV: 92.2 fL (ref 80.0–100.0)
Platelets: 231 10*3/uL (ref 150–400)
RBC: 3.73 MIL/uL — ABNORMAL LOW (ref 4.22–5.81)
RDW: 12.5 % (ref 11.5–15.5)
WBC: 6 10*3/uL (ref 4.0–10.5)
nRBC: 0 % (ref 0.0–0.2)

## 2019-06-27 LAB — BASIC METABOLIC PANEL
Anion gap: 8 (ref 5–15)
BUN: 28 mg/dL — ABNORMAL HIGH (ref 8–23)
CO2: 26 mmol/L (ref 22–32)
Calcium: 8.7 mg/dL — ABNORMAL LOW (ref 8.9–10.3)
Chloride: 102 mmol/L (ref 98–111)
Creatinine, Ser: 1.14 mg/dL (ref 0.61–1.24)
GFR calc Af Amer: >60 mL/min (ref >60)
GFR calc non Af Amer: 58 mL/min — ABNORMAL LOW (ref >60)
Glucose, Bld: 106 mg/dL — ABNORMAL HIGH (ref 70–99)
Potassium: 4.1 mmol/L (ref 3.5–5.1)
Sodium: 136 mmol/L (ref 135–145)

## 2019-06-27 NOTE — Progress Notes (Signed)
STROKE TEAM PROGRESS NOTE   INTERVAL HISTORY Patient sitting in chair,  without acute neuro change.  No acute event overnight.  Vital signs remained stable No changes OBJECTIVE Vitals:   06/26/19 2032 06/26/19 2350 06/27/19 0404 06/27/19 1249  BP: (!) 127/59 (!) 141/62 128/62 140/65  Pulse: 77 80 73 70  Resp: 17 17 17 18   Temp: 98.3 F (36.8 C) 99.1 F (37.3 C) 98.9 F (37.2 C) 98.8 F (37.1 C)  TempSrc: Oral Oral Oral Oral  SpO2: 96% 96% 94% 95%  Weight:       CBC:  Recent Labs  Lab 06/23/19 2315 06/27/19 0559  WBC 6.7 6.0  HGB 13.0 11.7*  HCT 37.2* 34.4*  MCV 91.6 92.2  PLT 292 231   Basic Metabolic Panel:  Recent Labs  Lab 06/23/19 2315 06/27/19 0559  NA 132* 136  K 3.9 4.1  CL 95* 102  CO2 26 26  GLUCOSE 133* 106*  BUN 32* 28*  CREATININE 1.32* 1.14  CALCIUM 8.8* 8.7*    IMAGING past 24h No results found.   PHYSICAL EXAM    General - Well nourished, well developed elderly caucasian male, in no apparent distress.   Cardiovascular - Regular rhythm and rate. Neuro - awake alert, making eye contact, tracking bilaterally. Able to follow simple commands. Moderate dysarthria with paraphasic errors, but orientated to place, self, age. But not to time. Can state his whole name. Able to name objects, repeat simple sentence but moderate dysarthric voice. Paucity of speech. Blinking to visual threat bilaterally. Right facial droop.  Tongue midline.  Left upper extremity 4/5, left lower extremity at least 3/5. Right upper extremity 3/5 with mildly increased muscle tone, and right lower extremity plegic with increased muscle tone. Right triple reflex positive. Global bradykinesia and rigidity. Sensation, coordination not cooperative and gait not tested.   ASSESSMENT/PLAN [name] is a 86 y.o. male with history of hypertension and  mild dementia (medical care at [facility]), presents to the emergency department with sudden onset of slurred speech, aphasia, left gaze  preference with right-sided neglect, right hemiparesis, facial droop and weakness. He received IV t-PA Friday 04/16/19 at 2230.  Stroke: Left ACA territory infarct, likely due to large vessel disease although cardioembolic source not excluded  VTE prophylaxis - lovenox  on ASA 325 and plavix DAPT for 3 months and then ASA alone given significant intracranial stenosis  Therapy recommendations: SNF   Consider inpt loop recorder placement on discharge   Disposition:  Pending - pt without insurance coverage - court appointed guardianship papers received - SW continues to work on placement   Medically ready for d/c once d/c plan determined.   Hypertension  On norvasc 10 mg  increase labetalol 100mg  tid -> 200 bid->200 tid . Mild improvement in BPs since yesterday, BP 140-150s . goal normotensive  Hyperlipidemia  LDL 113  On lipitor 40  Dysphagia . On D2 thin liquids w/ good po intake  AKI on CKD 3a   1.23->1.32  Monitor Cre  Other Active Problems  Dementia - on galantamine and Risperdal   Hyponatremia - 134-> 132  Hospital day # 72  Continue present medical management.  Patient stable for transfer to skilled nursing facility but bed not available since patient is difficult to place.   04/18/19, MD   To contact Stroke Continuity provider, please refer to . After hours, contact General Neurology

## 2019-06-28 NOTE — Progress Notes (Signed)
Physical Therapy Treatment Patient Details Name: Scott Gilbert MRN: [id number] DOB: [birth year] Today's Date: 06/28/2019    History of Present Illness 86 y.o. male with past medical history significant for hypertension and mild dementia. He presented to the emergency department with sudden onset of slurred speech and aphasia, facial droop and weakness.   CT cerebral perfusion on 04/16/19 reported: "acute distal Left ACA territory ischemia and infarct."    PT Comments    Patient is making progress toward PT goals and tolerated short distance gait training well. Continue to progress as tolerated with anticipated d/c to SNF for further skilled PT services.     Follow Up Recommendations  Supervision/Assistance - 24 hour;SNF     Equipment Recommendations  Wheelchair (measurements PT)    Recommendations for Other Services       Precautions / Restrictions Precautions Precautions: Fall Required Braces or Orthoses: Knee Immobilizer - Right Knee Immobilizer - Right: (for therapy use) Restrictions Weight Bearing Restrictions: No    Mobility  Bed Mobility Overal bed mobility: Needs Assistance Bed Mobility: Supine to Sit     Supine to sit: Mod assist     General bed mobility comments: multimodal cues for sequencing; assist with bilat LE and to elevate trunk into sitting  Transfers Overall transfer level: Needs assistance Equipment used: (eva walker) Transfers: Sit to/from Stand Sit to Stand: Mod assist;+2 safety/equipment;+2 physical assistance         General transfer comment: assist to bring R UE onto eva walker, to power up into standing, and to gain/maintain balance once standing; pt with tendency to adduct R LE and assist needed to widen BOS  Ambulation/Gait Ambulation/Gait assistance: Max assist;+2 safety/equipment Gait Distance (Feet): 6 Feet Assistive device: (eva walker) Gait Pattern/deviations: Step-to pattern;Trunk flexed;Decreased dorsiflexion - right;Decreased  step length - right;Decreased weight shift to left(R lateral bias) Gait velocity: decreased   General Gait Details: Pt required manual facilitation to advance R LE and assist to maintain balance and weight shift; kept brakes locked on eva walker to decrease lateral drift   Stairs             Wheelchair Mobility    Modified Rankin (Stroke Patients Only) Modified Rankin (Stroke Patients Only) Pre-Morbid Rankin Score: No symptoms Modified Rankin: Moderately severe disability     Balance Overall balance assessment: Needs assistance Sitting-balance support: Feet supported Sitting balance-Leahy Scale: Fair   Postural control: Right lateral lean Standing balance support: Bilateral upper extremity supported;During functional activity Standing balance-Leahy Scale: Poor Standing balance comment: UE support and assist for balance                            Cognition Arousal/Alertness: Awake/alert Behavior During Therapy: WFL for tasks assessed/performed Overall Cognitive Status: History of cognitive impairments - at baseline Area of Impairment: Following commands;Safety/judgement;Problem solving                       Following Commands: Follows one step commands with increased time;Follows one step commands consistently Safety/Judgement: Decreased awareness of safety;Decreased awareness of deficits   Problem Solving: Requires verbal cues;Requires tactile cues;Difficulty sequencing        Exercises      General Comments General comments (skin integrity, edema, etc.): pt gaining improved control of R hand       Pertinent Vitals/Pain Pain Assessment: Faces Faces Pain Scale: No hurt    Home Living  Prior Function            PT Goals (current goals can now be found in the care plan section) Progress towards PT goals: Progressing toward goals    Frequency    Min 3X/week      PT Plan Current plan remains  appropriate    Co-evaluation PT/OT/SLP Co-Evaluation/Treatment: Yes Reason for Co-Treatment: For patient/therapist safety;To address functional/ADL transfers;Necessary to address cognition/behavior during functional activity PT goals addressed during session: Mobility/safety with mobility        AM-PAC PT "6 Clicks" Mobility   Outcome Measure  Help needed turning from your back to your side while in a flat bed without using bedrails?: A Lot Help needed moving from lying on your back to sitting on the side of a flat bed without using bedrails?: A Lot Help needed moving to and from a bed to a chair (including a wheelchair)?: A Lot Help needed standing up from a chair using your arms (e.g., wheelchair or bedside chair)?: A Lot Help needed to walk in hospital room?: A Lot Help needed climbing 3-5 steps with a railing? : Total 6 Click Score: 11    End of Session Equipment Utilized During Treatment: Gait belt Activity Tolerance: Patient tolerated treatment well Patient left: in chair;with call bell/phone within reach;with chair alarm set Nurse Communication: Mobility status PT Visit Diagnosis: Other abnormalities of gait and mobility (R26.89);Hemiplegia and hemiparesis;Muscle weakness (generalized) (M62.81) Hemiplegia - Right/Left: Right Hemiplegia - dominant/non-dominant: Dominant Hemiplegia - caused by: Cerebral infarction     Time: 3244-0102 PT Time Calculation (min) (ACUTE ONLY): 35 min  Charges:  $Gait Training: 8-22 mins                     Erline Levine, PTA Acute Rehabilitation Services Pager: [phone] Office: [phone]     Carolynne Edouard 06/28/2019, 11:18 AM

## 2019-06-28 NOTE — Plan of Care (Signed)
  Problem: Activity: Goal: Risk for activity intolerance will decrease Outcome: Progressing   Problem: Nutrition: Goal: Adequate nutrition will be maintained Outcome: Progressing   

## 2019-06-28 NOTE — TOC Progression Note (Addendum)
Transition of Care [facility]) - Progression Note    Patient Details  Name: Deondray Ospina MRN: [id number] Date of Birth: 1929/07/11  Transition of Care [facility] CM/SW Contact  Terrilee Croak, Student-Social Work Phone Number: 06/28/2019, 11:51 AM  Clinical Narrative:     MSW intern contacted [place], [facility], and [facility], to determine if they could take pt on LOG. They are actively looking and will follow up. SW will continue to follow for discharge planning.    Addendum 06/28/19 2:25 pm Pt's daughter called to advise that pt's wallet and keys were found. She also asked for an update and she had questions answered. Sw will continue to follow.  3:54 pm Lacinda Axon is following. They asked if the loop recorder would be covered by LOG for monthly readings. TOC lead contacted, she was unsure. Phone call in to representative for loop recorders in the hospital, waiting on response. If the cost is low, pt family may be asked to cover it. Will update with more information. Expected Discharge Plan: IP Rehab Facility Barriers to Discharge: Continued Medical Work up, Inadequate or no insurance  Expected Discharge Plan and Services Expected Discharge Plan: IP Rehab Facility       Living arrangements for the past 2 months: Single Family Home                                       Social Determinants of Health (SDOH) Interventions    Readmission Risk Interventions No flowsheet data found.

## 2019-06-28 NOTE — Progress Notes (Signed)
Occupational Therapy Treatment Patient Details Name: Scott Gilbert MRN: [id number] DOB: 12/31/1929 Today's Date: 06/28/2019    History of present illness 86 y.o. male with past medical history significant for hypertension and mild dementia. He presented to the emergency department with sudden onset of slurred speech and aphasia, facial droop and weakness.   CT cerebral perfusion on 04/16/19 reported: "acute distal Left ACA territory ischemia and infarct."   OT comments  Patient continues to make steady progress towards goals in skilled OT session. Patient's session encompassed increased AROM of RUE and functional ambulation. Pt seen with PT to further progress activity tolerance. Pt noted to have increased ability in R thumb opposition, able to complete 5x with tactile cues. Pt also demonstrated increased grasp and release with RUE, however when pt fatigues, pt perseverates on releasing RUE with assist from the L. Pt continues to benefit from skilled therapy to address functional deficits; will continue to follow acutely.    Follow Up Recommendations  Supervision/Assistance - 24 hour;SNF    Equipment Recommendations  Wheelchair (measurements OT);Wheelchair cushion (measurements OT);3 in 1 bedside commode    Recommendations for Other Services      Precautions / Restrictions Precautions Precautions: Fall Precaution Comments: short length KI Required Braces or Orthoses: Knee Immobilizer - Right Knee Immobilizer - Right: (for therapy use) Other Brace: R UE resting hand splint in room for use at night Restrictions Weight Bearing Restrictions: No       Mobility Bed Mobility Overal bed mobility: Needs Assistance Bed Mobility: Supine to Sit     Supine to sit: Mod assist     General bed mobility comments: multimodal cues for sequencing; assist with bilat LE and to elevate trunk into sitting  Transfers Overall transfer level: Needs assistance Equipment used: Carley Hammed  walker) Transfers: Sit to/from Stand Sit to Stand: Mod assist;+2 safety/equipment;+2 physical assistance         General transfer comment: assist to bring R UE onto eva walker, to power up into standing, and to gain/maintain balance once standing; pt with tendency to adduct R LE and assist needed to widen BOS    Balance Overall balance assessment: Needs assistance Sitting-balance support: Feet supported Sitting balance-Leahy Scale: Fair   Postural control: Right lateral lean Standing balance support: Bilateral upper extremity supported;During functional activity Standing balance-Leahy Scale: Poor Standing balance comment: UE support and assist for balance                           ADL either performed or assessed with clinical judgement   ADL Overall ADL's : Needs assistance/impaired                         Toilet Transfer: Moderate assistance;Cueing for safety;Cueing for sequencing;+2 for physical assistance;Ambulation Toilet Transfer Details (indicate cue type and reason): Pt noted to have a bowel movement while ambulating, pt unaware Toileting- Clothing Manipulation and Hygiene: Total assistance Toileting - Clothing Manipulation Details (indicate cue type and reason): Pt noted to be incontinent of bowel upon standing. 2 person assist to assist pt with standing balance and perform peri care.      Functional mobility during ADLs: Moderate assistance;Cueing for safety;Cueing for sequencing;+2 for physical assistance       Vision       Perception     Praxis      Cognition Arousal/Alertness: Awake/alert Behavior During Therapy: WFL for tasks assessed/performed Overall Cognitive Status: History of  cognitive impairments - at baseline Area of Impairment: Following commands;Safety/judgement;Problem solving                       Following Commands: Follows one step commands with increased time;Follows one step commands  consistently Safety/Judgement: Decreased awareness of safety;Decreased awareness of deficits Awareness: Emergent Problem Solving: Requires verbal cues;Requires tactile cues;Difficulty sequencing General Comments: following commands with increased time and giggles at all request        Exercises Hand Exercises Digit Composite Flexion: Right;AROM;10 reps;AAROM Composite Extension: AROM;Right;10 reps Opposition: AROM;5 reps;Right   Shoulder Instructions       General Comments pt gaining improved control of R hand     Pertinent Vitals/ Pain       Pain Assessment: Faces Faces Pain Scale: No hurt  Home Living                                          Prior Functioning/Environment              Frequency  Min 2X/week        Progress Toward Goals  OT Goals(current goals can now be found in the care plan section)  Progress towards OT goals: Progressing toward goals  Acute Rehab OT Goals Patient Stated Goal: none stated OT Goal Formulation: Patient unable to participate in goal setting Time For Goal Achievement: 07/06/19  Plan Discharge plan remains appropriate    Co-evaluation      Reason for Co-Treatment: For patient/therapist safety;To address functional/ADL transfers;Necessary to address cognition/behavior during functional activity PT goals addressed during session: Mobility/safety with mobility OT goals addressed during session: ADL's and self-care;Strengthening/ROM      AM-PAC OT "6 Clicks" Daily Activity     Outcome Measure   Help from another person eating meals?: A Lot Help from another person taking care of personal grooming?: A Little Help from another person toileting, which includes using toliet, bedpan, or urinal?: A Lot Help from another person bathing (including washing, rinsing, drying)?: A Lot Help from another person to put on and taking off regular upper body clothing?: A Lot Help from another person to put on and taking  off regular lower body clothing?: A Lot 6 Click Score: 13    End of Session Equipment Utilized During Treatment: Gait belt;Other (comment)(Eva walker)  OT Visit Diagnosis: Unsteadiness on feet (R26.81);Feeding difficulties (R63.3)   Activity Tolerance Patient tolerated treatment well   Patient Left in chair;with call bell/phone within reach;with chair alarm set   Nurse Communication Mobility status;Precautions        Time: 4818-5631 OT Time Calculation (min): 35 min  Charges: OT General Charges $OT Visit: 1 Visit OT Treatments $Self Care/Home Management : 8-22 mins  Corinne Ports E. Jaylene Schrom, COTA/L Acute Rehabilitation Services [place] 06/28/2019, 2:20 PM

## 2019-06-28 NOTE — Progress Notes (Signed)
STROKE TEAM PROGRESS NOTE   INTERVAL HISTORY Lying in bed. No change overnight. SW continues to work on placement.   OBJECTIVE Vitals:   06/28/19 0018 06/28/19 0357 06/28/19 0829 06/28/19 1113  BP: (!) 126/57 131/61 (!) 143/69 (!) 146/56  Pulse: 81 72 76 69  Resp: 17 17 18 16   Temp: 99.6 F (37.6 C) 98.6 F (37 C) 98.2 F (36.8 C) 98.3 F (36.8 C)  TempSrc: Oral Oral Oral Oral  SpO2: 95% 94% 96% 96%  Weight:       CBC:  Recent Labs  Lab 06/23/19 2315 06/27/19 0559  WBC 6.7 6.0  HGB 13.0 11.7*  HCT 37.2* 34.4*  MCV 91.6 92.2  PLT 292 231   Basic Metabolic Panel:  Recent Labs  Lab 06/23/19 2315 06/27/19 0559  NA 132* 136  K 3.9 4.1  CL 95* 102  CO2 26 26  GLUCOSE 133* 106*  BUN 32* 28*  CREATININE 1.32* 1.14  CALCIUM 8.8* 8.7*    IMAGING past 24h No results found.   PHYSICAL EXAM   - unchanged General - Well nourished, well developed elderly caucasian male, in no apparent distress.   Cardiovascular - Regular rhythm and rate. Neuro - awake alert, making eye contact, tracking bilaterally. Able to follow simple commands. Moderate dysarthria with paraphasic errors, but orientated to place, self, age. But not to time. Can state his whole name. Able to name objects, repeat simple sentence but moderate dysarthric voice. Paucity of speech. Blinking to visual threat bilaterally. Right facial droop.  Tongue midline.  Left upper extremity 4/5, left lower extremity at least 3/5. Right upper extremity 3/5 with mildly increased muscle tone, and right lower extremity plegic with increased muscle tone. Right triple reflex positive. Global bradykinesia and rigidity. Sensation, coordination not cooperative and gait not tested.   ASSESSMENT/PLAN Mr. Yassen Kinnett is a 86 y.o. male with history of hypertension and  mild dementia (medical care at [facility]), presents to the emergency department with sudden onset of slurred speech, aphasia, left gaze preference with right-sided neglect,  right hemiparesis, facial droop and weakness. He received IV t-PA Friday 04/16/19 at 2230.  Stroke: Left ACA territory infarct, likely due to large vessel disease although cardioembolic source not excluded  VTE prophylaxis - lovenox  on ASA 325 and plavix DAPT for 3 months and then ASA alone given significant intracranial stenosis  Therapy recommendations: SNF   Consider inpt loop recorder placement on discharge   Disposition:  Pending - pt without insurance coverage - court appointed guardianship papers received - SW continues to work on placement   Medically ready for d/c once d/c plan determined.   Hypertension  On norvasc 10 mg  Labetalol 200mg  tid (increased last week)  BP 140s  goal normotensive  Hyperlipidemia  LDL 113  On lipitor 40  Dysphagia . On D2 thin liquids w/ good po intake  AKI on CKD 3a   1.14  Monitor Cre  Other Active Problems  Dementia - on galantamine and Risperdal   Hyponatremia - 136 - resolved  Hospital day # 71  Continue ongoing management.  Patient medically stable to be transferred to skilled nursing facility when bed becomes available.   , MD   To contact Stroke Continuity provider, please refer to 65. After hours, contact General Neurology

## 2019-06-29 NOTE — Plan of Care (Signed)
  Problem: Activity: Goal: Risk for activity intolerance will decrease Outcome: Progressing   Problem: Nutrition: Goal: Adequate nutrition will be maintained Outcome: Progressing   Problem: Safety: Goal: Ability to remain free from injury will improve Outcome: Progressing   

## 2019-06-29 NOTE — Progress Notes (Signed)
STROKE TEAM PROGRESS NOTE   INTERVAL HISTORY Lying in bed. No change overnight. SW continues to work on placement. Vitals stable  OBJECTIVE Vitals:   06/28/19 2359 06/29/19 0400 06/29/19 1000 06/29/19 1134  BP: 137/60 132/70 (!) 145/60 137/61  Pulse: 84 86 71 72  Resp: 16 20 18 16   Temp: 97.8 F (36.6 C) 97.8 F (36.6 C) 97.7 F (36.5 C) 98.1 F (36.7 C)  TempSrc: Oral Oral Oral Oral  SpO2: 96% 98% 99% 98%  Weight:       CBC:  Recent Labs  Lab 06/23/19 2315 06/27/19 0559  WBC 6.7 6.0  HGB 13.0 11.7*  HCT 37.2* 34.4*  MCV 91.6 92.2  PLT 292 231   Basic Metabolic Panel:  Recent Labs  Lab 06/23/19 2315 06/27/19 0559  NA 132* 136  K 3.9 4.1  CL 95* 102  CO2 26 26  GLUCOSE 133* 106*  BUN 32* 28*  CREATININE 1.32* 1.14  CALCIUM 8.8* 8.7*    IMAGING past 24h No results found.   PHYSICAL EXAM   - unchanged General - Well nourished, well developed elderly caucasian male, in no apparent distress.   Cardiovascular - Regular rhythm and rate. Neuro - awake alert, making eye contact, tracking bilaterally. Able to follow simple commands. Moderate dysarthria with paraphasic errors, but orientated to place, self, age. But not to time. Can state his whole name. Able to name objects, repeat simple sentence but moderate dysarthric voice. Paucity of speech. Blinking to visual threat bilaterally. Right facial droop.  Tongue midline.  Left upper extremity 4/5, left lower extremity at least 3/5. Right upper extremity 3/5 with mildly increased muscle tone, and right lower extremity plegic with increased muscle tone. Right triple reflex positive. Global bradykinesia and rigidity. Sensation, coordination not cooperative and gait not tested.   ASSESSMENT/PLAN [name] is a 86 y.o. male with history of hypertension and  mild dementia (medical care at [facility]), presents to the emergency department with sudden onset of slurred speech, aphasia, left gaze preference with right-sided  neglect, right hemiparesis, facial droop and weakness. He received IV t-PA Friday 04/16/19 at 2230.  Stroke: Left ACA territory infarct, likely due to large vessel disease although cardioembolic source not excluded  VTE prophylaxis - lovenox  on ASA 325 and plavix DAPT for 3 months and then ASA alone given significant intracranial stenosis  Therapy recommendations: SNF   Consider inpt loop recorder placement on discharge   Disposition:  Pending - pt without insurance coverage - court appointed guardianship papers received - SW continues to work on placement   Medically ready for d/c once d/c plan determined.   Hypertension  On norvasc 10 mg  Labetalol 200mg  tid (increased last week)  BP 140s  goal normotensive  Hyperlipidemia  LDL 113  On lipitor 40  Dysphagia . On D2 thin liquids w/ good po intake  AKI on CKD 3a   1.14  Monitor Cre  Other Active Problems  Dementia - on galantamine and Risperdal   Hyponatremia - 136 - resolved  Hospital day # 43  Continue ongoing management.  Patient medically stable to be transferred to skilled nursing facility when bed becomes available.   , MD   To contact Stroke Continuity provider, please refer to 66. After hours, contact General Neurology

## 2019-06-30 NOTE — Plan of Care (Signed)
  Problem: Activity: Goal: Risk for activity intolerance will decrease 06/30/2019 0712 by Dallas Breeding, RN Outcome: Progressing 06/30/2019 0712 by Dallas Breeding, RN Outcome: Progressing   Problem: Nutrition: Goal: Adequate nutrition will be maintained 06/30/2019 0712 by Dallas Breeding, RN Outcome: Progressing 06/30/2019 0712 by Dallas Breeding, RN Outcome: Progressing

## 2019-06-30 NOTE — Progress Notes (Signed)
STROKE TEAM PROGRESS NOTE   INTERVAL HISTORY Lying in bed. Getting a bath. SW continues to work on placement. Vitals stable  OBJECTIVE Vitals:   06/29/19 2025 06/29/19 2317 06/30/19 0356 06/30/19 0739  BP: (!) 147/63 (!) 144/63 133/65 133/66  Pulse: 71 76 75 69  Resp: 17 20 20 18   Temp: 97.8 F (36.6 C) 98.7 F (37.1 C) 98.3 F (36.8 C) 98 F (36.7 C)  TempSrc: Axillary Oral Oral Oral  SpO2: 96% 94% 95% 97%  Weight:       CBC:  Recent Labs  Lab 06/23/19 2315 06/27/19 0559  WBC 6.7 6.0  HGB 13.0 11.7*  HCT 37.2* 34.4*  MCV 91.6 92.2  PLT 292 231   Basic Metabolic Panel:  Recent Labs  Lab 06/23/19 2315 06/27/19 0559  NA 132* 136  K 3.9 4.1  CL 95* 102  CO2 26 26  GLUCOSE 133* 106*  BUN 32* 28*  CREATININE 1.32* 1.14  CALCIUM 8.8* 8.7*    IMAGING past 24h No results found.   PHYSICAL EXAM   - unchanged General - Well nourished, well developed elderly caucasian male, in no apparent distress.   Cardiovascular - Regular rhythm and rate. Neuro - awake alert, making eye contact, tracking bilaterally. Able to follow simple commands. Moderate dysarthria with paraphasic errors, but orientated to place, self, age. But not to time. Can state his whole name. Able to name objects, repeat simple sentence but moderate dysarthric voice. Paucity of speech. Blinking to visual threat bilaterally. Right facial droop.  Tongue midline.  Left upper extremity 4/5, left lower extremity at least 3/5. Right upper extremity 3/5 with mildly increased muscle tone, and right lower extremity plegic with increased muscle tone. Right triple reflex positive. Global bradykinesia and rigidity. Sensation, coordination not cooperative and gait not tested.   ASSESSMENT/PLAN [name] is a 86 y.o. male with history of hypertension and  mild dementia (medical care at [facility]), presents to the emergency department with sudden onset of slurred speech, aphasia, left gaze preference with right-sided  neglect, right hemiparesis, facial droop and weakness. He received IV t-PA Friday 04/16/19 at 2230.  Stroke: Left ACA territory infarct, likely due to large vessel disease although cardioembolic source not excluded  VTE prophylaxis - lovenox  on ASA 325 and plavix DAPT for 3 months and then ASA alone given significant intracranial stenosis  Therapy recommendations: SNF   Consider inpt loop recorder placement on discharge   Disposition:  Pending - pt without insurance coverage - court appointed guardianship papers received - SW continues to work on placement   Medically ready for d/c once d/c plan determined.   Hypertension  On norvasc 10 mg  Labetalol 200mg  tid (increased last week)  BP 140s  goal normotensive  Hyperlipidemia  LDL 113  On lipitor 40  Dysphagia . On D2 thin liquids w/ good po intake  AKI on CKD 3a   1.14  Monitor Cre  Other Active Problems  Dementia - on galantamine and Risperdal   Hyponatremia - 136 - resolved  Hospital day # 75  Continue ongoing management.  Patient medically stable to be transferred to skilled nursing facility when bed becomes available.   04/18/19, MD   To contact Stroke Continuity provider, please refer to . After hours, contact General Neurology

## 2019-06-30 NOTE — Progress Notes (Signed)
Has been pleasant and cooperative tonight.  Has not attempted to remove condom cath.  Has allowed this nurse to turn and reposition him and has even assisted.  No signs of distress.  No complaints.

## 2019-06-30 NOTE — Progress Notes (Signed)
Physical Therapy Treatment Patient Details Name: Scott Gilbert MRN: [id number] DOB: 1930/01/17 Today's Date: 06/30/2019    History of Present Illness 86 y.o. male with past medical history significant for hypertension and mild dementia. He presented to the emergency department with sudden onset of slurred speech and aphasia, facial droop and weakness.   CT cerebral perfusion on 04/16/19 reported: "acute distal Left ACA territory ischemia and infarct."    PT Comments    Patient seen for mobility progression. This session focused on functional transfer/gait training. Continue to progress as tolerated with anticipated d/c to SNF for further skilled PT services.     Follow Up Recommendations  Supervision/Assistance - 24 hour;SNF     Equipment Recommendations  Wheelchair (measurements PT)    Recommendations for Other Services       Precautions / Restrictions Precautions Precautions: Fall Precaution Comments: short length KI Required Braces or Orthoses: Knee Immobilizer - Right Knee Immobilizer - Right: (for therapy use) Other Brace: R UE resting hand splint in room for use at night Restrictions Weight Bearing Restrictions: No    Mobility  Bed Mobility               General bed mobility comments: pt OOB in chair upon arrival  Transfers Overall transfer level: Needs assistance Equipment used: [place] walker) Transfers: Sit to/from Stand Sit to Stand: Mod assist;+2 safety/equipment;+2 physical assistance         General transfer comment: multimodal cues and assist needed for positioning of R UE and vc for L hand placement; assistance required to power up into standing from recliner and BSC; decreased assist needed from [facility]; initial stand without KI and bilat LE buckling noted when attempting to take a step so returned to sitting to don KI  Ambulation/Gait Ambulation/Gait assistance: Max assist;+2 safety/equipment;+2 physical assistance Gait Distance (Feet): (12 ft total  with stop for use of BSC ) Assistive device: (eva walker) Gait Pattern/deviations: Step-to pattern;Trunk flexed;Decreased dorsiflexion - right;Decreased step length - right;Decreased weight shift to left(R lateral bias) Gait velocity: decreased   General Gait Details: R LE maintained in flexion however is decreased by use of KI; manual facilitation to advance R LE (noted pt attmepting to advance R LE) and assist to maintain balance and weight shift; kept brakes locked on eva walker to decrease lateral drift   Stairs             Wheelchair Mobility    Modified Rankin (Stroke Patients Only) Modified Rankin (Stroke Patients Only) Pre-Morbid Rankin Score: No symptoms Modified Rankin: Moderately severe disability     Balance Overall balance assessment: Needs assistance Sitting-balance support: Feet supported Sitting balance-Leahy Scale: Fair     Standing balance support: Bilateral upper extremity supported;During functional activity Standing balance-Leahy Scale: Poor Standing balance comment: UE support and assist for balance                            Cognition Arousal/Alertness: Awake/alert Behavior During Therapy: WFL for tasks assessed/performed Overall Cognitive Status: History of cognitive impairments - at baseline Area of Impairment: Following commands;Safety/judgement;Problem solving                       Following Commands: Follows one step commands with increased time;Follows one step commands consistently Safety/Judgement: Decreased awareness of safety;Decreased awareness of deficits   Problem Solving: Requires verbal cues;Requires tactile cues;Difficulty sequencing        Exercises  General Comments        Pertinent Vitals/Pain Pain Assessment: Faces Faces Pain Scale: Hurts little more Pain Location: R LE with stretching into extension Pain Descriptors / Indicators: Grimacing Pain Intervention(s): Monitored during  session;Limited activity within patient's tolerance    Home Living                      Prior Function            PT Goals (current goals can now be found in the care plan section) Progress towards PT goals: Progressing toward goals    Frequency    Min 3X/week      PT Plan Current plan remains appropriate    Co-evaluation PT/OT/SLP Co-Evaluation/Treatment: Yes Reason for Co-Treatment: For patient/therapist safety;To address functional/ADL transfers PT goals addressed during session: Mobility/safety with mobility        AM-PAC PT "6 Clicks" Mobility   Outcome Measure  Help needed turning from your back to your side while in a flat bed without using bedrails?: A Lot Help needed moving from lying on your back to sitting on the side of a flat bed without using bedrails?: A Lot Help needed moving to and from a bed to a chair (including a wheelchair)?: A Lot Help needed standing up from a chair using your arms (e.g., wheelchair or bedside chair)?: A Lot Help needed to walk in hospital room?: A Lot Help needed climbing 3-5 steps with a railing? : Total 6 Click Score: 11    End of Session Equipment Utilized During Treatment: Gait belt Activity Tolerance: Patient tolerated treatment well Patient left: in chair;with call bell/phone within reach;with chair alarm set Nurse Communication: Mobility status PT Visit Diagnosis: Other abnormalities of gait and mobility (R26.89);Hemiplegia and hemiparesis;Muscle weakness (generalized) (M62.81) Hemiplegia - Right/Left: Right Hemiplegia - dominant/non-dominant: Dominant Hemiplegia - caused by: Cerebral infarction     Time: 1250-1331 PT Time Calculation (min) (ACUTE ONLY): 41 min  Charges:  $Gait Training: 8-22 mins                     Erline Levine, PTA Acute Rehabilitation Services Pager: [phone] Office: [phone]     Carolynne Edouard 06/30/2019, 2:49 PM

## 2019-06-30 NOTE — Progress Notes (Signed)
Occupational Therapy Treatment Patient Details Name: Scott Gilbert MRN: [id number] DOB: 04/30/1929 Today's Date: 06/30/2019    History of present illness 86 y.o. male with past medical history significant for hypertension and mild dementia. He presented to the emergency department with sudden onset of slurred speech and aphasia, facial droop and weakness.   CT cerebral perfusion on 04/16/19 reported: "acute distal Left ACA territory ischemia and infarct."   OT comments  Pt seen for OT f/u with focus on functional BADL transfer and RUE neuro facilitation. Pt completed BSC transfer for BM with mod A +2 and use of eva walker. Total A required for peri hygiene due to pt needing BUE support to maintain standing balance safely. Pt continues to require max cues to incorporate RUE. Noted pt having difficulty with grasp/relase of RUE this date. Pt was able to reach RUE up to ~90 degrees for target 2/3 attempts. D/c plan remains appropriate, will continue to follow.   Follow Up Recommendations  SNF;Supervision/Assistance - 24 hour    Equipment Recommendations  Wheelchair (measurements OT);Wheelchair cushion (measurements OT);3 in 1 bedside commode;Hospital bed    Recommendations for Other Services      Precautions / Restrictions Precautions Precautions: Fall Precaution Comments: short length KI Required Braces or Orthoses: Knee Immobilizer - Right Knee Immobilizer - Right: (for therapy use) Other Brace: R UE resting hand splint in room for use at night Restrictions Weight Bearing Restrictions: No       Mobility Bed Mobility               General bed mobility comments: pt OOB in chair upon arrival  Transfers Overall transfer level: Needs assistance Equipment used: (eva walker) Transfers: Sit to/from Stand Sit to Stand: Mod assist;+2 safety/equipment;+2 physical assistance         General transfer comment: multimodal cues and assist needed for positioning of R UE and vc for L  hand placement; assistance required to power up into standing from recliner and BSC; decreased assist needed from [facility]; initial stand without KI and bilat LE buckling noted when attempting to take a step so returned to sitting to don KI    Balance Overall balance assessment: Needs assistance Sitting-balance support: Feet supported Sitting balance-Leahy Scale: Fair     Standing balance support: Bilateral upper extremity supported;During functional activity Standing balance-Leahy Scale: Poor Standing balance comment: UE support and assist for balance                           ADL either performed or assessed with clinical judgement   ADL Overall ADL's : Needs assistance/impaired     Grooming: Set up;Sitting Grooming Details (indicate cue type and reason): primarly using LUE despite cues. Difficulty with grasp/release with RUE                 Toilet Transfer: Moderate assistance;Cueing for safety;Cueing for sequencing;+2 for physical assistance;Ambulation Toilet Transfer Details (indicate cue type and reason): use of eva walker to [facility] Toileting- Clothing Manipulation and Hygiene: Total assistance Toileting - Clothing Manipulation Details (indicate cue type and reason): total A for peri hygiene after BM from [facility]     Functional mobility during ADLs: Moderate assistance;Cueing for safety;Cueing for sequencing;+2 for physical assistance       Vision Patient Visual Report: No change from baseline     Perception     Praxis      Cognition Arousal/Alertness: Awake/alert Behavior During Therapy: [employer] for tasks assessed/performed  Overall Cognitive Status: History of cognitive impairments - at baseline Area of Impairment: Following commands;Safety/judgement;Problem solving                       Following Commands: Follows one step commands with increased time;Follows one step commands consistently Safety/Judgement: Decreased awareness of safety;Decreased  awareness of deficits   Problem Solving: Requires verbal cues;Requires tactile cues;Difficulty sequencing General Comments: increased time and cueing to follow simple commands, often laughs at all requests.        Exercises     Shoulder Instructions       General Comments      Pertinent Vitals/ Pain       Pain Assessment: Faces Faces Pain Scale: Hurts little more Pain Location: R LE with stretching into extension Pain Descriptors / Indicators: Grimacing Pain Intervention(s): Monitored during session;Limited activity within patient's tolerance  Home Living                                          Prior Functioning/Environment              Frequency  Min 2X/week        Progress Toward Goals  OT Goals(current goals can now be found in the care plan section)  Progress towards OT goals: Progressing toward goals  Acute Rehab OT Goals OT Goal Formulation: Patient unable to participate in goal setting Time For Goal Achievement: 07/06/19 Potential to Achieve Goals: Fair  Plan Discharge plan remains appropriate    Co-evaluation    PT/OT/SLP Co-Evaluation/Treatment: Yes Reason for Co-Treatment: For patient/therapist safety;To address functional/ADL transfers PT goals addressed during session: Mobility/safety with mobility OT goals addressed during session: ADL's and self-care;Strengthening/ROM;Proper use of Adaptive equipment and DME      AM-PAC OT "6 Clicks" Daily Activity     Outcome Measure   Help from another person eating meals?: A Lot Help from another person taking care of personal grooming?: A Little Help from another person toileting, which includes using toliet, bedpan, or urinal?: A Lot Help from another person bathing (including washing, rinsing, drying)?: A Lot Help from another person to put on and taking off regular upper body clothing?: A Lot Help from another person to put on and taking off regular lower body clothing?: A  Lot 6 Click Score: 13    End of Session Equipment Utilized During Treatment: Gait belt;Other (comment)(eva walker)  OT Visit Diagnosis: Unsteadiness on feet (R26.81);Feeding difficulties (R63.3);Other symptoms and signs involving cognitive function   Activity Tolerance Patient tolerated treatment well   Patient Left in chair;with call bell/phone within reach;with chair alarm set   Nurse Communication Mobility status;Precautions        Time: 1249-1330 OT Time Calculation (min): 41 min  Charges: OT General Charges $OT Visit: 1 Visit OT Treatments $Self Care/Home Management : 8-22 mins $Neuromuscular Re-education: 8-22 mins  Dalphine Handing, MSOT, OTR/L Acute Rehabilitation Services [facility] Office Number: [phone] Pager: [phone]   Dalphine Handing 06/30/2019, 6:34 PM

## 2019-06-30 NOTE — TOC Progression Note (Signed)
Transition of Care [facility]) - Progression Note    Patient Details  Name: Scott Gilbert MRN: [id number] Date of Birth: [birth year]  Transition of Care [facility]) CM/SW Contact  Terrilee Croak, Student-Social Work Phone Number: 06/30/2019, 4:18 PM  Clinical Narrative:    Per doctor, pt no longer needs loop recorder, which was a major barrier. [facility] updated.   Expected Discharge Plan: IP Rehab Facility Barriers to Discharge: Continued Medical Work up, Inadequate or no insurance  Expected Discharge Plan and Services Expected Discharge Plan: IP Rehab Facility       Living arrangements for the past 2 months: Single Family Home                                       Social Determinants of Health (SDOH) Interventions    Readmission Risk Interventions No flowsheet data found.

## 2019-07-01 LAB — CBC
HCT: 33.8 % — ABNORMAL LOW (ref 39.0–52.0)
Hemoglobin: 11.4 g/dL — ABNORMAL LOW (ref 13.0–17.0)
MCH: 31.1 pg (ref 26.0–34.0)
MCHC: 33.7 g/dL (ref 30.0–36.0)
MCV: 92.3 fL (ref 80.0–100.0)
Platelets: 220 10*3/uL (ref 150–400)
RBC: 3.66 MIL/uL — ABNORMAL LOW (ref 4.22–5.81)
RDW: 12.6 % (ref 11.5–15.5)
WBC: 6.4 10*3/uL (ref 4.0–10.5)
nRBC: 0 % (ref 0.0–0.2)

## 2019-07-01 LAB — SARS CORONAVIRUS 2 (TAT 6-24 HRS): SARS Coronavirus 2: NEGATIVE

## 2019-07-01 NOTE — TOC Progression Note (Signed)
Transition of Care [facility]) - Progression Note    Patient Details  Name: Scott Gilbert MRN: [id number] Date of Birth: Jun 03, 1929  Transition of Care [facility]) CM/SW Contact  Baldemar Lenis, [name] Phone Number: 07/01/2019, 1:46 PM  Clinical Narrative:   CSW spoke with patient's daughter, Scott Gilbert, earlier today to provide bed offer of [facility]. Scott Gilbert to review and CSW to touch base with her again later today. Scott Gilbert also expressed concern about mixed messages she's getting on signing the patient up for Medicare, and CSW researched what would provide the patient with the best coverage at [facility].   CSW spoke with Scott Gilbert again, she would like to speak with her sisters about [facility] first. She has some concerns about the SNF from the research she's done. CSW explained that there won't be a lot of great options as the patient doesn't have a payor source at this time, and Scott Gilbert is aware, but would still like to talk with her sisters before agreeing to move the patient. CSW gave Scott Gilbert information obtained about how to best sign the patient up for Medicare, and Scott Gilbert appreciative of information. CSW to follow.    Expected Discharge Plan: IP Rehab Facility Barriers to Discharge: Continued Medical Work up, Inadequate or no insurance  Expected Discharge Plan and Services Expected Discharge Plan: IP Rehab Facility       Living arrangements for the past 2 months: Single Family Home                                       Social Determinants of Health (SDOH) Interventions    Readmission Risk Interventions No flowsheet data found.

## 2019-07-01 NOTE — NC FL2 (Addendum)
[place] MEDICAID FL2 LEVEL OF CARE SCREENING TOOL     IDENTIFICATION  Patient Name: Scott Gilbert Birthdate: 01/02/1930 Sex: male Admission Date (Current Location): 04/16/2019  [facility] and [place] Number:  Producer, television/film/video and Address:  [facility] [place], [address], [place], [name] [address]      Provider Number: [id number]  Attending Physician Name and Address:  Micki Riley, MD  Relative Name and Phone Number:  Steward Drone [phone]    Current Level of Care: Hospital Recommended Level of Care: Skilled Nursing Facility Prior Approval Number:    Date Approved/Denied:   PASRR Number: [id number]  Discharge Plan: SNF    Current Diagnoses: Patient Active Problem List   Diagnosis Date Noted   Palliative care by specialist    Goals of care, counseling/discussion    Muscular weakness    Xerostomia    Problem related to social environment    Spiritual distress    Stroke (cerebrum) (HCC) 04/16/2019    Orientation RESPIRATION BLADDER Height & Weight     Self, Place  Normal Incontinent Weight: 156 lb 12 oz (71.1 kg) Height:     BEHAVIORAL SYMPTOMS/MOOD NEUROLOGICAL BOWEL NUTRITION STATUS      Incontinent Diet(see DC summary)  AMBULATORY STATUS COMMUNICATION OF NEEDS Skin   Extensive Assist Verbally Skin abrasions                       Personal Care Assistance Level of Assistance  Bathing, Feeding, Dressing Bathing Assistance: Maximum assistance Feeding assistance: Maximum assistance Dressing Assistance: Maximum assistance     Functional Limitations Info  Speech Sight Info: Adequate Hearing Info: Adequate Speech Info: Impaired(dysarthria)    SPECIAL CARE FACTORS FREQUENCY  PT (By licensed PT), OT (By licensed OT)     PT Frequency: 5x/wk OT Frequency: 5x/wk     Speech Therapy Frequency: 5x week      Contractures Contractures Info: Not present    Additional Factors Info  Code Status, Allergies,  Psychotropic Code Status Info: Full Allergies Info: NKA Psychotropic Info: Risperdal 0.5mg  daily at bed         Current Medications (07/01/2019):  This is the current hospital active medication list Current Facility-Administered Medications  Medication Dose Route Frequency Provider Last Rate Last Admin    stroke: mapping our early stages of recovery book   Does not apply Once Aroor, Dara Lords, MD       acetaminophen (TYLENOL) tablet 650 mg  650 mg Oral Q4H PRN Aroor, Dara Lords, MD   650 mg at 06/16/19 2308   Or   acetaminophen (TYLENOL) 160 MG/5ML solution 650 mg  650 mg Per Tube Q4H PRN Aroor, Dara Lords, MD   650 mg at 04/17/19 1618   Or   acetaminophen (TYLENOL) suppository 650 mg  650 mg Rectal Q4H PRN Aroor, Dara Lords, MD       acyclovir (ZOVIRAX) tablet 400 mg  400 mg Oral BID Marvel Plan, MD   400 mg at 07/01/19 0921   amLODipine (NORVASC) tablet 10 mg  10 mg Oral Daily Marvel Plan, MD   10 mg at 07/01/19 0086   aspirin chewable tablet 81 mg  81 mg Oral Daily Marvel Plan, MD   81 mg at 07/01/19 0921   atorvastatin (LIPITOR) tablet 40 mg  40 mg Oral q1800 Marvel Plan, MD   40 mg at 06/30/19 1707   clopidogrel (PLAVIX) tablet 75 mg  75 mg Oral Daily Xu,  Jindong, MD   75 mg at 07/01/19 0922   enoxaparin (LOVENOX) injection 30 mg  30 mg Subcutaneous Q24H [name], MD   30 mg at 07/01/19 6226   feeding supplement (ENSURE ENLIVE) (ENSURE ENLIVE) liquid 237 mL  237 mL Oral BID BM Rosezella Rumpf, NP   237 mL at 07/01/19 3335   galantamine (RAZADYNE) tablet 12 mg  12 mg Oral QHS [name], MD   12 mg at 06/30/19 2131   labetalol (NORMODYNE) injection 5-10 mg  5-10 mg Intravenous Q10 min PRN [name], MD   10 mg at 05/06/19 0340   labetalol (NORMODYNE) tablet 200 mg  200 mg Oral TID [name], MD   200 mg at 07/01/19 4562   MEDLINE mouth rinse  15 mL Mouth Rinse BID [name], MD   15 mL at 07/01/19 5638   multivitamin with minerals tablet 1 tablet  1 tablet Oral  Daily [name], MD   1 tablet at 07/01/19 0921   pantoprazole sodium (PROTONIX) 40 mg/20 mL oral suspension 40 mg  40 mg Oral Daily [name], MD   40 mg at 07/01/19 9373   polyvinyl alcohol (LIQUIFILM TEARS) 1.4 % ophthalmic solution 1 drop  1 drop Right Eye QID PRN [name], MD   1 drop at 05/29/19 2209   risperiDONE (RISPERDAL) tablet 0.5 mg  0.5 mg Oral QHS [name], MD   0.5 mg at 06/30/19 2131   senna-docusate (Senokot-S) tablet 2 tablet  2 tablet Oral BID Rosezella Rumpf, NP   2 tablet at 07/01/19 4287     Discharge Medications: Please see discharge summary for a list of discharge medications.  Relevant Imaging Results:  Relevant Lab Results:   Additional Information SS#: [id number]  Geralynn Ochs, LCSW   I have personally obtained history,examined this patient, reviewed notes, independently viewed imaging studies, participated in medical decision making and plan of care.ROS completed by me personally and pertinent positives fully documented  I have made any additions or clarifications directly to the above note. Agree with note above.   Antony Contras, MD Medical Director [facility] St[facility] Center Pager: [phone] 07/01/2019 3:42 PM

## 2019-07-01 NOTE — Progress Notes (Addendum)
STROKE TEAM PROGRESS NOTE   INTERVAL HISTORY Stable neuro exam. Covid test ordered in effort to d/c pt to SNF.vitals stable. OBJECTIVE Vitals:   06/30/19 2352 07/01/19 0329 07/01/19 0814 07/01/19 1205  BP: 137/63 139/63 (!) 125/59 (!) 152/75  Pulse: 73 73 67 76  Resp: 20 18 17 18   Temp: 97.6 F (36.4 C) 98.4 F (36.9 C) 98.7 F (37.1 C) 99.2 F (37.3 C)  TempSrc: Oral  Oral Oral  SpO2: 94% 90% 96% 96%  Weight:       CBC:  Recent Labs  Lab 06/27/19 0559 07/01/19 0013  WBC 6.0 6.4  HGB 11.7* 11.4*  HCT 34.4* 33.8*  MCV 92.2 92.3  PLT 231 220   Basic Metabolic Panel:  Recent Labs  Lab 06/27/19 0559  NA 136  K 4.1  CL 102  CO2 26  GLUCOSE 106*  BUN 28*  CREATININE 1.14  CALCIUM 8.7*    IMAGING past 24h No results found.   PHYSICAL EXAM   - unchanged General - Well nourished, well developed elderly caucasian male, in no apparent distress.   Cardiovascular - Regular rhythm and rate. Neuro - awake alert, making eye contact, tracking bilaterally. Able to follow simple commands. Moderate dysarthria with paraphasic errors, but orientated to place, self, age. But not to time. Can state his whole name. Able to name objects, repeat simple sentence but moderate dysarthric voice. Paucity of speech. Blinking to visual threat bilaterally. Right facial droop.  Tongue midline.  Left upper extremity 4/5, left lower extremity at least 3/5. Right upper extremity 3/5 with mildly increased muscle tone, and right lower extremity plegic with increased muscle tone. Right triple reflex positive. Global bradykinesia and rigidity. Sensation, coordination not cooperative and gait not tested.   ASSESSMENT/PLAN Scott Gilbert is a 86 y.o. male with history of hypertension and  mild dementia (medical care at [facility]), presents to the emergency department with sudden onset of slurred speech, aphasia, left gaze preference with right-sided neglect, right hemiparesis, facial droop and weakness. He  received IV t-PA Friday 04/16/19 at 2230.  Stroke: Left ACA territory infarct, likely due to large vessel disease although cardioembolic source not excluded  VTE prophylaxis - lovenox  given significant intracranial stenosis he is on ASA 325 and plavix DAPT for 3 months (this will be on July 16, 2019)-->and then ASA alone   Therapy recommendations: SNF   Disposition:  Pending - pt without insurance coverage - court appointed guardianship papers received - SW continues to work on placement. Currently COVID testing will be done in effort to d/c in next 24-48h to SNF  Medically ready for d/c   Hypertension  On norvasc 10 mg  Labetalol 200mg  tid (increased last week)  BP 140s  goal normotensive  Hyperlipidemia  LDL 113  On lipitor 40  Dysphagia . On D2 thin liquids w/ good po intake  AKI on CKD 3a   1.14  Monitor Cre  Other Active Problems  Dementia - on galantamine and Risperdal   Hyponatremia - 136 - resolved  Hospital day # 40  Continue ongoing management.  Patient medically stable to be transferred to skilled nursing facility when bed becomes available.   Desiree Metzger-Cihelka, ARNP-C, ANVP-BC Pager: [phone]  I have personally obtained history,examined this patient, reviewed notes, independently viewed imaging studies, participated in medical decision making and plan of care.ROS completed by me personally and pertinent positives fully documented  I have made any additions or clarifications directly to the above note.  Agree with note above.    Antony Contras, MD Medical Director [facility] St[facility] Pager: [phone] 07/01/2019 2:24 PM  To contact Stroke Continuity provider, please refer to [email or web address] After hours, contact General Neurology

## 2019-07-02 ENCOUNTER — Other Ambulatory Visit: Payer: Self-pay

## 2019-07-02 DIAGNOSIS — R4701 Aphasia: Secondary | ICD-10-CM

## 2019-07-02 DIAGNOSIS — R1312 Dysphagia, oropharyngeal phase: Secondary | ICD-10-CM

## 2019-07-02 MED ORDER — ACETAMINOPHEN 325 MG PO TABS: 650.0000 mg | ORAL_TABLET | ORAL | Status: AC | PRN

## 2019-07-02 MED ORDER — POLYVINYL ALCOHOL 1.4 % OP SOLN: 1.0000 [drp] | Freq: Four times a day (QID) | OPHTHALMIC | 0 refills | Status: AC | PRN

## 2019-07-02 MED ORDER — ADULT MULTIVITAMIN W/MINERALS CH: 1.0000 | ORAL_TABLET | Freq: Every day | ORAL | 1 refills | Status: AC

## 2019-07-02 MED ORDER — SENNOSIDES-DOCUSATE SODIUM 8.6-50 MG PO TABS: 2.0000 | ORAL_TABLET | Freq: Two times a day (BID) | ORAL | 1 refills | Status: AC

## 2019-07-02 MED ORDER — PANTOPRAZOLE SODIUM 40 MG PO PACK: 40.0000 mg | PACK | Freq: Every day | ORAL | 1 refills | Status: AC

## 2019-07-02 MED ORDER — ENSURE ENLIVE PO LIQD: 237.0000 mL | Freq: Two times a day (BID) | ORAL | 12 refills | Status: AC

## 2019-07-02 MED ORDER — GALANTAMINE HYDROBROMIDE 12 MG PO TABS: 12.0000 mg | ORAL_TABLET | Freq: Every day | ORAL | 1 refills | Status: AC

## 2019-07-02 MED ORDER — ATORVASTATIN CALCIUM 40 MG PO TABS: 40.0000 mg | ORAL_TABLET | Freq: Every day | ORAL | 1 refills | Status: AC

## 2019-07-02 MED ORDER — AMLODIPINE BESYLATE 10 MG PO TABS: 10.0000 mg | ORAL_TABLET | Freq: Every day | ORAL | 1 refills | Status: AC

## 2019-07-02 MED ORDER — LABETALOL HCL 200 MG PO TABS: 200.0000 mg | ORAL_TABLET | Freq: Three times a day (TID) | ORAL | 1 refills | Status: AC

## 2019-07-02 MED ORDER — ENOXAPARIN SODIUM 30 MG/0.3ML ~~LOC~~ SOLN: 30.0000 mg | SUBCUTANEOUS | 1 refills | Status: DC

## 2019-07-02 MED ORDER — CLOPIDOGREL BISULFATE 75 MG PO TABS: 75.0000 mg | ORAL_TABLET | Freq: Every day | ORAL | 1 refills | Status: DC

## 2019-07-02 MED ORDER — ACYCLOVIR 400 MG PO TABS: 400.0000 mg | ORAL_TABLET | Freq: Two times a day (BID) | ORAL | 1 refills | Status: AC

## 2019-07-02 MED ORDER — RISPERIDONE 0.5 MG PO TABS: 0.5000 mg | ORAL_TABLET | Freq: Every day | ORAL | 1 refills | Status: AC

## 2019-07-02 MED ORDER — STROKE: EARLY STAGES OF RECOVERY BOOK: 1.0000 | Freq: Once | Status: AC

## 2019-07-02 MED ORDER — ASPIRIN 81 MG PO CHEW: 81.0000 mg | CHEWABLE_TABLET | Freq: Every day | ORAL | Status: AC

## 2019-07-02 NOTE — TOC Transition Note (Signed)
Transition of Care [facility]) - CM/SW Discharge Note   Patient Details  Name: Scott Gilbert MRN: [id number] Date of Birth: 1929-09-09  Transition of Care [facility]) CM/SW Contact:  Baldemar Lenis, LCSW Phone Number: 07/02/2019, 5:15 PM   Clinical Narrative:   Nurse to call report to [phone], [address]. Ask for Jocelyn.    Final next level of care: Skilled Nursing Facility Barriers to Discharge: Barriers Resolved   Patient Goals and CMS Choice Patient states their goals for this hospitalization and ongoing recovery are:: Pt unable to participate in goal setting due to disorientation. CMS Medicare.gov Compare Post Acute Care list provided to:: Patient Represenative (must comment)(Brenda, daughter) Choice offered to / list presented to : Adult Children  Discharge Placement              Patient chooses bed at: [facility]) Patient to be transferred to facility by: [facility] Name of family member notified: Valarie Cones Patient and family notified of of transfer: 07/02/19  Discharge Plan and Services                                     Social Determinants of Health (SDOH) Interventions     Readmission Risk Interventions No flowsheet data found.

## 2019-07-02 NOTE — Plan of Care (Signed)
  Problem: Nutrition: Goal: Adequate nutrition will be maintained Outcome: Progressing   Problem: Pain Managment: Goal: General experience of comfort will improve 07/02/2019 1038 by Lolly Mustache, RN Outcome: Progressing

## 2019-07-02 NOTE — Progress Notes (Signed)
Pt discharge education completed. Pt discharge to [facility] and report called off to nurse Kennyth Lose at the facility. VSS; pt alert and waiting on [facility] to come transport off to disposition. Pt personal belongings including wallet with $3.50 picked up from security and given to pt son-in law by RN Marylene Land. Reported off to oncoming till pt pick up by [facility]. Dionne Bucy RN

## 2019-07-02 NOTE — Progress Notes (Signed)
Physical Therapy Treatment Patient Details Name: Scott Gilbert MRN: [id number] DOB: Feb 05, 1930 Today's Date: 07/02/2019    History of Present Illness 86 y.o. male with past medical history significant for hypertension and mild dementia. He presented to the emergency department with sudden onset of slurred speech and aphasia, facial droop and weakness.   CT cerebral perfusion on 04/16/19 reported: "acute distal Left ACA territory ischemia and infarct."    PT Comments    Patient alert, received in bed, agrees to PT session. He is mostly non-verbal, laughs as response. Patient requires mod assist for supine to sit. Cues for safety when sitting edge of bed. He requires mod +2 for sit to stand transfer and assist to place right UE on platform of walker due to increased tone. Patient is able to ambulate a total of 12 feet with max +2 assist to control walker direction and for safety with chair to follow. Patient will continue to benefit from skilled PT to improve functional independence and safety with mobility.        Follow Up Recommendations  SNF;Supervision/Assistance - 24 hour     Equipment Recommendations  Wheelchair (measurements PT);Wheelchair cushion (measurements PT)    Recommendations for Other Services       Precautions / Restrictions Precautions Precautions: Fall Precaution Comments: short length KI Required Braces or Orthoses: Knee Immobilizer - Right Knee Immobilizer - Right: On when out of bed or walking Other Brace: R UE resting hand splint in room for use at night Restrictions Weight Bearing Restrictions: No    Mobility  Bed Mobility Overal bed mobility: Needs Assistance Bed Mobility: Supine to Sit     Supine to sit: Mod assist        Transfers Overall transfer level: Needs assistance Equipment used: (EVA walker) Transfers: Sit to/from Stand Sit to Stand: Mod assist;+2 safety/equipment;+2 physical assistance         General transfer comment: Assist  needed to get R UE up onto platform due to increased tone.  Ambulation/Gait Ambulation/Gait assistance: Max assist;+2 physical assistance;+2 safety/equipment Gait Distance (Feet): 12 Feet Assistive device: Rolling walker (2 wheeled)(Eva walker kept locked) Gait Pattern/deviations: Step-to pattern;Decreased step length - right;Decreased step length - left;Trunk flexed;Drifts right/left Gait velocity: decreased   General Gait Details: Patient only has toes of right foot on floor, keeps right knee flexed despite use of KI. Basically drags the right LE on toes. Veers/pushes to right with walker requiring max assist to assist with control and direction of walker.   Stairs             Wheelchair Mobility    Modified Rankin (Stroke Patients Only) Modified Rankin (Stroke Patients Only) Pre-Morbid Rankin Score: No symptoms Modified Rankin: Moderately severe disability     Balance Overall balance assessment: Needs assistance Sitting-balance support: Feet supported Sitting balance-Leahy Scale: Fair Sitting balance - Comments: Supervision for safety intermittently switching which hand was supporting on bed   Standing balance support: Bilateral upper extremity supported;During functional activity Standing balance-Leahy Scale: Poor Standing balance comment: UE support and significant assist for balance                            Cognition Arousal/Alertness: Awake/alert Behavior During Therapy: WFL for tasks assessed/performed;Impulsive(slightly impulsive) Overall Cognitive Status: Impaired/Different from baseline Area of Impairment: Following commands;Safety/judgement;Problem solving                       Following Commands:  Follows one step commands with increased time;Follows one step commands consistently Safety/Judgement: Decreased awareness of safety;Decreased awareness of deficits Awareness: Intellectual Problem Solving: Requires verbal cues;Requires  tactile cues;Difficulty sequencing General Comments: increased time and cueing to follow simple commands, often laughs at all requests.      Exercises Total Joint Exercises Ankle Circles/Pumps: PROM;10 reps;Right Other Exercises Other Exercises: Passive stretching of right knee into extension to prepare for KI to be donned.    General Comments        Pertinent Vitals/Pain Pain Assessment: Faces Pain Score: 5  Faces Pain Scale: Hurts little more Pain Location: R LE with stretching into extension Pain Descriptors / Indicators: Grimacing;Guarding Pain Intervention(s): Monitored during session;Repositioned    Home Living                      Prior Function            PT Goals (current goals can now be found in the care plan section) Acute Rehab PT Goals Patient Stated Goal: none stated PT Goal Formulation: Patient unable to participate in goal setting Time For Goal Achievement: 07/16/19 Potential to Achieve Goals: Fair Progress towards PT goals: Progressing toward goals    Frequency           PT Plan Current plan remains appropriate    Co-evaluation              AM-PAC PT "6 Clicks" Mobility   Outcome Measure  Help needed turning from your back to your side while in a flat bed without using bedrails?: A Lot Help needed moving from lying on your back to sitting on the side of a flat bed without using bedrails?: A Lot Help needed moving to and from a bed to a chair (including a wheelchair)?: A Lot Help needed standing up from a chair using your arms (e.g., wheelchair or bedside chair)?: A Lot Help needed to walk in hospital room?: A Lot Help needed climbing 3-5 steps with a railing? : Total 6 Click Score: 11    End of Session Equipment Utilized During Treatment: Gait belt Activity Tolerance: Patient tolerated treatment well Patient left: in chair;with call bell/phone within reach;with chair alarm set Nurse Communication: Mobility status PT  Visit Diagnosis: Other abnormalities of gait and mobility (R26.89);Hemiplegia and hemiparesis;Muscle weakness (generalized) (M62.81) Hemiplegia - Right/Left: Right Hemiplegia - dominant/non-dominant: Dominant Hemiplegia - caused by: Cerebral infarction     Time: 3154-0086 PT Time Calculation (min) (ACUTE ONLY): 23 min  Charges:  $Gait Training: 8-22 mins $Therapeutic Exercise: 8-22 mins                     Alexandru Moorer, PT, GCS 07/02/19,12:10 PM

## 2019-07-02 NOTE — Progress Notes (Addendum)
STROKE TEAM PROGRESS NOTE   INTERVAL HISTORY Sitting up in chair, attentive with staff.  Covid test negative. D/w case mgt as efforts underway for placement in SNF. OBJECTIVE Vitals:   07/02/19 0057 07/02/19 0440 07/02/19 0804 07/02/19 1133  BP: (!) 131/51 (!) 149/68 135/67 134/61  Pulse: 76 69 66 68  Resp: 16 16 14 20   Temp: 98.5 F (36.9 C) 98.4 F (36.9 C) 98.6 F (37 C) 98.5 F (36.9 C)  TempSrc: Oral Oral Axillary Oral  SpO2: 94% 94% 96% 96%  Weight:       CBC:  Recent Labs  Lab 06/27/19 0559 07/01/19 0013  WBC 6.0 6.4  HGB 11.7* 11.4*  HCT 34.4* 33.8*  MCV 92.2 92.3  PLT 231 973   Basic Metabolic Panel:  Recent Labs  Lab 06/27/19 0559  NA 136  K 4.1  CL 102  CO2 26  GLUCOSE 106*  BUN 28*  CREATININE 1.14  CALCIUM 8.7*    IMAGING past 24h No results found.   PHYSICAL EXAM   - unchanged General - Well nourished, well developed elderly caucasian male, in no apparent distress.   Cardiovascular - Regular rhythm and rate. Neuro - awake alert, making eye contact, tracking bilaterally and attends well with staff at bedside. Able to follow simple commands. Moderate dysarthria with paraphasic errors, but orientated to place, self, age. But not to time. Can state his whole name. Able to name objects, repeat simple sentence but moderate dysarthric voice. Paucity of speech. Blinking to visual threat bilaterally. Right facial droop.  Tongue midline.  Left upper extremity 4/5, left lower extremity at least 3/5. Right upper extremity 3/5 with mildly increased muscle tone, and right lower extremity plegic with increased muscle tone. Right triple reflex positive. Global bradykinesia and rigidity. Sensation, coordination not cooperative and gait not tested.   ASSESSMENT/PLAN [name] is a 86 y.o. male with history of hypertension and  mild dementia (medical care at [facility]), presents to the emergency department with sudden onset of slurred speech, aphasia, left gaze  preference with right-sided neglect, right hemiparesis, facial droop and weakness. He received IV t-PA Friday 04/16/19 at 2230.  Stroke: Left ACA territory infarct, likely due to large vessel disease although cardioembolic source not excluded  VTE prophylaxis - lovenox  given significant intracranial stenosis he is on ASA 325 and plavix DAPT for 3 months (this will be on July 16, 2019)-->and then ASA alone   Therapy recommendations: SNF   Disposition:  Pending - pt without insurance coverage - court appointed guardianship papers received - SW continues to work on placement. Expect d/c in next 24-48h to SNF. COVID test neg.  Medically ready for d/c   Hypertension  On norvasc 10 mg  Labetalol 200mg  tid (increased last week)  BP 140s  goal normotensive  Hyperlipidemia  LDL 113  On lipitor 40  Dysphagia . On D2 thin liquids w/ good po intake  AKI on CKD 3a   1.14  Monitor Cre  Other Active Problems  Dementia - on galantamine and Risperdal   Hyponatremia - 136 - resolved  Hospital day # 68  Continue ongoing management.  Patient medically stable to be transferred to skilled nursing facility when bed becomes available.   Desiree Metzger-Cihelka, ARNP-C, ANVP-BC Pager: [phone] I have personally obtained history,examined this patient, reviewed notes, independently viewed imaging studies, participated in medical decision making and plan of care.ROS completed by me personally and pertinent positives fully documented  I have made any  additions or clarifications directly to the above note. Agree with note above.  Patient is medically stable to be transferred to skilled nursing facility later today  [name], MD Medical Director [facility] Pager: [phone] 07/02/2019 4:49 PM  To contact Stroke Continuity provider, please refer to [email or web address]. After hours, contact General Neurology

## 2019-07-02 NOTE — Progress Notes (Signed)
Pt discharged, still waiting on transport ([facility]), resting quietly in bed, will continue to monitor. Scott Gilbert, [name]

## 2019-07-03 NOTE — Progress Notes (Signed)
[facility] arrived and picked up pt to the nursing home at 2355, pt reassured. Obasogie-Asidi, Jilliane Kazanjian Efe

## 2019-08-18 ENCOUNTER — Ambulatory Visit (INDEPENDENT_AMBULATORY_CARE_PROVIDER_SITE_OTHER): Payer: Non-veteran care | Admitting: Neurology

## 2019-08-18 ENCOUNTER — Encounter: Payer: Self-pay | Admitting: Neurology

## 2019-08-18 ENCOUNTER — Other Ambulatory Visit: Payer: Self-pay

## 2019-08-18 VITALS — BP 111/57

## 2019-08-18 DIAGNOSIS — F0150 Vascular dementia without behavioral disturbance: Secondary | ICD-10-CM

## 2019-08-18 DIAGNOSIS — I63522 Cerebral infarction due to unspecified occlusion or stenosis of left anterior cerebral artery: Secondary | ICD-10-CM

## 2019-08-18 DIAGNOSIS — G8110 Spastic hemiplegia affecting unspecified side: Secondary | ICD-10-CM | POA: Diagnosis not present

## 2019-08-18 NOTE — Patient Instructions (Addendum)
I had a long discussion with the patient and his daughter regarding his recent left anterior cerebral artery infarct and baseline dementia with no spastic right hemiplegia.  I recommend continue ongoing physical and occupational therapy and aspirin  for stroke prevention and stop Plavix as it has been more than 3 months of dual antiplatelet therapy for stroke prevention and maintain aggressive risk factor modification with strict control of hypertension with blood pressure goal below 130/90, lipids with LDL cholesterol goal below 70 mg percent and diabetes with hemoglobin A1c goal below 6.5%.  Continue Gallantamine   12 mg daily at bedtime for his dementia.  Continue Risperdal at bedtime for sleep and agitation.  No scheduled routine follow-up appointment is necessary but he may be referred back in the future as needed.  Stroke Prevention Some medical conditions and behaviors are associated with a higher chance of having a stroke. You can help prevent a stroke by making nutrition, lifestyle, and other changes, including managing any medical conditions you may have. What nutrition changes can be made?   Eat healthy foods. You can do this by: ? Choosing foods high in fiber, such as fresh fruits and vegetables and whole grains. ? Eating at least 5 or more servings of fruits and vegetables a day. Try to fill half of your plate at each meal with fruits and vegetables. ? Choosing lean protein foods, such as lean cuts of meat, poultry without skin, fish, tofu, beans, and nuts. ? Eating low-fat dairy products. ? Avoiding foods that are high in salt (sodium). This can help lower blood pressure. ? Avoiding foods that have saturated fat, trans fat, and cholesterol. This can help prevent high cholesterol. ? Avoiding processed and premade foods.  Follow your health care provider's specific guidelines for losing weight, controlling high blood pressure (hypertension), lowering high cholesterol, and managing  diabetes. These may include: ? Reducing your daily calorie intake. ? Limiting your daily sodium intake to 1,500 milligrams (mg). ? Using only healthy fats for cooking, such as olive oil, canola oil, or sunflower oil. ? Counting your daily carbohydrate intake. What lifestyle changes can be made?  Maintain a healthy weight. Talk to your health care provider about your ideal weight.  Get at least 30 minutes of moderate physical activity at least 5 days a week. Moderate activity includes brisk walking, biking, and swimming.  Do not use any products that contain nicotine or tobacco, such as cigarettes and e-cigarettes. If you need help quitting, ask your health care provider. It may also be helpful to avoid exposure to secondhand smoke.  Limit alcohol intake to no more than 1 drink a day for nonpregnant women and 2 drinks a day for men. One drink equals 12 oz of beer, 5 oz of wine, or 1 oz of hard liquor.  Stop any illegal drug use.  Avoid taking birth control pills. Talk to your health care provider about the risks of taking birth control pills if: ? You are over 66 years old. ? You smoke. ? You get migraines. ? You have ever had a blood clot. What other changes can be made?  Manage your cholesterol levels. ? Eating a healthy diet is important for preventing high cholesterol. If cholesterol cannot be managed through diet alone, you may also need to take medicines. ? Take any prescribed medicines to control your cholesterol as told by your health care provider.  Manage your diabetes. ? Eating a healthy diet and exercising regularly are important parts of managing your  blood sugar. If your blood sugar cannot be managed through diet and exercise, you may need to take medicines. ? Take any prescribed medicines to control your diabetes as told by your health care provider.  Control your hypertension. ? To reduce your risk of stroke, try to keep your blood pressure below 130/80. ? Eating a  healthy diet and exercising regularly are an important part of controlling your blood pressure. If your blood pressure cannot be managed through diet and exercise, you may need to take medicines. ? Take any prescribed medicines to control hypertension as told by your health care provider. ? Ask your health care provider if you should monitor your blood pressure at home. ? Have your blood pressure checked every year, even if your blood pressure is normal. Blood pressure increases with age and some medical conditions.  Get evaluated for sleep disorders (sleep apnea). Talk to your health care provider about getting a sleep evaluation if you snore a lot or have excessive sleepiness.  Take over-the-counter and prescription medicines only as told by your health care provider. Aspirin or blood thinners (antiplatelets or anticoagulants) may be recommended to reduce your risk of forming blood clots that can lead to stroke.  Make sure that any other medical conditions you have, such as atrial fibrillation or atherosclerosis, are managed. What are the warning signs of a stroke? The warning signs of a stroke can be easily remembered as BEFAST.  B is for balance. Signs include: ? Dizziness. ? Loss of balance or coordination. ? Sudden trouble walking.  E is for eyes. Signs include: ? A sudden change in vision. ? Trouble seeing.  F is for face. Signs include: ? Sudden weakness or numbness of the face. ? The face or eyelid drooping to one side.  A is for arms. Signs include: ? Sudden weakness or numbness of the arm, usually on one side of the body.  S is for speech. Signs include: ? Trouble speaking (aphasia). ? Trouble understanding.  T is for time. ? These symptoms may represent a serious problem that is an emergency. Do not wait to see if the symptoms will go away. Get medical help right away. Call your local emergency services (911 in the [place] Do not drive yourself to the hospital.  Other  signs of stroke may include: ? A sudden, severe headache with no known cause. ? Nausea or vomiting. ? Seizure. Where to find more information For more information, visit:  American Stroke Association: www.strokeassociation.org  National Stroke Association: www.stroke.org Summary  You can prevent a stroke by eating healthy, exercising, not smoking, limiting alcohol intake, and managing any medical conditions you may have.  Do not use any products that contain nicotine or tobacco, such as cigarettes and e-cigarettes. If you need help quitting, ask your health care provider. It may also be helpful to avoid exposure to secondhand smoke.  Remember BEFAST for warning signs of stroke. Get help right away if you or a loved one has any of these signs. This information is not intended to replace advice given to you by your health care provider. Make sure you discuss any questions you have with your health care provider. Document Revised: 02/14/2017 Document Reviewed: 04/09/2016 Elsevier Patient Education  2020 [employer].

## 2019-08-18 NOTE — Progress Notes (Signed)
[facility] [address] [place]. [place] [address] [phone]       OFFICE FOLLOW-UP NOTE  Scott Gilbert Date of Birth:  [birth year] Medical Record Number:  [id number]   HPI: Scott Gilbert  is a 86 year old Caucasian male seen today for initial office follow-up visit following hospital admission for stroke in January 2021.  History is obtained from the patient and review of electronic medical records as well as discussion with his daughter was present for this visit.  I personally reviewed imaging films in PACS. Scott Gilbert is a 86 year old male with past medical history of hypertension and mild dementia who presented to [facility] on 04/16/2019 with sudden onset of slurred speech aphasia left gaze preference and right-sided neglect, right hemiparesis facial droop.  He received IV TPA.  CT scan of the head showed no acute abnormality but old bilateral basal ganglia infarcts.  MRI scan showed moderate-sized acuteNonhemorrhagic infarct involving left ACA territory.  CT angiogram showed severe distal left ICA siphon disease with multifocal stenosis involving left MCA as well as severe left ACA A2 segment stenosis and left PCA and bilateral MCA M2 segment stenosis.  EEG showed left frontotemporal cortical dysfunction but no definite seizures.  2D echo showed normal ejection fraction without cardiac source of embolism.  Lower extremity venous Dopplers were negative for DVT.  LDL cholesterol was 113 mg percent.  Hemoglobin A1c was 5.3.  Urine drug screen was negative.  He was on no antithrombotic prior to admission he was switched to aspirin and Plavix for 3 months given significant intracranial stenosis.  Patient will was ready for transfer to skilled nursing facility for several months but had a prolonged hospital stay because of difficulty in finding nursing home bed for placement.  He had dysphagia during hospitalization initially was fed with tube feeds subsequently cleared  to dysphagia diet.  He had mild renal insufficiency which gradually improved.  He had baseline dementia for which he was on galantamine and Risperdal.  His neurological exam was significant for patient being awake but having's moderate dysarthria with nonfluent speech and a very soft voice with paucity of speech.  He had right facial droop and right hemiparesis with right upper extremity 3/5 right lower extremity 4/5 strength.  He was transferred to skilled nursing facility.  He returns today for follow-up accompanied by his guardian and caregiver.  He does not appear to have made much improvement.  He still bedridden.  He is lying in the stretcher.  His voice is slightly improved he is able to communicate but due to his baseline dementia his speech is mostly out of context.  He can answer simple sounds like his name is oriented to place but not to time or person.  He still has right hemiparesis with 3 out of 5 right upper and lower extremity strength with purposeful movements on the left.  His tone appears to be increased on the right.  He is not ambulating even with therapy and is total care. ROS:   14 system review of systems is positive for memory loss, speech difficulty, weakness, gait difficulty and all other systems negative  PMH:  Past Medical History:  Diagnosis Date  . Hypertension   . Mild dementia ([place])     Social History:  Social History   Socioeconomic History  . Marital status: Single    Spouse name: Not on file  . Number of children: Not on file  . Years of education: Not on file  .  Highest education level: Not on file  Occupational History  . Not on file  Tobacco Use  . Smoking status: Never Smoker  . Smokeless tobacco: Never Used  Substance and Sexual Activity  . Alcohol use: Not on file  . Drug use: Not on file  . Sexual activity: Not on file  Other Topics Concern  . Not on file  Social History Narrative  . Not on file   Social Determinants of Health   Financial  Resource Strain:   . Difficulty of Paying Living Expenses:   Food Insecurity:   . Worried About Programme researcher, broadcasting/film/video in the Last Year:   . Barista in the Last Year:   Transportation Needs:   . Freight forwarder (Medical):   Marland Kitchen Lack of Transportation (Non-Medical):   Physical Activity:   . Days of Exercise per Week:   . Minutes of Exercise per Session:   Stress:   . Feeling of Stress :   Social Connections:   . Frequency of Communication with Friends and Family:   . Frequency of Social Gatherings with Friends and Family:   . Attends Religious Services:   . Active Member of Clubs or Organizations:   . Attends Banker Meetings:   Marland Kitchen Marital Status:   Intimate Partner Violence:   . Fear of Current or Ex-Partner:   . Emotionally Abused:   Marland Kitchen Physically Abused:   . Sexually Abused:     Medications:   Current Outpatient Medications on File Prior to Visit  Medication Sig Dispense Refill  . acetaminophen (TYLENOL) 325 MG tablet Take 2 tablets (650 mg total) by mouth every 4 (four) hours as needed for mild pain (or temp > 37.5 C (99.5 F)).    Marland Kitchen acyclovir (ZOVIRAX) 400 MG tablet Take 1 tablet (400 mg total) by mouth 2 (two) times daily. 30 tablet 1  . amLODipine (NORVASC) 10 MG tablet Take 1 tablet (10 mg total) by mouth daily. 31 tablet 1  . aspirin 81 MG chewable tablet Chew 1 tablet (81 mg total) by mouth daily.    Marland Kitchen atorvastatin (LIPITOR) 40 MG tablet Take 1 tablet (40 mg total) by mouth daily at 6 PM. 31 tablet 1  . feeding supplement, ENSURE ENLIVE, (ENSURE ENLIVE) LIQD Take 237 mLs by mouth 2 (two) times daily between meals. 237 mL 12  . galantamine (RAZADYNE) 12 MG tablet Take 1 tablet (12 mg total) by mouth at bedtime. 31 tablet 1  . labetalol (NORMODYNE) 200 MG tablet Take 1 tablet (200 mg total) by mouth 3 (three) times daily. 31 tablet 1  . Multiple Vitamin (MULTIVITAMIN WITH MINERALS) TABS tablet Take 1 tablet by mouth daily. 31 tablet 1  . pantoprazole  sodium (PROTONIX) 40 mg/20 mL PACK Take 20 mLs (40 mg total) by mouth daily. 30 mL 1  . polyvinyl alcohol (LIQUIFILM TEARS) 1.4 % ophthalmic solution Place 1 drop into the right eye 4 (four) times daily as needed (dry eye). 15 mL 0  . risperiDONE (RISPERDAL) 0.5 MG tablet Take 1 tablet (0.5 mg total) by mouth at bedtime. 31 tablet 1  . senna-docusate (SENOKOT-S) 8.6-50 MG tablet Take 2 tablets by mouth 2 (two) times daily. 31 tablet 1   No current facility-administered medications on file prior to visit.    Allergies:  No Known Allergies  Physical Exam General: well developed, well nourished elderly Caucasian male, lying on a stretcher.  In no evident distress Head: head normocephalic and atraumatic.  Neck: supple with no carotid or supraclavicular bruits Cardiovascular: regular rate and rhythm, no murmurs Musculoskeletal: no deformity Skin:  no rash/petichiae Vascular:  Normal pulses all extremities There were no vitals filed for this visit. Neurologic Exam Mental Status: Awake and fully alert. Oriented to place and time. Recent and remote memory poor. Attention span, concentration and fund of knowledge diminished. Mood and affect appropriate.  Mild dysarthria with severe hypophonic speech at times difficult to understand.  Bradycardia for any up.  Follows simple midline and one-step commands only. Cranial Nerves: Fundoscopic exam not done. Pupils equal, briskly reactive to light. Extraocular movements full without nystagmus. Visual fields full to confrontation. Hearing diminished bilaterally facial sensation intact. Face, tongue, palate moves normally and symmetrically.  Motor: Spastic right hemiplegia with right upper extremity strength 3 out of 5 with distal greater than proximal weakness.  Right lower extremity strength is 2 out of 5.  Tone is increased on the right with mild spasticity.   Sensory.: intact to touch ,pinprick .position and vibratory sensation.  Coordination: Rapid  alternating movements diminished on the right. Finger-to-nose and heel-to-shin performed inaccurately on the right.   Gait and Station: Deferred as patient is maximum assist and unable to walk even with therapist.   Reflexes: 1+ and symmetric. Toes downgoing.   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  Vascular risk factors of hypertension, hyperlipidemia and atherosclerotic disease     PLAN: I had a long discussion with the patient and his daughter regarding his recent left anterior cerebral artery infarct and baseline dementia with no spastic right hemiplegia.  I recommend continue ongoing physical and occupational therapy and aspirin  for stroke prevention and stop Plavix as it has been more than 3 months of dual antiplatelet therapy for stroke prevention and maintain aggressive risk factor modification with strict control of hypertension with blood pressure goal below 130/90, lipids with LDL cholesterol goal below 70 mg percent and diabetes with hemoglobin A1c goal below 6.5%.  Continue Gallantamine   12 mg daily at bedtime for his dementia.  Continue Risperdal at bedtime for sleep and agitation.  No scheduled routine follow-up appointment is necessary but he may be referred back in the future as needed. Greater than 50% of time during this 30 minute visit was spent on counseling,explanation of diagnosis, planning of further management, discussion with patient and family and coordination of care [name], MD  [facility] [address] [place], [name] [address]  Phone [phone] Fax [phone] Note: This document was prepared with digital dictation and possible smart phrase  technology. Any transcriptional errors that result from this process are unintentional

## 2019-08-19 ENCOUNTER — Encounter: Payer: Self-pay | Admitting: Neurology

## 2019-10-12 ENCOUNTER — Other Ambulatory Visit: Payer: Self-pay

## 2019-10-12 NOTE — Patient Outreach (Signed)
[facility]) Care Management  10/12/2019  Scott Gilbert March 07, 1930 [id number]  Telephone outreach to patient's caregiver to obtain mRS was successfully completed. MRS=5  Baruch Gouty [facility] Management Assistant [phone]

## 2019-12-02 ENCOUNTER — Inpatient Hospital Stay (HOSPITAL_COMMUNITY)
Admission: EM | Admit: 2019-12-02 | Discharge: 2019-12-17 | DRG: 871 | Disposition: E | Payer: No Typology Code available for payment source | Source: Skilled Nursing Facility | Attending: Internal Medicine | Admitting: Internal Medicine

## 2019-12-02 ENCOUNTER — Emergency Department (HOSPITAL_COMMUNITY): Payer: No Typology Code available for payment source

## 2019-12-02 DIAGNOSIS — E870 Hyperosmolality and hypernatremia: Secondary | ICD-10-CM | POA: Diagnosis present

## 2019-12-02 DIAGNOSIS — I69320 Aphasia following cerebral infarction: Secondary | ICD-10-CM | POA: Diagnosis not present

## 2019-12-02 DIAGNOSIS — U071 COVID-19: Secondary | ICD-10-CM | POA: Diagnosis present

## 2019-12-02 DIAGNOSIS — R0902 Hypoxemia: Secondary | ICD-10-CM

## 2019-12-02 DIAGNOSIS — K828 Other specified diseases of gallbladder: Secondary | ICD-10-CM | POA: Diagnosis present

## 2019-12-02 DIAGNOSIS — I2119 ST elevation (STEMI) myocardial infarction involving other coronary artery of inferior wall: Secondary | ICD-10-CM | POA: Diagnosis not present

## 2019-12-02 DIAGNOSIS — I219 Acute myocardial infarction, unspecified: Secondary | ICD-10-CM | POA: Diagnosis not present

## 2019-12-02 DIAGNOSIS — I69351 Hemiplegia and hemiparesis following cerebral infarction affecting right dominant side: Secondary | ICD-10-CM | POA: Diagnosis not present

## 2019-12-02 DIAGNOSIS — J1282 Pneumonia due to coronavirus disease 2019: Secondary | ICD-10-CM | POA: Diagnosis not present

## 2019-12-02 DIAGNOSIS — N179 Acute kidney failure, unspecified: Secondary | ICD-10-CM | POA: Diagnosis not present

## 2019-12-02 DIAGNOSIS — L89151 Pressure ulcer of sacral region, stage 1: Secondary | ICD-10-CM | POA: Diagnosis present

## 2019-12-02 DIAGNOSIS — R1312 Dysphagia, oropharyngeal phase: Secondary | ICD-10-CM | POA: Diagnosis present

## 2019-12-02 DIAGNOSIS — Z7982 Long term (current) use of aspirin: Secondary | ICD-10-CM

## 2019-12-02 DIAGNOSIS — R7989 Other specified abnormal findings of blood chemistry: Secondary | ICD-10-CM

## 2019-12-02 DIAGNOSIS — I6930 Unspecified sequelae of cerebral infarction: Secondary | ICD-10-CM | POA: Diagnosis not present

## 2019-12-02 DIAGNOSIS — F0390 Unspecified dementia without behavioral disturbance: Secondary | ICD-10-CM | POA: Diagnosis not present

## 2019-12-02 DIAGNOSIS — Z7722 Contact with and (suspected) exposure to environmental tobacco smoke (acute) (chronic): Secondary | ICD-10-CM | POA: Diagnosis present

## 2019-12-02 DIAGNOSIS — Z720 Tobacco use: Secondary | ICD-10-CM | POA: Diagnosis not present

## 2019-12-02 DIAGNOSIS — L8990 Pressure ulcer of unspecified site, unspecified stage: Secondary | ICD-10-CM | POA: Diagnosis present

## 2019-12-02 DIAGNOSIS — J159 Unspecified bacterial pneumonia: Secondary | ICD-10-CM | POA: Diagnosis present

## 2019-12-02 DIAGNOSIS — I10 Essential (primary) hypertension: Secondary | ICD-10-CM | POA: Diagnosis present

## 2019-12-02 DIAGNOSIS — I213 ST elevation (STEMI) myocardial infarction of unspecified site: Secondary | ICD-10-CM | POA: Diagnosis present

## 2019-12-02 DIAGNOSIS — A419 Sepsis, unspecified organism: Principal | ICD-10-CM | POA: Diagnosis present

## 2019-12-02 DIAGNOSIS — G9349 Other encephalopathy: Secondary | ICD-10-CM | POA: Diagnosis not present

## 2019-12-02 DIAGNOSIS — R7401 Elevation of levels of liver transaminase levels: Secondary | ICD-10-CM | POA: Diagnosis not present

## 2019-12-02 DIAGNOSIS — F200 Paranoid schizophrenia: Secondary | ICD-10-CM | POA: Diagnosis present

## 2019-12-02 DIAGNOSIS — Z66 Do not resuscitate: Secondary | ICD-10-CM | POA: Diagnosis present

## 2019-12-02 DIAGNOSIS — I2510 Atherosclerotic heart disease of native coronary artery without angina pectoris: Secondary | ICD-10-CM | POA: Diagnosis present

## 2019-12-02 DIAGNOSIS — Z79899 Other long term (current) drug therapy: Secondary | ICD-10-CM

## 2019-12-02 DIAGNOSIS — J9601 Acute respiratory failure with hypoxia: Secondary | ICD-10-CM | POA: Diagnosis present

## 2019-12-02 DIAGNOSIS — K760 Fatty (change of) liver, not elsewhere classified: Secondary | ICD-10-CM | POA: Diagnosis present

## 2019-12-02 DIAGNOSIS — Z7401 Bed confinement status: Secondary | ICD-10-CM

## 2019-12-02 DIAGNOSIS — I2699 Other pulmonary embolism without acute cor pulmonale: Secondary | ICD-10-CM

## 2019-12-02 DIAGNOSIS — I69391 Dysphagia following cerebral infarction: Secondary | ICD-10-CM

## 2019-12-02 DIAGNOSIS — E861 Hypovolemia: Secondary | ICD-10-CM | POA: Diagnosis present

## 2019-12-02 DIAGNOSIS — Z515 Encounter for palliative care: Secondary | ICD-10-CM | POA: Diagnosis not present

## 2019-12-02 DIAGNOSIS — K219 Gastro-esophageal reflux disease without esophagitis: Secondary | ICD-10-CM | POA: Diagnosis present

## 2019-12-02 DIAGNOSIS — E878 Other disorders of electrolyte and fluid balance, not elsewhere classified: Secondary | ICD-10-CM | POA: Diagnosis not present

## 2019-12-02 DIAGNOSIS — A4189 Other specified sepsis: Secondary | ICD-10-CM | POA: Diagnosis not present

## 2019-12-02 LAB — COMPREHENSIVE METABOLIC PANEL
ALT: 1113 U/L — ABNORMAL HIGH (ref 0–44)
AST: 1778 U/L — ABNORMAL HIGH (ref 15–41)
Albumin: 2.5 g/dL — ABNORMAL LOW (ref 3.5–5.0)
Alkaline Phosphatase: 102 U/L (ref 38–126)
Anion gap: 17 — ABNORMAL HIGH (ref 5–15)
BUN: 100 mg/dL — ABNORMAL HIGH (ref 8–23)
CO2: 19 mmol/L — ABNORMAL LOW (ref 22–32)
Calcium: 8.5 mg/dL — ABNORMAL LOW (ref 8.9–10.3)
Chloride: 108 mmol/L (ref 98–111)
Creatinine, Ser: 3.21 mg/dL — ABNORMAL HIGH (ref 0.61–1.24)
GFR calc Af Amer: 19 mL/min — ABNORMAL LOW (ref >60)
GFR calc non Af Amer: 16 mL/min — ABNORMAL LOW (ref >60)
Glucose, Bld: 151 mg/dL — ABNORMAL HIGH (ref 70–99)
Potassium: 4.1 mmol/L (ref 3.5–5.1)
Sodium: 144 mmol/L (ref 135–145)
Total Bilirubin: 1.2 mg/dL (ref 0.3–1.2)
Total Protein: 6.2 g/dL — ABNORMAL LOW (ref 6.5–8.1)

## 2019-12-02 LAB — CBC WITH DIFFERENTIAL/PLATELET
Abs Immature Granulocytes: 0.15 10*3/uL — ABNORMAL HIGH (ref 0.00–0.07)
Basophils Absolute: 0 10*3/uL (ref 0.0–0.1)
Basophils Relative: 0 %
Eosinophils Absolute: 0 10*3/uL (ref 0.0–0.5)
Eosinophils Relative: 0 %
HCT: 36.9 % — ABNORMAL LOW (ref 39.0–52.0)
Hemoglobin: 12.1 g/dL — ABNORMAL LOW (ref 13.0–17.0)
Immature Granulocytes: 1 %
Lymphocytes Relative: 3 %
Lymphs Abs: 0.5 10*3/uL — ABNORMAL LOW (ref 0.7–4.0)
MCH: 31.3 pg (ref 26.0–34.0)
MCHC: 32.8 g/dL (ref 30.0–36.0)
MCV: 95.6 fL (ref 80.0–100.0)
Monocytes Absolute: 0.4 10*3/uL (ref 0.1–1.0)
Monocytes Relative: 2 %
Neutro Abs: 17.8 10*3/uL — ABNORMAL HIGH (ref 1.7–7.7)
Neutrophils Relative %: 94 %
Platelets: 331 10*3/uL (ref 150–400)
RBC: 3.86 MIL/uL — ABNORMAL LOW (ref 4.22–5.81)
RDW: 13.3 % (ref 11.5–15.5)
WBC: 18.9 10*3/uL — ABNORMAL HIGH (ref 4.0–10.5)
nRBC: 0 % (ref 0.0–0.2)

## 2019-12-02 LAB — LIPASE, BLOOD: Lipase: 33 U/L (ref 11–51)

## 2019-12-02 LAB — LACTATE DEHYDROGENASE: LDH: 2038 U/L — ABNORMAL HIGH (ref 98–192)

## 2019-12-02 LAB — PROCALCITONIN: Procalcitonin: 2.63 ng/mL

## 2019-12-02 LAB — D-DIMER, QUANTITATIVE: D-Dimer, Quant: 5.03 ug/mL-FEU — ABNORMAL HIGH (ref 0.00–0.50)

## 2019-12-02 LAB — LACTIC ACID, PLASMA
Lactic Acid, Venous: 3.2 mmol/L (ref 0.5–1.9)
Lactic Acid, Venous: 3.3 mmol/L (ref 0.5–1.9)
Lactic Acid, Venous: 2.3 mmol/L (ref 0.5–1.9)

## 2019-12-02 LAB — SARS CORONAVIRUS 2 BY RT PCR (HOSPITAL ORDER, PERFORMED IN ~~LOC~~ HOSPITAL LAB): SARS Coronavirus 2: POSITIVE — AB

## 2019-12-02 LAB — TRIGLYCERIDES: Triglycerides: 115 mg/dL (ref <150)

## 2019-12-02 LAB — FERRITIN: Ferritin: 1393 ng/mL — ABNORMAL HIGH (ref 24–336)

## 2019-12-02 LAB — C-REACTIVE PROTEIN: CRP: 26.3 mg/dL — ABNORMAL HIGH (ref <1.0)

## 2019-12-02 LAB — FIBRINOGEN: Fibrinogen: 674 mg/dL — ABNORMAL HIGH (ref 210–475)

## 2019-12-02 MED ORDER — ASPIRIN 81 MG PO CHEW
81.0000 mg | CHEWABLE_TABLET | Freq: Every day | ORAL | Status: DC
  Administered 2019-12-03: 81 mg via ORAL
  Filled 2019-12-02: qty 1

## 2019-12-02 MED ORDER — SODIUM CHLORIDE 0.9 % IV SOLN: INTRAVENOUS | Status: DC

## 2019-12-02 MED ORDER — ENOXAPARIN SODIUM 40 MG/0.4ML ~~LOC~~ SOLN
40.0000 mg | SUBCUTANEOUS | Status: DC
  Administered 2019-12-02 – 2019-12-04 (×3): 40 mg via SUBCUTANEOUS
  Filled 2019-12-02 (×3): qty 0.4

## 2019-12-02 MED ORDER — SODIUM CHLORIDE 0.9 % IV SOLN
1.0000 g | Freq: Once | INTRAVENOUS | Status: AC
  Administered 2019-12-02: 1 g via INTRAVENOUS
  Filled 2019-12-02: qty 10

## 2019-12-02 MED ORDER — SODIUM CHLORIDE 0.9 % IV BOLUS
500.0000 mL | Freq: Once | INTRAVENOUS | Status: AC
  Administered 2019-12-02: 500 mL via INTRAVENOUS

## 2019-12-02 MED ORDER — DEXAMETHASONE SODIUM PHOSPHATE 10 MG/ML IJ SOLN
6.0000 mg | Freq: Every day | INTRAMUSCULAR | Status: DC
  Administered 2019-12-02 – 2019-12-05 (×4): 6 mg via INTRAVENOUS
  Filled 2019-12-02 (×4): qty 1

## 2019-12-02 MED ORDER — POLYVINYL ALCOHOL 1.4 % OP SOLN
1.0000 [drp] | Freq: Four times a day (QID) | OPHTHALMIC | Status: DC | PRN
  Filled 2019-12-02: qty 15

## 2019-12-02 MED ORDER — SODIUM CHLORIDE 0.9 % IV SOLN
500.0000 mg | Freq: Once | INTRAVENOUS | Status: AC
  Administered 2019-12-02: 500 mg via INTRAVENOUS
  Filled 2019-12-02: qty 500

## 2019-12-02 MED ORDER — ACETAMINOPHEN 325 MG PO TABS: 650.0000 mg | ORAL_TABLET | Freq: Once | ORAL | Status: DC

## 2019-12-02 NOTE — ED Provider Notes (Signed)
[facility] EMERGENCY DEPARTMENT Provider Note   CSN: [id number] Arrival date & time: 11/26/2019  1127     History Chief Complaint  Patient presents with  . Weakness   LEVEL 5 CAVEAT - ALTERED MENTAL STATUS/RESPIRATORY DISTRESS  Scott Gilbert is a 86 y.o. male with PMHx HTN and mild dementia who presents to the ED today with complaint of "weakness." Per EMS they were called from [facility] for AMS/COVID +. Per EMS pt was found to be hypoxic with O2 sats at 47% on RA. He was placed on a NRB and sent to the ED for further evaluation - it appears he tested POSITIVE for COVID yesterday. Unknown vaccination status per EMS.   Additional information provided by daughter - she reports he received his 2nd COVID vaccine sometime last week. He began feeling ill shortly afterwards and has been declining since then. She states the facility placed him on 2L O2 on Friday (pt is not normally on Oxygen) and were treating him for a URI with Doxycycline and Prednisone. She reports they got a negative CXR last week. She does report pt is a DNR - he has a DNR form with him at bedside; she has a MOST form that she will send over as well.   The history is provided by a relative, the EMS personnel, the nursing home and medical records.       Past Medical History:  Diagnosis Date  . Hypertension   . Mild dementia [facility])     Patient Active Problem List   Diagnosis Date Noted  . Sepsis ([place]) 11/22/2019  . Oropharyngeal dysphagia   . Aphasia   . Palliative care by specialist   . Goals of care, counseling/discussion   . Muscular weakness   . Xerostomia   . Problem related to social environment   . Spiritual distress   . Stroke (cerebrum) ([place]) 04/16/2019    No past surgical history on file.     No family history on file.  Social History   Tobacco Use  . Smoking status: Never Smoker  . Smokeless tobacco: Never Used  Substance Use Topics  . Alcohol use: Not on file   . Drug use: Not on file    Home Medications Prior to Admission medications   Medication Sig Start Date End Date Taking? Authorizing Provider  acetaminophen (TYLENOL) 325 MG tablet Take 2 tablets (650 mg total) by mouth every 4 (four) hours as needed for mild pain (or temp > 37.5 C (99.5 F)). 07/02/19   Metzger-Cihelka, York Cerise, NP  acyclovir (ZOVIRAX) 400 MG tablet Take 1 tablet (400 mg total) by mouth 2 (two) times daily. 07/02/19   Metzger-Cihelka, York Cerise, NP  amLODipine (NORVASC) 10 MG tablet Take 1 tablet (10 mg total) by mouth daily. 07/03/19   Metzger-Cihelka, York Cerise, NP  aspirin 81 MG chewable tablet Chew 1 tablet (81 mg total) by mouth daily. 07/03/19   Metzger-Cihelka, York Cerise, NP  atorvastatin (LIPITOR) 40 MG tablet Take 1 tablet (40 mg total) by mouth daily at 6 PM. 07/02/19   Metzger-Cihelka, York Cerise, NP  feeding supplement, ENSURE ENLIVE, (ENSURE ENLIVE) LIQD Take 237 mLs by mouth 2 (two) times daily between meals. 07/02/19   Metzger-Cihelka, York Cerise, NP  galantamine (RAZADYNE) 12 MG tablet Take 1 tablet (12 mg total) by mouth at bedtime. 07/02/19   Metzger-Cihelka, York Cerise, NP  labetalol (NORMODYNE) 200 MG tablet Take 1 tablet (200 mg total) by mouth 3 (three) times daily. 07/02/19   Metzger-Cihelka, York Cerise,  NP  Multiple Vitamin (MULTIVITAMIN WITH MINERALS) TABS tablet Take 1 tablet by mouth daily. 07/03/19   Metzger-Cihelka, York Cerise, NP  pantoprazole sodium (PROTONIX) 40 mg/20 mL PACK Take 20 mLs (40 mg total) by mouth daily. 07/03/19   Metzger-Cihelka, York Cerise, NP  polyvinyl alcohol (LIQUIFILM TEARS) 1.4 % ophthalmic solution Place 1 drop into the right eye 4 (four) times daily as needed (dry eye). 07/02/19   Metzger-Cihelka, York Cerise, NP  risperiDONE (RISPERDAL) 0.5 MG tablet Take 1 tablet (0.5 mg total) by mouth at bedtime. 07/02/19   Metzger-Cihelka, Desiree, NP  senna-docusate (SENOKOT-S) 8.6-50 MG tablet Take 2 tablets by mouth 2 (two) times daily. 07/02/19   Metzger-Cihelka, York Cerise,  NP    Allergies    Patient has no known allergies.  Review of Systems   Review of Systems  Unable to perform ROS: Mental status change  Respiratory: Positive for shortness of breath.     Physical Exam Updated Vital Signs BP 126/63   Pulse 72   Temp (!) 100.6 F (38.1 C) (Rectal)   Resp (!) 23   SpO2 92%   Physical Exam Vitals and nursing note reviewed.  Constitutional:      Appearance: He is ill-appearing.  HENT:     Head: Normocephalic and atraumatic.  Eyes:     Conjunctiva/sclera: Conjunctivae normal.  Cardiovascular:     Rate and Rhythm: Normal rate and regular rhythm.  Pulmonary:     Effort: Tachypnea present.     Breath sounds: Decreased breath sounds present.  Abdominal:     Palpations: Abdomen is soft.     Tenderness: There is no abdominal tenderness.  Musculoskeletal:     Cervical back: Neck supple.     Right lower leg: No edema.     Left lower leg: No edema.  Skin:    General: Skin is warm and dry.  Neurological:     Mental Status: He is alert.     ED Results / Procedures / Treatments   Labs (all labs ordered are listed, but only abnormal results are displayed) Labs Reviewed  SARS CORONAVIRUS 2 BY RT PCR ([place]) - Abnormal; Notable for the following components:      Result Value   SARS Coronavirus 2 POSITIVE (*)    All other components within normal limits  LACTIC ACID, PLASMA - Abnormal; Notable for the following components:   Lactic Acid, Venous 3.2 (*)    All other components within normal limits  LACTIC ACID, PLASMA - Abnormal; Notable for the following components:   Lactic Acid, Venous 3.3 (*)    All other components within normal limits  CBC WITH DIFFERENTIAL/PLATELET - Abnormal; Notable for the following components:   WBC 18.9 (*)    RBC 3.86 (*)    Hemoglobin 12.1 (*)    HCT 36.9 (*)    Neutro Abs 17.8 (*)    Lymphs Abs 0.5 (*)    Abs Immature Granulocytes 0.15 (*)    All other  components within normal limits  COMPREHENSIVE METABOLIC PANEL - Abnormal; Notable for the following components:   CO2 19 (*)    Glucose, Bld 151 (*)    BUN 100 (*)    Creatinine, Ser 3.21 (*)    Calcium 8.5 (*)    Total Protein 6.2 (*)    Albumin 2.5 (*)    AST 1,778 (*)    ALT 1,113 (*)    GFR calc non Af Amer 16 (*)  GFR calc Af Amer 19 (*)    Anion gap 17 (*)    All other components within normal limits  D-DIMER, QUANTITATIVE (NOT AT [facility]) - Abnormal; Notable for the following components:   D-Dimer, Quant 5.03 (*)    All other components within normal limits  LACTATE DEHYDROGENASE - Abnormal; Notable for the following components:   LDH 2,038 (*)    All other components within normal limits  FERRITIN - Abnormal; Notable for the following components:   Ferritin 1,393 (*)    All other components within normal limits  FIBRINOGEN - Abnormal; Notable for the following components:   Fibrinogen 674 (*)    All other components within normal limits  C-REACTIVE PROTEIN - Abnormal; Notable for the following components:   CRP 26.3 (*)    All other components within normal limits  CULTURE, BLOOD (ROUTINE X 2)  CULTURE, BLOOD (ROUTINE X 2)  PROCALCITONIN  TRIGLYCERIDES  LIPASE, BLOOD  I-STAT ARTERIAL BLOOD GAS, ED    EKG EKG Interpretation  Date/Time:  Thursday December 02 2019 14:43:31 EDT Ventricular Rate:  71 PR Interval:    QRS Duration: 136 QT Interval:  435 QTC Calculation: 473 R Axis:   61 Text Interpretation: Sinus rhythm Atrial premature complexes Right bundle branch block similar to Jan 2021 Confirmed by Sherwood Gambler [phone]) on 11/25/2019 2:46:02 PM   Radiology US Abdomen Complete  Result Date: 12/04/2019 CLINICAL DATA:  elevated LFTs EXAM: ABDOMEN ULTRASOUND COMPLETE COMPARISON:  None. FINDINGS: Gallbladder: Nonvisualized Common bile duct: Diameter: 2.7 mm Liver: Increased echotexture seen throughout. No focal abnormality or biliary ductal dilatation. Portal  vein is patent on color Doppler imaging with normal direction of blood flow towards the liver. IVC: No abnormality visualized. Pancreas: Visualized portion unremarkable. Spleen: Normal in size. There is a low-density lesion seen within the spleen measuring 1.0 x 1.2 x 1.3 cm. Right Kidney: Length: 9.5 cm. Echogenicity within normal limits. No mass or hydronephrosis visualized. Left Kidney: Length: 8.5 cm. Mild renal atrophy seen. No mass or hydronephrosis visualized. Abdominal aorta: No aneurysm in the visualized portion. Other findings: None. IMPRESSION: Nonvisualized gallbladder which may be due to decompressed state. Hepatic steatosis Electronically Signed   By: Prudencio Pair M.D.   On: 11/22/2019 16:26   DG Chest Port 1 View  Result Date: 12/16/2019 CLINICAL DATA:  Cough. COVID positive. EXAM: PORTABLE CHEST 1 VIEW COMPARISON:  04/30/2019 FINDINGS: The cardiac silhouette, mediastinal and hilar contours are within normal limits for the patient's age and AP projection and portable technique. There is tortuosity and calcification of the thoracic aorta. Increased interstitial markings and patchy ill-defined infiltrates consistent with COVID pneumonia. No pleural effusions or pulmonary lesions. IMPRESSION: Patchy bilateral infiltrates consistent with COVID pneumonia. Electronically Signed   By: Marijo Sanes M.D.   On: 11/25/2019 12:09    Procedures .Critical Care Performed by: Eustaquio Maize, PA-C Authorized by: Eustaquio Maize, PA-C   Critical care provider statement:    Critical care time (minutes):  45   Critical care was necessary to treat or prevent imminent or life-threatening deterioration of the following conditions: hypoxia.   Critical care was time spent personally by me on the following activities:  Discussions with consultants, evaluation of patient's response to treatment, examination of patient, ordering and performing treatments and interventions, ordering and review of laboratory  studies, ordering and review of radiographic studies, pulse oximetry, re-evaluation of patient's condition, obtaining history from patient or surrogate and review of old charts   (including critical care time)  Medications Ordered in ED Medications  aspirin chewable tablet 81 mg (has no administration in time range)  polyvinyl alcohol (LIQUIFILM TEARS) 1.4 % ophthalmic solution 1 drop (has no administration in time range)  enoxaparin (LOVENOX) injection 40 mg (has no administration in time range)  0.9 %  sodium chloride infusion (has no administration in time range)  cefTRIAXone (ROCEPHIN) 1 g in sodium chloride 0.9 % 100 mL IVPB (0 g Intravenous Stopped 12/03/2019 1525)  azithromycin (ZITHROMAX) 500 mg in sodium chloride 0.9 % 250 mL IVPB (0 mg Intravenous Stopped 12/09/2019 1557)  sodium chloride 0.9 % bolus 500 mL (0 mLs Intravenous Stopped 12/13/2019 1635)    ED Course  I have reviewed the triage vital signs and the nursing notes.  Pertinent labs & imaging results that were available during my care of the patient were reviewed by me and considered in my medical decision making (see chart for details).  Clinical Course as of Dec 01 1644  Thu Dec 02, 2019  1441 SARS Coronavirus 2(!): POSITIVE [MV]    Clinical Course User Index [MV] Eustaquio Maize, [place]   MDM Rules/Calculators/A&P                          86 year old male with a history of dementia presents to the ED via EMS today for hypoxia with O2 sats at 47% on room air, placed on 15 L nonrebreather on arrival with EMS satting about 88 to 90%.  Patient appears to be working hard to breathe, he is tachypneic.  He is unable to speak in full sentences due to respiratory distress.  It appears he tested positive for Covid yesterday.  Per daughter who is able to give more information patient is not typically on oxygen however was placed on 2 L last week after feeling "ill" after receiving his second Covid vaccine.  Was being treated for a URI  with doxycycline and prednisone however has slowly been declining.  They decided to test patient for Covid yesterday and it came back positive.  We unfortunately do not have this in our system, will repeat test today.  We will have respiratory therapist come see patient.  He will need to come into the hospital today.  He is a DNR and daughter does reiterate that he would not want chest compressions or intubation.  He does have a MOST form that she will fax over.  Pre-admission Covid labs ordered.  CBC significantly elevated at 18.9 with left shift. CXR with findings concerning for COVID PNA. COVID test still has not returned - will plan to cover with abx for PNA given elevated leukocytosis at this time.  CMP with significantly elevated AST/ALT. Pt without any abdominal TTP; no mention of nausea or vomiting at SNF. Creatinine also elevated at 3.21; will provide fluids at this time. Will plan for ultrasound to assess gallbladder and kidneys.   Hepatic Function Latest Ref Rng & Units 11/22/2019 04/16/2019  Total Protein 6.5 - 8.1 g/dL 6.2(L) 6.9  Albumin 3.5 - 5.0 g/dL 2.5(L) 3.8  AST 15 - 41 U/L 1,778(H) 29  ALT 0 - 44 U/L 1,113(H) 25  Alk Phosphatase 38 - 126 U/L 102 100  Total Bilirubin 0.3 - 1.2 mg/dL 1.2 0.5   Lab Results  Component Value Date   CREATININE 3.21 (H) 11/21/2019   CREATININE 1.14 06/27/2019   CREATININE 1.32 (H) 06/23/2019   Lactic acid elevated 3.2; fluids ordered Fibrinogen, D dimer, CRP, LDH, and Ferritin  all elevated consistent with possible COVID infection - still awaiting results.   COVID positive. Unfortunately with pt's elevated LFTs he does not qualify for remdesivir at this time. Will call to admit.   Discussed case with Internal Medicine who will admit.   This note was prepared using Dragon voice recognition software and may include unintentional dictation errors due to the inherent limitations of voice recognition software.  Final Clinical Impression(s) / ED  Diagnoses Final diagnoses:  COVID-19  Hypoxia  Elevated LFTs  AKI (acute kidney injury) [facility])    Rx / DC Orders ED Discharge Orders    None       Eustaquio Maize, PA-C 11/19/2019 1646    Sherwood Gambler, MD 12/03/19 1438

## 2019-12-02 NOTE — ED Triage Notes (Signed)
Arrived via [place] from [place] stating patient tested Positive for COVID 12/01/19 and today he started having more weakness and was altered. EMS stated patient was 46% on RA and they put him on 15L NRB and he was 90-92%. Patient was getting fluids D5 1/2 NS at 71ml/hr at the nursing home and they did not discontinue it.

## 2019-12-02 NOTE — H&P (Signed)
Date: 12/09/2019               Patient Name:  Scott Gilbert MRN: [id number]  DOB: 11-01-1929 Age / Sex: 86 y.o., male   PCP: [facility]         Medical Service: Internal Medicine Teaching Service         Attending Physician: Dr. Oswaldo Done, Marquita Palms, *    First Contact: Dr. Karilyn Cota Pager: [phone]  Second Contact: Dr. Oswaldo Done Pager: [phone]       After Hours (After 5p/  First Contact Pager: [phone]  weekends / holidays): Second Contact Pager: [phone]   Chief Complaint: Altered mental status, hypoxia  History of Present Illness: Scott Gilbert is an 86 year old male with a known past medical history of mild dementia/paranoid schizoparenia, history of stroke, GERD and hypertension presenting from his skilled nursing facility [facility] with altered mental status and hypoxia.   History was obtained from the ED provider and patient's daughter.  Patient received his second dose of the maternal vaccine last week and started feeling unwell preceding this.  He progressively felt worse and was started on 2 L supplemental oxygen and on Friday.  At baseline patient does not require any supplemental oxygen.  He was found to be Covid positive on 9/15, with altered mental status and saturating as low as 47% on room air, subsequently brought to the emergency department.  Upon arrival patient was febrile, T-max 100.6 F and placed on 15 L saturating at 94%.  He was normotensive and slightly tachypneic.  Lactic acid 3.3.  Leukocytosis with leftward shift.  AKI with a creatinine of 3.2, baseline approximately 1.2-1.3.  Found to have transaminitis.  Inflammatory markers significantly elevated LDH 2038, ferritin 1393 C-reactive protein 26.  Procalcitonin also elevated at 2.63.  Blood and urine cultures were collected.  Abdominal ultrasound demonstrated hepatic steatosis.  Chest x-ray consistent with Covid pneumonia.  Patient was started on ceftriaxone and azithromycin.  Per  daughter, at baseline patient is able to respond to yes and no questions, say their names and a couple weeks ago was able to wheel himself in his wheelchair.  He had a stroke in January 2021 with residual deficits predominantly in his right lower extremity and dysphagia.  He is able to tolerate mostly soft foods.  He has a history of paranoid schizophrenia and responds positive engagements.  Meds:   No current facility-administered medications on file prior to encounter.   Current Outpatient Medications on File Prior to Encounter  Medication Sig Dispense Refill  . acetaminophen (TYLENOL) 325 MG tablet Take 2 tablets (650 mg total) by mouth every 4 (four) hours as needed for mild pain (or temp > 37.5 C (99.5 F)).    Marland Kitchen acyclovir (ZOVIRAX) 400 MG tablet Take 1 tablet (400 mg total) by mouth 2 (two) times daily. 30 tablet 1  . amLODipine (NORVASC) 10 MG tablet Take 1 tablet (10 mg total) by mouth daily. 31 tablet 1  . aspirin 81 MG chewable tablet Chew 1 tablet (81 mg total) by mouth daily.    Marland Kitchen atorvastatin (LIPITOR) 40 MG tablet Take 1 tablet (40 mg total) by mouth daily at 6 PM. 31 tablet 1  . feeding supplement, ENSURE ENLIVE, (ENSURE ENLIVE) LIQD Take 237 mLs by mouth 2 (two) times daily between meals. 237 mL 12  . galantamine (RAZADYNE) 12 MG tablet Take 1 tablet (12 mg total) by mouth at bedtime. 31 tablet 1  . labetalol (  NORMODYNE) 200 MG tablet Take 1 tablet (200 mg total) by mouth 3 (three) times daily. 31 tablet 1  . Multiple Vitamin (MULTIVITAMIN WITH MINERALS) TABS tablet Take 1 tablet by mouth daily. 31 tablet 1  . pantoprazole sodium (PROTONIX) 40 mg/20 mL PACK Take 20 mLs (40 mg total) by mouth daily. 30 mL 1  . polyvinyl alcohol (LIQUIFILM TEARS) 1.4 % ophthalmic solution Place 1 drop into the right eye 4 (four) times daily as needed (dry eye). 15 mL 0  . risperiDONE (RISPERDAL) 0.5 MG tablet Take 1 tablet (0.5 mg total) by mouth at bedtime. 31 tablet 1  . senna-docusate (SENOKOT-S)  8.6-50 MG tablet Take 2 tablets by mouth 2 (two) times daily. 31 tablet 1    No outpatient medications have been marked as taking for the 12/03/2019 encounter [facility] Encounter).     Allergies: Allergies as of 11/25/2019  . (No Known Allergies)   Past Medical History:  Diagnosis Date  . Hypertension   . Mild dementia (HCC)     Family History:   Father passed of an MI Mother breast cancer and dementia  No family history on file.  Social History:   Smoked in the past, unclear history; exposed to second hand smoke.   Review of Systems: Unable to complete due to patient's mental stauts  Physical Exam: Blood pressure 126/63, pulse 72, temperature (!) 100.6 F (38.1 C), temperature source Rectal, resp. rate (!) 23, SpO2 92 %.  Physical Exam Constitutional:      General: He is not in acute distress.    Appearance: He is ill-appearing. He is not toxic-appearing or diaphoretic.  Eyes:     General:        Right eye: No discharge.        Left eye: No discharge.     Conjunctiva/sclera: Conjunctivae normal.  Cardiovascular:     Rate and Rhythm: Normal rate and regular rhythm.     Pulses: Normal pulses.     Heart sounds: Normal heart sounds. No murmur heard.  No friction rub. No gallop.   Pulmonary:     Breath sounds: Wheezing present. No rhonchi or rales.     Comments: Bilateral coarse crackles and wheezes Chest:     Chest wall: No tenderness.  Abdominal:     General: Bowel sounds are normal. There is no distension.     Palpations: Abdomen is soft.     Tenderness: There is no abdominal tenderness. There is no guarding.  Musculoskeletal:        General: Deformity present. No swelling or tenderness.     Comments: Right leg mildly contracted  Skin:    General: Skin is warm and dry.  Neurological:     Comments: Unable to communicate clearly, unclear if oriented to person place or time     EKG: personally reviewed my interpretation is sinus rhythm  CXR: personally  reviewed my interpretation is diffuse patchy airspace disease, consistent with COVID-19.  Assessment & Plan by Problem: Principal Problem:   Sepsis (HCC) Active Problems:   COVID-19 virus infection   AKI (acute kidney injury) (HCC)   Transaminitis   History of stroke with current residual effects  86 year old male with known past medical history of stroke, hypertension mild dementia/paranoid schizophrenia presenting with altered mental status and hypoxia, found to be positive for COVID-19 on 9/15.  Sepsis COVID-19 Patient presented with altered mental status, hypoxia, fever T-max 100.6 F, le leukocytosis with leftward shift and an elevated lactic acid.  Currently saturating at 92% on 15 L nasal cannula and nonrebreather.  Given elevation of procalcitonin and along with other laboratory findings clinical suspicion for a superimposed bacterial infection.  Urine and blood cultures are pending.  Patient was started on ceftriaxone and azithromycin.  We will continue to trend inflammatory markers.  Due to transaminitis cannot start patient on remdesivir.  Patient's AKI also prevents [place] from initiating worsening of treatment at this time.  Initiated Decadron 6 mg daily.  Of note patient was vaccinated with Moderna, second dose given last week.  At baseline patient does not require supplemental oxygen.  Given patient's mentation may be difficult to encourage pulmonary rehab via incentive spirometer and flutter valve.  - Decadron day 1 - Continue IV antibiotic therapy, will consider broader spectrum coverage starting tomorrow - Incentive spirometer if patient tolerates - O2 goal of greater than 92% - Trend lactic acid, continue maintenance fluids  - Follow up cultures   Transaminitis Found to have significantly elevated LFTs, AST 1770 and ALT 1113.  Abdominal ultrasound demonstrated hepatic steatosis, no other acute findings.  - Trend LFTs  AKI Creatinine 3.2, baseline 1.2-1.3.  Gentle  hydration.  Given 500 mls normal saline bolus in the ED.    -100 mils per hour normal saline infusion over 10 hours  History of stroke Patient had a left anterior cerebral circulation infarct January 2021 with residual right-sided weakness.  He is predominantly bedridden and requires assistance moving from bed to chair.  According to his daughter he was able to push himself in the chair a few weeks ago.  Continued deficits include right hemiparesis, dysphagia and speech difficulty.   Hypertension Home medications include amlodipine 10 mg and labetalol 200 mg 3 times a day.  Currently holding these medications.  VTE prophylaxis: Lovenox IV fluids: Normal saline Diet: Soft foods CODE STATUS: DNR  Dispo: Admit patient to Inpatient with expected length of stay greater than 2 midnights.  Signed: Jaci Standard, DO 12-26-2019, 5:27 PM  Pager: [phone] After 5pm on weekdays and 1pm on weekends: On Call pager: [phone]

## 2019-12-03 DIAGNOSIS — A4189 Other specified sepsis: Secondary | ICD-10-CM | POA: Diagnosis not present

## 2019-12-03 DIAGNOSIS — U071 COVID-19: Secondary | ICD-10-CM | POA: Diagnosis not present

## 2019-12-03 DIAGNOSIS — J1282 Pneumonia due to coronavirus disease 2019: Secondary | ICD-10-CM | POA: Diagnosis not present

## 2019-12-03 DIAGNOSIS — J9601 Acute respiratory failure with hypoxia: Secondary | ICD-10-CM | POA: Diagnosis not present

## 2019-12-03 LAB — CBC WITH DIFFERENTIAL/PLATELET
Abs Immature Granulocytes: 0.11 10*3/uL — ABNORMAL HIGH (ref 0.00–0.07)
Basophils Absolute: 0 10*3/uL (ref 0.0–0.1)
Basophils Relative: 0 %
Eosinophils Absolute: 0 10*3/uL (ref 0.0–0.5)
Eosinophils Relative: 0 %
HCT: 37.9 % — ABNORMAL LOW (ref 39.0–52.0)
Hemoglobin: 12.2 g/dL — ABNORMAL LOW (ref 13.0–17.0)
Immature Granulocytes: 1 %
Lymphocytes Relative: 3 %
Lymphs Abs: 0.4 10*3/uL — ABNORMAL LOW (ref 0.7–4.0)
MCH: 31.1 pg (ref 26.0–34.0)
MCHC: 32.2 g/dL (ref 30.0–36.0)
MCV: 96.7 fL (ref 80.0–100.0)
Monocytes Absolute: 0.4 10*3/uL (ref 0.1–1.0)
Monocytes Relative: 2 %
Neutro Abs: 15.4 10*3/uL — ABNORMAL HIGH (ref 1.7–7.7)
Neutrophils Relative %: 94 %
Platelets: 324 10*3/uL (ref 150–400)
RBC: 3.92 MIL/uL — ABNORMAL LOW (ref 4.22–5.81)
RDW: 13.2 % (ref 11.5–15.5)
WBC: 16.3 10*3/uL — ABNORMAL HIGH (ref 4.0–10.5)
nRBC: 0.1 % (ref 0.0–0.2)

## 2019-12-03 LAB — COMPREHENSIVE METABOLIC PANEL
ALT: 765 U/L — ABNORMAL HIGH (ref 0–44)
AST: 403 U/L — ABNORMAL HIGH (ref 15–41)
Albumin: 2.6 g/dL — ABNORMAL LOW (ref 3.5–5.0)
Alkaline Phosphatase: 108 U/L (ref 38–126)
Anion gap: 13 (ref 5–15)
BUN: 97 mg/dL — ABNORMAL HIGH (ref 8–23)
CO2: 20 mmol/L — ABNORMAL LOW (ref 22–32)
Calcium: 8.3 mg/dL — ABNORMAL LOW (ref 8.9–10.3)
Chloride: 113 mmol/L — ABNORMAL HIGH (ref 98–111)
Creatinine, Ser: 2.41 mg/dL — ABNORMAL HIGH (ref 0.61–1.24)
GFR calc Af Amer: 27 mL/min — ABNORMAL LOW (ref >60)
GFR calc non Af Amer: 23 mL/min — ABNORMAL LOW (ref >60)
Glucose, Bld: 155 mg/dL — ABNORMAL HIGH (ref 70–99)
Potassium: 4 mmol/L (ref 3.5–5.1)
Sodium: 146 mmol/L — ABNORMAL HIGH (ref 135–145)
Total Bilirubin: 1 mg/dL (ref 0.3–1.2)
Total Protein: 6.3 g/dL — ABNORMAL LOW (ref 6.5–8.1)

## 2019-12-03 LAB — LACTIC ACID, PLASMA: Lactic Acid, Venous: 2.6 mmol/L (ref 0.5–1.9)

## 2019-12-03 LAB — MAGNESIUM: Magnesium: 2.9 mg/dL — ABNORMAL HIGH (ref 1.7–2.4)

## 2019-12-03 LAB — PHOSPHORUS: Phosphorus: 4.8 mg/dL — ABNORMAL HIGH (ref 2.5–4.6)

## 2019-12-03 MED ORDER — SODIUM CHLORIDE 0.9 % IV SOLN
1.0000 g | INTRAVENOUS | Status: DC
  Administered 2019-12-03 – 2019-12-04 (×2): 1 g via INTRAVENOUS
  Filled 2019-12-03 (×3): qty 10

## 2019-12-03 MED ORDER — LACTATED RINGERS IV SOLN: INTRAVENOUS | Status: DC

## 2019-12-03 MED ORDER — DEXTROSE-NACL 5-0.45 % IV SOLN: INTRAVENOUS | Status: DC

## 2019-12-03 NOTE — Progress Notes (Addendum)
   Subjective:   Unable to communicate due to underlying dementia.   Objective:  Vital signs in last 24 hours: Vitals:   12/03/19 0145 12/03/19 0230 12/03/19 0345 12/03/19 0500  BP: 139/76 (!) 146/70 138/63 (!) 149/64  Pulse: 77 79 78 85  Resp: (!) 21 (!) 21 18 (!) 25  Temp:      TempSrc:      SpO2: 99% 100% 96% 100%    Supplemental Oxygen: 15L HFNC + NRB  Constitution: NAD, appears stated age Cardio: RRR, no m/r/g, no LE edema  Respiratory: bilateral coarse crackles and wheezing Abdominal: NTTP, soft, non-distended MSK: right lower extremity is mildly contracted Neuro: no alert or oriented Skin: c/d/i   Assessment/Plan:  Principal Problem:   Sepsis (HCC) Active Problems:   COVID-19 virus infection   AKI (acute kidney injury) (HCC)   Transaminitis   History of stroke with current residual effects  86 year old male with known past medical history of stroke, hypertension mild dementia/paranoid schizophrenia presenting with altered mental status and hypoxia, found to be positive for COVID-19 on 9/15.  Sepsis COVID-19 Hospital day 2. Lactic acid 2.6, trending downward. Given 1L NS bolus and continued on 187ml/hr NS. Saturating at 94% on 15L HFNC + NRB. Afebrile and normotensive. Does not appear in any acute distress. Blood cultures are pending; will continue ceftriaxone for possible superimposed bacterial infection. Continue to trend inflammatory markers. Unclear at this time if patient's sepsis 2/2 to COVID or other underlying infectious cause. If patient status worsens can consider broader antibiotic coverage.   - decadron day 2 - ceftriaxone day 2 - continue fluids until patient is able to tolerate diet - O2 goal of >92% - follow up cultures - trend inflammatory markers   Transaminitis LFT's trending downward, AST 403 and ALT 765. US showed fatty liver.   - Trend LFTs  AKI Creatinine 2.1, improved from 3.2. Continue gentle hydration.   - Trend CMP -  Gentle hydration, 100 ml/hr LR    VTE prophylaxis: Lovenox IV fluids: LR Diet: Soft foods CODE STATUS: DNR  Dispo: Anticipated discharge pending clinical improvement.   Abeeha Twist N, DO 12/03/2019, 7:49 AM Pager: [phone] After 5pm on weekdays and 1pm on weekends: On Call Pager: [phone]

## 2019-12-03 NOTE — ED Notes (Signed)
Pt O2 sats noted to be 32% with good pleth. This RN & Ellie RN went to pt's bedside. Pt noted to have removed NRB mask, gasping for air & using accessory muscles. This RN placed NRB mask back on pt while Ellie RN called RT to check on pt. Pt's O2 sats back to 96%, will continue to monitor.

## 2019-12-03 NOTE — ED Notes (Signed)
Pt found again with NRB mask/HFNC at chin, reminded of need for supplemental oxygen. NRB/HFNC replaced, pt adjusted in bed, lights dimmed for comfort. No additional requests expressed.

## 2019-12-03 NOTE — Evaluation (Signed)
Clinical/Bedside Swallow Evaluation Patient Details  Name: Scott Gilbert MRN: [id number] Date of Birth: [birth year]  Today's Date: 12/03/2019 Time: SLP Start Time (ACUTE ONLY): 1120 SLP Stop Time (ACUTE ONLY): 1143 SLP Time Calculation (min) (ACUTE ONLY): 23 min  Past Medical History:  Past Medical History:  Diagnosis Date  . Hypertension   . Mild dementia [facility])    Past Surgical History: No past surgical history on file. HPI:  86 y.o. male with PMHx HTN, left CVA (with aphasia/dysphagia at that time), and mild dementia who presented to the ED from [facility] for AMS/COVID +. Per EMS pt was found to be hypoxic with O2 sats at 47% on RA.    Assessment / Plan / Recommendation Clinical Impression  Pt alert, intermittently following commands, minimal speech.  Sp02 91% on HFNC and NRB.  Oral care provided. Pt accepted sips of water and spoons of applesauce with adequate oral attention and manipulation;  no s/s of aspiration.  He consumed two six ounce cups of water from a straw with mild increased WOB, but no s/s of impaired airway protection.  Recommend initiating a full liquid diet due to lethargy.  SLP will follow for safety/diet advancement.  D/W RN.  SLP Visit Diagnosis: Dysphagia, unspecified (R13.10)    Aspiration Risk  Mild aspiration risk    Diet Recommendation   full liquids  Medication Administration: Whole meds with puree    Other  Recommendations Oral Care Recommendations: Oral care BID   Follow up Recommendations Other (comment) (tba)      Frequency and Duration min 2x/week  2 weeks       Prognosis Prognosis for Safe Diet Advancement: Good Barriers to Reach Goals: Cognitive deficits      Swallow Study   General Date of Onset: 11/26/2019 HPI: 86 y.o. male with PMHx HTN, left CVA (with aphasia/dysphagia at that time), and mild dementia who presented to the ED from [place] [facility] for AMS/COVID +. Per EMS pt was found to be hypoxic with O2 sats at 47% on  RA.  Type of Study: Bedside Swallow Evaluation Previous Swallow Assessment: feb 2021 Diet Prior to this Study: NPO Temperature Spikes Noted: No Respiratory Status: Nasal cannula;Non-rebreather (HFNC) History of Recent Intubation: No Behavior/Cognition: Alert Oral Cavity Assessment: Dried secretions Oral Care Completed by SLP: Yes Self-Feeding Abilities: Needs assist Patient Positioning: Upright in bed Baseline Vocal Quality: Low vocal intensity Volitional Cough: Weak Volitional Swallow: Able to elicit    Oral/Motor/Sensory Function Overall Oral Motor/Sensory Function: Within functional limits   Ice Chips Ice chips: Within functional limits   Thin Liquid Thin Liquid: Within functional limits    Nectar Thick Nectar Thick Liquid: Not tested   Honey Thick Honey Thick Liquid: Not tested   Puree Puree: Within functional limits   Solid     Solid: Not tested      Blenda Mounts Laurice 12/03/2019,1:08 PM   Marchelle Folks L. Samson Frederic, MA CCC/SLP Acute Rehabilitation Services Office number [phone] Pager [phone]

## 2019-12-04 ENCOUNTER — Inpatient Hospital Stay (HOSPITAL_COMMUNITY): Payer: No Typology Code available for payment source

## 2019-12-04 DIAGNOSIS — E870 Hyperosmolality and hypernatremia: Secondary | ICD-10-CM

## 2019-12-04 DIAGNOSIS — J9601 Acute respiratory failure with hypoxia: Secondary | ICD-10-CM | POA: Diagnosis not present

## 2019-12-04 DIAGNOSIS — U071 COVID-19: Secondary | ICD-10-CM | POA: Diagnosis not present

## 2019-12-04 DIAGNOSIS — J1282 Pneumonia due to coronavirus disease 2019: Secondary | ICD-10-CM | POA: Diagnosis not present

## 2019-12-04 DIAGNOSIS — A4189 Other specified sepsis: Secondary | ICD-10-CM | POA: Diagnosis not present

## 2019-12-04 DIAGNOSIS — L8990 Pressure ulcer of unspecified site, unspecified stage: Secondary | ICD-10-CM | POA: Diagnosis present

## 2019-12-04 LAB — COMPREHENSIVE METABOLIC PANEL
ALT: 574 U/L — ABNORMAL HIGH (ref 0–44)
AST: 223 U/L — ABNORMAL HIGH (ref 15–41)
Albumin: 2.6 g/dL — ABNORMAL LOW (ref 3.5–5.0)
Alkaline Phosphatase: 122 U/L (ref 38–126)
Anion gap: 14 (ref 5–15)
BUN: 87 mg/dL — ABNORMAL HIGH (ref 8–23)
CO2: 20 mmol/L — ABNORMAL LOW (ref 22–32)
Calcium: 8.4 mg/dL — ABNORMAL LOW (ref 8.9–10.3)
Chloride: 115 mmol/L — ABNORMAL HIGH (ref 98–111)
Creatinine, Ser: 1.89 mg/dL — ABNORMAL HIGH (ref 0.61–1.24)
GFR calc Af Amer: 36 mL/min — ABNORMAL LOW (ref >60)
GFR calc non Af Amer: 31 mL/min — ABNORMAL LOW (ref >60)
Glucose, Bld: 216 mg/dL — ABNORMAL HIGH (ref 70–99)
Potassium: 4 mmol/L (ref 3.5–5.1)
Sodium: 149 mmol/L — ABNORMAL HIGH (ref 135–145)
Total Bilirubin: 1.2 mg/dL (ref 0.3–1.2)
Total Protein: 6.3 g/dL — ABNORMAL LOW (ref 6.5–8.1)

## 2019-12-04 LAB — CBC WITH DIFFERENTIAL/PLATELET
Abs Immature Granulocytes: 0.2 10*3/uL — ABNORMAL HIGH (ref 0.00–0.07)
Basophils Absolute: 0 10*3/uL (ref 0.0–0.1)
Basophils Relative: 0 %
Eosinophils Absolute: 0 10*3/uL (ref 0.0–0.5)
Eosinophils Relative: 0 %
HCT: 40 % (ref 39.0–52.0)
Hemoglobin: 12.7 g/dL — ABNORMAL LOW (ref 13.0–17.0)
Immature Granulocytes: 1 %
Lymphocytes Relative: 2 %
Lymphs Abs: 0.4 10*3/uL — ABNORMAL LOW (ref 0.7–4.0)
MCH: 31 pg (ref 26.0–34.0)
MCHC: 31.8 g/dL (ref 30.0–36.0)
MCV: 97.6 fL (ref 80.0–100.0)
Monocytes Absolute: 0.6 10*3/uL (ref 0.1–1.0)
Monocytes Relative: 3 %
Neutro Abs: 19.5 10*3/uL — ABNORMAL HIGH (ref 1.7–7.7)
Neutrophils Relative %: 94 %
Platelets: 333 10*3/uL (ref 150–400)
RBC: 4.1 MIL/uL — ABNORMAL LOW (ref 4.22–5.81)
RDW: 13.3 % (ref 11.5–15.5)
WBC: 20.8 10*3/uL — ABNORMAL HIGH (ref 4.0–10.5)
nRBC: 0.2 % (ref 0.0–0.2)

## 2019-12-04 LAB — FERRITIN: Ferritin: 479 ng/mL — ABNORMAL HIGH (ref 24–336)

## 2019-12-04 LAB — D-DIMER, QUANTITATIVE: D-Dimer, Quant: >20 ug/mL-FEU — ABNORMAL HIGH (ref 0.00–0.50)

## 2019-12-04 LAB — MRSA PCR SCREENING: MRSA by PCR: NEGATIVE

## 2019-12-04 LAB — PROCALCITONIN: Procalcitonin: 1.17 ng/mL

## 2019-12-04 LAB — PHOSPHORUS: Phosphorus: 3.8 mg/dL (ref 2.5–4.6)

## 2019-12-04 LAB — MAGNESIUM: Magnesium: 3 mg/dL — ABNORMAL HIGH (ref 1.7–2.4)

## 2019-12-04 LAB — LACTIC ACID, PLASMA: Lactic Acid, Venous: 3.3 mmol/L (ref 0.5–1.9)

## 2019-12-04 LAB — C-REACTIVE PROTEIN: CRP: 12.1 mg/dL — ABNORMAL HIGH (ref <1.0)

## 2019-12-04 MED ORDER — IOHEXOL 350 MG/ML SOLN
60.0000 mL | Freq: Once | INTRAVENOUS | Status: AC | PRN
  Administered 2019-12-04: 60 mL via INTRAVENOUS

## 2019-12-04 MED ORDER — LACTATED RINGERS IV BOLUS
500.0000 mL | Freq: Once | INTRAVENOUS | Status: AC
  Administered 2019-12-04: 500 mL via INTRAVENOUS

## 2019-12-04 MED ORDER — DEXTROSE 5 % IV SOLN: INTRAVENOUS | Status: DC

## 2019-12-04 NOTE — Progress Notes (Signed)
I called Radiology to follow up for CT angio chest that we ordered to r/u PE.  RN was not able to take pt down top radiology. I talked to RN. Unfortunately they are understaffed at 5W but will have more staff in about an hour. I expressed the concern for PE for this pt. RN will follow up to make sure this will be done sometime soon at night.  Patient HR is improved.

## 2019-12-04 NOTE — Progress Notes (Addendum)
   Subjective: Patient unable to communicate likely due to underlying dementia. Was sleeping during the encounter today, spontaneously awake but not interactive.   Objective:  Vital signs in last 24 hours: Vitals:   12/04/19 0029 12/04/19 0418 12/04/19 0718 12/04/19 0722  BP: (!) 145/89 (!) 161/81 (!) 147/68 (!) 151/74  Pulse: (!) 102 (!) 103 (!) 114 (!) 104  Resp: 16 20 18 18   Temp: 97.6 F (36.4 C) 97.8 F (36.6 C) 98.7 F (37.1 C) 98.7 F (37.1 C)  TempSrc: Axillary Axillary Axillary Axillary  SpO2: 98% 98% 97% 98%    Supplemental Oxygen: 98% on 15L HFNC  Constitution: seen resting in bed comfortably, NAD Cardio: RRR, no m/r/g, no LE edema  Respiratory: mild bilateral crackles, wheezing Abdominal: NTTP, soft, non-distended MSK: moving all extremities Neuro: not alert or oriented Skin: c/d/i   Assessment/Plan:  Principal Problem:   Sepsis (HCC) Active Problems:   COVID-19 virus infection   AKI (acute kidney injury) (HCC)   Transaminitis   History of stroke with current residual effects   Pressure injury of skin   86 year old male with known past medical history of stroke, hypertension mild dementia/paranoid schizophrenia presenting with altered mental status and hypoxia, found to be positive for COVID-19 on 9/15.   Sepsis COVID-19 Hospital day 3. 98% on 15L HFNC. Clinical condition is predominantly unchanged. Patient is hypertensive and mildly tachycardic. Inflammatory markers pending. Procalcitonin remains elevated; continuing IV antibiotics.    - decadron day 3 - ceftriaxone day 3 - SLP to reevaluate  - O2 goal of >92% - blood cultures show no growth - trend inflammatory markers   Hypernatremia: Likely worsening despite IV fluids yesterday. Appears a little hypovolemic today. Plan to bolus with 10/15 of LR, then give D5 water 126ml/hr for overnight. No oral intake, and high insensible losses, so will need to keep up with hydration.   Transaminitis LFT's  improving.    - Trend LFTs   AKI Creatinine 1.89, improving. Continue gentle hydration.    - Trend CMP - Gentle hydration, D5 1/2 NS 100 ml/hr      VTE prophylaxis: Lovenox IV fluids: LR Diet: NPO CODE STATUS: DNR   Dispo: Anticipated discharge pending clinical improvement.   Eesha Schmaltz N, DO 12/04/2019, 7:30 AM Pager: [phone] After 5pm on weekdays and 1pm on weekends: On Call Pager: [phone]

## 2019-12-04 NOTE — Progress Notes (Cosign Needed)
Tried to give pt water to drink. Patient did not tolerate and was unable to swallow. Talked with Charge nurse and placed a NPO order. Will ask SLP to follow up as SLP consult is already in place.

## 2019-12-04 NOTE — Progress Notes (Signed)
MEWS fired yellow at 0718. No interventions implemented. Will continue to monitor patient.

## 2019-12-05 ENCOUNTER — Inpatient Hospital Stay (HOSPITAL_COMMUNITY): Payer: No Typology Code available for payment source

## 2019-12-05 DIAGNOSIS — I213 ST elevation (STEMI) myocardial infarction of unspecified site: Secondary | ICD-10-CM | POA: Diagnosis present

## 2019-12-05 DIAGNOSIS — A419 Sepsis, unspecified organism: Secondary | ICD-10-CM | POA: Diagnosis not present

## 2019-12-05 DIAGNOSIS — Z515 Encounter for palliative care: Secondary | ICD-10-CM | POA: Diagnosis not present

## 2019-12-05 DIAGNOSIS — J1282 Pneumonia due to coronavirus disease 2019: Secondary | ICD-10-CM | POA: Diagnosis not present

## 2019-12-05 DIAGNOSIS — A4189 Other specified sepsis: Secondary | ICD-10-CM | POA: Diagnosis not present

## 2019-12-05 DIAGNOSIS — I219 Acute myocardial infarction, unspecified: Secondary | ICD-10-CM

## 2019-12-05 DIAGNOSIS — Z66 Do not resuscitate: Secondary | ICD-10-CM | POA: Diagnosis not present

## 2019-12-05 DIAGNOSIS — J9601 Acute respiratory failure with hypoxia: Secondary | ICD-10-CM | POA: Diagnosis not present

## 2019-12-05 DIAGNOSIS — L8990 Pressure ulcer of unspecified site, unspecified stage: Secondary | ICD-10-CM

## 2019-12-05 DIAGNOSIS — U071 COVID-19: Secondary | ICD-10-CM | POA: Diagnosis not present

## 2019-12-05 LAB — COMPREHENSIVE METABOLIC PANEL
ALT: 511 U/L — ABNORMAL HIGH (ref 0–44)
AST: 394 U/L — ABNORMAL HIGH (ref 15–41)
Albumin: 2.8 g/dL — ABNORMAL LOW (ref 3.5–5.0)
Alkaline Phosphatase: 147 U/L — ABNORMAL HIGH (ref 38–126)
Anion gap: 16 — ABNORMAL HIGH (ref 5–15)
BUN: 91 mg/dL — ABNORMAL HIGH (ref 8–23)
CO2: 17 mmol/L — ABNORMAL LOW (ref 22–32)
Calcium: 8.4 mg/dL — ABNORMAL LOW (ref 8.9–10.3)
Chloride: 118 mmol/L — ABNORMAL HIGH (ref 98–111)
Creatinine, Ser: 2.39 mg/dL — ABNORMAL HIGH (ref 0.61–1.24)
GFR calc Af Amer: 27 mL/min — ABNORMAL LOW (ref >60)
GFR calc non Af Amer: 23 mL/min — ABNORMAL LOW (ref >60)
Glucose, Bld: 217 mg/dL — ABNORMAL HIGH (ref 70–99)
Potassium: 4.3 mmol/L (ref 3.5–5.1)
Sodium: 151 mmol/L — ABNORMAL HIGH (ref 135–145)
Total Bilirubin: 2.5 mg/dL — ABNORMAL HIGH (ref 0.3–1.2)
Total Protein: 6.4 g/dL — ABNORMAL LOW (ref 6.5–8.1)

## 2019-12-05 LAB — PHOSPHORUS: Phosphorus: 4.2 mg/dL (ref 2.5–4.6)

## 2019-12-05 LAB — CBC WITH DIFFERENTIAL/PLATELET
Abs Immature Granulocytes: 0.5 10*3/uL — ABNORMAL HIGH (ref 0.00–0.07)
Basophils Absolute: 0.1 10*3/uL (ref 0.0–0.1)
Basophils Relative: 0 %
Eosinophils Absolute: 0 10*3/uL (ref 0.0–0.5)
Eosinophils Relative: 0 %
HCT: 39.3 % (ref 39.0–52.0)
Hemoglobin: 12.5 g/dL — ABNORMAL LOW (ref 13.0–17.0)
Immature Granulocytes: 2 %
Lymphocytes Relative: 2 %
Lymphs Abs: 0.4 10*3/uL — ABNORMAL LOW (ref 0.7–4.0)
MCH: 31.4 pg (ref 26.0–34.0)
MCHC: 31.8 g/dL (ref 30.0–36.0)
MCV: 98.7 fL (ref 80.0–100.0)
Monocytes Absolute: 0.9 10*3/uL (ref 0.1–1.0)
Monocytes Relative: 4 %
Neutro Abs: 20.4 10*3/uL — ABNORMAL HIGH (ref 1.7–7.7)
Neutrophils Relative %: 92 %
Platelets: 183 10*3/uL (ref 150–400)
RBC: 3.98 MIL/uL — ABNORMAL LOW (ref 4.22–5.81)
RDW: 13.8 % (ref 11.5–15.5)
WBC: 22.3 10*3/uL — ABNORMAL HIGH (ref 4.0–10.5)
nRBC: 1.5 % — ABNORMAL HIGH (ref 0.0–0.2)

## 2019-12-05 LAB — TROPONIN I (HIGH SENSITIVITY): Troponin I (High Sensitivity): >27000 ng/L (ref <18)

## 2019-12-05 LAB — FERRITIN: Ferritin: 415 ng/mL — ABNORMAL HIGH (ref 24–336)

## 2019-12-05 LAB — MAGNESIUM: Magnesium: 2.9 mg/dL — ABNORMAL HIGH (ref 1.7–2.4)

## 2019-12-05 LAB — ECHOCARDIOGRAM LIMITED
S' Lateral: 2.9 cm
Weight: 2243.4 oz

## 2019-12-05 LAB — C-REACTIVE PROTEIN: CRP: 8.7 mg/dL — ABNORMAL HIGH (ref <1.0)

## 2019-12-05 LAB — D-DIMER, QUANTITATIVE: D-Dimer, Quant: >20 ug/mL-FEU — ABNORMAL HIGH (ref 0.00–0.50)

## 2019-12-05 MED ORDER — GLYCOPYRROLATE 0.2 MG/ML IJ SOLN: 0.2000 mg | INTRAMUSCULAR | Status: DC | PRN

## 2019-12-05 MED ORDER — POLYVINYL ALCOHOL 1.4 % OP SOLN
1.0000 [drp] | Freq: Four times a day (QID) | OPHTHALMIC | Status: DC | PRN
  Administered 2019-12-06 – 2019-12-07 (×2): 1 [drp] via OPHTHALMIC
  Filled 2019-12-05: qty 15

## 2019-12-05 MED ORDER — HALOPERIDOL 0.5 MG PO TABS
0.5000 mg | ORAL_TABLET | ORAL | Status: DC | PRN
  Filled 2019-12-05: qty 1

## 2019-12-05 MED ORDER — METOPROLOL TARTRATE 5 MG/5ML IV SOLN
2.5000 mg | Freq: Four times a day (QID) | INTRAVENOUS | Status: DC
  Administered 2019-12-05: 2.5 mg via INTRAVENOUS
  Filled 2019-12-05 (×2): qty 5

## 2019-12-05 MED ORDER — ORAL CARE MOUTH RINSE
15.0000 mL | Freq: Two times a day (BID) | OROMUCOSAL | Status: DC
  Administered 2019-12-06 – 2019-12-07 (×3): 15 mL via OROMUCOSAL

## 2019-12-05 MED ORDER — GLYCOPYRROLATE 1 MG PO TABS
1.0000 mg | ORAL_TABLET | ORAL | Status: DC | PRN
  Filled 2019-12-05: qty 1

## 2019-12-05 MED ORDER — ASPIRIN 300 MG RE SUPP
300.0000 mg | Freq: Every day | RECTAL | Status: DC
  Administered 2019-12-05: 300 mg via RECTAL
  Filled 2019-12-05: qty 1

## 2019-12-05 MED ORDER — ENOXAPARIN SODIUM 80 MG/0.8ML ~~LOC~~ SOLN: 65.0000 mg | Freq: Two times a day (BID) | SUBCUTANEOUS | Status: DC

## 2019-12-05 MED ORDER — CLOPIDOGREL BISULFATE 75 MG PO TABS: 75.0000 mg | ORAL_TABLET | Freq: Every day | ORAL | Status: DC

## 2019-12-05 MED ORDER — HEPARIN (PORCINE) 25000 UT/250ML-% IV SOLN
1000.0000 [IU]/h | INTRAVENOUS | Status: DC
  Administered 2019-12-05: 1000 [IU]/h via INTRAVENOUS
  Filled 2019-12-05: qty 250

## 2019-12-05 MED ORDER — METOPROLOL TARTRATE 5 MG/5ML IV SOLN
5.0000 mg | Freq: Once | INTRAVENOUS | Status: AC
  Administered 2019-12-05: 5 mg via INTRAVENOUS
  Filled 2019-12-05: qty 5

## 2019-12-05 MED ORDER — ENOXAPARIN SODIUM 30 MG/0.3ML ~~LOC~~ SOLN
30.0000 mg | Freq: Once | SUBCUTANEOUS | Status: AC
  Administered 2019-12-05: 30 mg via SUBCUTANEOUS
  Filled 2019-12-05: qty 0.3

## 2019-12-05 MED ORDER — MORPHINE 100MG IN NS 100ML (1MG/ML) PREMIX INFUSION
2.0000 mg/h | INTRAVENOUS | Status: DC
  Administered 2019-12-05: 1 mg/h via INTRAVENOUS
  Administered 2019-12-07: 2 mg/h via INTRAVENOUS
  Filled 2019-12-05 (×2): qty 100

## 2019-12-05 MED ORDER — HALOPERIDOL LACTATE 5 MG/ML IJ SOLN: 0.5000 mg | INTRAMUSCULAR | Status: DC | PRN

## 2019-12-05 MED ORDER — MORPHINE SULFATE (PF) 2 MG/ML IV SOLN
1.0000 mg | INTRAVENOUS | Status: DC
  Administered 2019-12-05: 1 mg via INTRAVENOUS
  Filled 2019-12-05: qty 1

## 2019-12-05 MED ORDER — MORPHINE SULFATE (PF) 2 MG/ML IV SOLN
2.0000 mg | Freq: Once | INTRAVENOUS | Status: AC
  Administered 2019-12-05: 2 mg via INTRAVENOUS
  Filled 2019-12-05: qty 1

## 2019-12-05 MED ORDER — ONDANSETRON HCL 4 MG/2ML IJ SOLN: 4.0000 mg | Freq: Four times a day (QID) | INTRAMUSCULAR | Status: DC | PRN

## 2019-12-05 MED ORDER — ASPIRIN 300 MG RE SUPP: 300.0000 mg | Freq: Every day | RECTAL | Status: DC

## 2019-12-05 MED ORDER — ASPIRIN 325 MG PO TABS: 325.0000 mg | ORAL_TABLET | Freq: Every day | ORAL | Status: DC

## 2019-12-05 MED ORDER — SCOPOLAMINE 1 MG/3DAYS TD PT72
1.0000 | MEDICATED_PATCH | TRANSDERMAL | Status: DC
  Administered 2019-12-05: 1.5 mg via TRANSDERMAL
  Filled 2019-12-05: qty 1

## 2019-12-05 MED ORDER — CLOPIDOGREL BISULFATE 75 MG PO TABS: 300.0000 mg | ORAL_TABLET | Freq: Once | ORAL | Status: DC

## 2019-12-05 MED ORDER — ACETAMINOPHEN 325 MG PO TABS: 650.0000 mg | ORAL_TABLET | Freq: Four times a day (QID) | ORAL | Status: DC | PRN

## 2019-12-05 MED ORDER — LORAZEPAM 2 MG/ML IJ SOLN: 1.0000 mg | INTRAMUSCULAR | Status: DC | PRN

## 2019-12-05 MED ORDER — ACETAMINOPHEN 650 MG RE SUPP: 650.0000 mg | Freq: Four times a day (QID) | RECTAL | Status: DC | PRN

## 2019-12-05 MED ORDER — ONDANSETRON 4 MG PO TBDP: 4.0000 mg | ORAL_TABLET | Freq: Four times a day (QID) | ORAL | Status: DC | PRN

## 2019-12-05 MED ORDER — BIOTENE DRY MOUTH MT LIQD: 15.0000 mL | OROMUCOSAL | Status: DC | PRN

## 2019-12-05 MED ORDER — PERFLUTREN LIPID MICROSPHERE
1.0000 mL | INTRAVENOUS | Status: DC | PRN
  Administered 2019-12-05: 4 mL via INTRAVENOUS
  Filled 2019-12-05: qty 10

## 2019-12-05 MED ORDER — NITROGLYCERIN 0.4 MG SL SUBL
SUBLINGUAL_TABLET | SUBLINGUAL | Status: AC
  Filled 2019-12-05: qty 1

## 2019-12-05 MED ORDER — ATORVASTATIN CALCIUM 40 MG PO TABS: 40.0000 mg | ORAL_TABLET | Freq: Every day | ORAL | Status: DC

## 2019-12-05 MED ORDER — HALOPERIDOL LACTATE 2 MG/ML PO CONC
0.5000 mg | ORAL | Status: DC | PRN
  Filled 2019-12-05: qty 0.3

## 2019-12-05 NOTE — Progress Notes (Signed)
CRITICAL VALUE ALERT  Critical Value:  Troponin 71959  Date & Time Notied:  12/05/2019 at 0713  Provider Notified: MD Morrie Sheldon  Orders Received/Actions taken: heparin drip per pharmacy consult.

## 2019-12-05 NOTE — Progress Notes (Signed)
Patient's vital signs turned to red for pulse and respiration. Discussed patient's condition with charge nurse and notified on call MD. Given iv medicine as per MD ordered. Will continue monitor.

## 2019-12-05 NOTE — Progress Notes (Signed)
  Echocardiogram 2D Echocardiogram has been performed with Definity.  Gerda Diss 12/05/2019, 9:16 AM

## 2019-12-05 NOTE — Progress Notes (Addendum)
IMTS received page at 0455 about a volatile heart rates increasing to 200s then dropping to the 100s. Drs. Sande Brothers and Laural Benes came to the patients room. Patient is nonverbal at baseline, but quite active in bed, which is his baseline per his RN.   Physical Exam Constitutional:      General: He is in acute distress.     Appearance: He is diaphoretic.  Cardiovascular:     Rate and Rhythm: Tachycardia present. Rhythm irregular.     Heart sounds: No murmur heard.  No friction rub. No gallop.   Pulmonary:     Comments: On Nonrebreather, tachypneic  Musculoskeletal:     Right lower leg: No edema.     Left lower leg: No edema.  Skin:    General: Skin is warm.    Patient appears visibly uncomfortable, moving all 4 extremities spontaneously. Given that he had a CT which was positive for PE and started on therapeutic lovenox, it is likely he is now having cardiac involvement.  - EKG - Stat Troponin - Consult to [facility] team and cardiology, we appreciate their recommendations.  - On O2 - ASA 325 mg  - Lovenox  - Echo Ordered  - Holding BB due to unknown EF - Mr. Habermann daughter was notified of his current status.   AddendumSherron Monday with Cardiology about Mr. Hinton inferior STEMI, it was recommended that we treat conservative management at this time.   Dolan Amen, MD IMTS, PGY-2 12/05/2019,6:24 AM

## 2019-12-05 NOTE — Progress Notes (Signed)
Patient's heart rate increased at 155. Notified on call MD and did 12 leads EKG. MD came to patient's bed side. Given nitroglycerin SL as per MD ordered. Will continue monitor.

## 2019-12-05 NOTE — Progress Notes (Signed)
   Subjective: Pt not alert or oriented at this time.   Objective:  Vital signs in last 24 hours: Vitals:   12/05/19 0408 12/05/19 0455 12/05/19 0600 12/05/19 0700  BP: (!) 144/91 138/75 131/84 119/87  Pulse: (!) 113 (!) 144 (!) 124 (!) 146  Resp: 20 (!) 24 (!) 28 (!) 23  Temp: 97.6 F (36.4 C) 97.6 F (36.4 C) 97.9 F (36.6 C) 98 F (36.7 C)  TempSrc: Axillary Axillary Axillary Axillary  SpO2: 98% 92% 91% 100%  Weight:        Supplemental Oxygen: 92% on 15L HFNC  Constitution: appears in acute distress, grunting on exam Cardio: Irregular rhythm, no LE edema  Respiratory: Mild bilateral crackles and wheezing Abdominal: NTTP, soft, non-distended MSK: moving all extremities Neuro: not alert or oriented Skin: c/d/i   Assessment/Plan:  Principal Problem:   Sepsis (HCC) Active Problems:   COVID-19 virus infection   AKI (acute kidney injury) (HCC)   Transaminitis   History of stroke with current residual effects   Pressure injury of skin  86 year old male with known past medical history of stroke, hypertension mild dementia/paranoid schizophrenia presenting with altered mental status and hypoxia, found to be positive for COVID-19 on 9/15.  COVID-19 Pulmonary Embolism  STEMI Hospital day 3.  Patient's condition severely declined overnight.  CTA confirmed pulmonary embolism yesterday and was started on therapeutic Lovenox.  Overnight patient's condition significantly worsened, found to have an inferior STEMI, treated with conservative management at this time.  Cardiology recommended initiating heparin, dual antiplatelet therapy, statins and beta-blocker.  Patient is unresponsive to commands and unable to take p.o. medications; cardiology recommended continuing IV heparin and aspirin suppository.  Recommended starting dual antiplatelet therapy and statin if patient is able to tolerate taking oral medications.  Echocardiogram pending.  Discussed severity of patient's condition  with patient's daughter who states at this time family would like to proceed with comfort measures. She expressed understanding of discontinuing all medications except those that will keep patient comfortable.  -Proceed with comfort measures  -Continue IV morphine 1 mg every hour -Discontinue steroids and IV antibiotics -Continue supplemental oxygen    VTE: None IVF: None Diet: N.p.o. Code: DNR  Dispo: Patient on comfort care measures.  Albie Arizpe N, DO 12/05/2019, 7:26 AM Pager: [phone] After 5pm on weekdays and 1pm on weekends: On Call Pager: [phone]

## 2019-12-05 NOTE — Progress Notes (Addendum)
ANTICOAGULATION CONSULT NOTE - Initial Consult  Pharmacy Consult for lovenox Indication: DVT  No Known Allergies  Patient Measurements: Weight: 63.6 kg (140 lb 3.4 oz) Heparin Dosing Weight: 63.6 kg  Vital Signs: Temp: 98.2 F (36.8 C) (09/19 0000) Temp Source: Axillary (09/19 0000) BP: 150/92 (09/19 0000) Pulse Rate: 110 (09/19 0000)  Labs: Recent Labs    12/01/2019 1223 11/28/2019 1223 12/03/19 0753 12/04/19 0302  HGB 12.1*   < > 12.2* 12.7*  HCT 36.9*  --  37.9* 40.0  PLT 331  --  324 333  CREATININE 3.21*  --  2.41* 1.89*   < > = values in this interval not displayed.    CrCl cannot be calculated (Unknown ideal weight.).   Medical History: Past Medical History:  Diagnosis Date  . Hypertension   . Mild dementia (HCC)     Medications:  See medication history  Assessment: 86 yo lady COVID positive to start full dose lovenox for r/o PE.  Hg 12.7, PTLC 333  She received lovenox 40 mg sq at 20:41 Goal of Therapy:  Anti-Xa level 0.6-1 units/ml 4hrs after LMWH dose given Monitor platelets by anticoagulation protocol: Yes   Plan:  Lovenox 30 mg sq now then 65 mg sq q12 hours F/u renal function, CBC  Denzell Colasanti Poteet 12/05/2019,12:41 AM   Addum:  Change to heparin IV.  Start heparin drip at 1000 units/hr.  Check heparin level 6-8 hours after start.

## 2019-12-05 NOTE — Plan of Care (Signed)
  Problem: Education: Goal: Knowledge of risk factors and measures for prevention of condition will improve Outcome: Not Progressing   Problem: Education: Goal: Knowledge of General Education information will improve Description: Including pain rating scale, medication(s)/side effects and non-pharmacologic comfort measures Outcome: Not Progressing

## 2019-12-05 NOTE — Progress Notes (Signed)
     Notified by Pharmacy that DAPT had been ordered on this patient per the Cardiology fellow overnight for conservative treatment of STEMI as outlined in the prior progress note but he is not currently taking PO medications. Reviewed with Dr. Wyline Mood and would continue with IV Heparin and ASA suppository. Received IV Lopressor 5 mg x1. Will add IV Lopressor 2.5mg  Q6H (would hold for SBP < 100 or HR < 60) and recommend switching to PO Lopressor once able to take PO's. Add Plavix and statin once taking PO's as well. Echocardiogram pending for assessment of EF and wall motion.   Signed, Ellsworth Lennox, PA-C 12/05/2019, 8:51 AM Pager: [phone]

## 2019-12-05 NOTE — Progress Notes (Signed)
   12/05/19 2053  Assess: MEWS Score  Temp 98 F (36.7 C)  BP 134/80  Pulse Rate 84  Resp 16  Level of Consciousness Responds to Pain  SpO2 96 %  O2 Device Non-rebreather Mask  Patient Activity (if Appropriate) In bed  O2 Flow Rate (L/min) 15 L/min  Assess: MEWS Score  MEWS Temp 0  MEWS Systolic 0  MEWS Pulse 0  MEWS RR 0  MEWS LOC 2  MEWS Score 2  MEWS Score Color Comfort Care Only  Assess: if the MEWS score is Yellow or Red  Were vital signs taken at a resting state? Yes  Focused Assessment No change from prior assessment  Early Detection of Sepsis Score *See Row Information* Low  MEWS guidelines implemented *See Row Information* No, other (Comment) (comfort care)  Document  Progress note created (see row info) Yes

## 2019-12-05 NOTE — Progress Notes (Signed)
Called because patient found to have STEMI. Patient has STE in inferior leads suggesting STEMI. The Attending Dr. Angelena Form was informed who decided that since patient is DNR and has sepsis with lactic acidosis and renal failure underlying COVID pneumonia, would not be a candidate for invasive treatment. Hence I advised to proceed with conservative management of STEMI with heparin, DAPT, statins and BB. Not a candidate for ACEI since egfr is low.  EKG shows afib with inferior STEMI and possible posterior wall involvement.

## 2019-12-06 DIAGNOSIS — R0902 Hypoxemia: Secondary | ICD-10-CM | POA: Diagnosis not present

## 2019-12-06 DIAGNOSIS — U071 COVID-19: Secondary | ICD-10-CM

## 2019-12-06 DIAGNOSIS — I2119 ST elevation (STEMI) myocardial infarction involving other coronary artery of inferior wall: Secondary | ICD-10-CM

## 2019-12-06 DIAGNOSIS — I2699 Other pulmonary embolism without acute cor pulmonale: Secondary | ICD-10-CM

## 2019-12-06 DIAGNOSIS — I6930 Unspecified sequelae of cerebral infarction: Secondary | ICD-10-CM

## 2019-12-06 LAB — FERRITIN: Ferritin: 440 ng/mL — ABNORMAL HIGH (ref 24–336)

## 2019-12-06 NOTE — TOC Initial Note (Signed)
Transition of Care [facility]) - Initial/Assessment Note    Patient Details  Name: Scott Gilbert MRN: [id number] Date of Birth: 27-Jun-1929  Transition of [facility] CM/SW Contact:    Lockie Pares, RN Phone Number: 12/06/2019, 10:16 AM  Clinical Narrative:                 Patient admitted, but will be switching to comfort care today. Hospital death expected.   Expected Discharge Plan: Home w Hospice Care Barriers to Discharge: No Barriers Identified   Patient Goals and CMS Choice        Expected Discharge Plan and Services Expected Discharge Plan: Home w Hospice Care                                              Prior Living Arrangements/Services     Patient language and need for interpreter reviewed:: Yes        Need for Family Participation in Patient Care: Yes (Comment) Care giver support system in place?: Yes (comment)   Criminal Activity/Legal Involvement Pertinent to Current Situation/Hospitalization: No - Comment as needed  Activities of Daily Living      Permission Sought/Granted                  Emotional Assessment       Orientation: : Fluctuating Orientation (Suspected and/or reported Sundowners) (responsive to pain) Alcohol / Substance Use: Not Applicable Psych Involvement: No (comment)  Admission diagnosis:  Hypoxia [R09.02] Elevated LFTs [R79.89] AKI (acute kidney injury) (HCC) [N17.9] Sepsis (HCC) [A41.9] COVID-19 [U07.1] Patient Active Problem List   Diagnosis Date Noted  . STEMI (ST elevation myocardial infarction) (HCC) 12/05/2019  . Pressure injury of skin 12/04/2019  . Sepsis (HCC) 2019/12/29  . COVID-19 virus infection 12/29/2019  . AKI (acute kidney injury) (HCC) 12/29/2019  . Transaminitis 12/29/19  . History of stroke with current residual effects 12-29-19  . Oropharyngeal dysphagia   . Aphasia   . Palliative care by specialist   . Goals of care, counseling/discussion   . Muscular weakness   .  Xerostomia   . Problem related to social environment   . Spiritual distress   . Stroke (cerebrum) (HCC) 04/16/2019   PCP:  [facility] Pharmacy:  No Pharmacies Listed    Social Determinants of Health (SDOH) Interventions    Readmission Risk Interventions No flowsheet data found.

## 2019-12-06 NOTE — Progress Notes (Addendum)
CSW notes transition to comfort care. CSW will remain available for any needs.  Versa Craton LCSW

## 2019-12-06 NOTE — Progress Notes (Signed)
Nutrition Brief Note  Chart reviewed. Pt now transitioning to comfort care.  No further nutrition interventions warranted at this time.  Please re-consult as needed.   Shivam Mestas W, RD, LDN, CDCES Registered Dietitian II Certified Diabetes Care and Education Specialist Please refer to AMION for RD and/or RD on-call/weekend/after hours pager  

## 2019-12-06 NOTE — Progress Notes (Signed)
   Subjective:  Opens eyes to voice but does not localize or follow commands.   Objective:  Vital signs in last 24 hours: Vitals:   12/05/19 0600 12/05/19 0700 12/05/19 0751 12/05/19 2053  BP: 131/84 119/87 114/84 134/80  Pulse: (!) 124 (!) 146 88 84  Resp: (!) 28 (!) 23 16 16   Temp: 97.9 F (36.6 C) 98 F (36.7 C) 97.6 F (36.4 C) 98 F (36.7 C)  TempSrc: Axillary Axillary Axillary Axillary  SpO2: 91% 100%  96%  Weight:        Supplemental Oxygen: 92% on 15L HFNC  Constitution: mild distress with tachypnea, non-verbal Cardio: Irregular rhythm, no LE edema  Neuro: not alert or oriented, not following commands, moves to voice but does not localize  Assessment/Plan:  Principal Problem:   Sepsis (HCC) Active Problems:   COVID-19 virus infection   AKI (acute kidney injury) (HCC)   Transaminitis   History of stroke with current residual effects   Pressure injury of skin   STEMI (ST elevation myocardial infarction) (HCC)  86 year old male with known past medical history of stroke, hypertension mild dementia/paranoid schizophrenia presenting with altered mental status and hypoxia, found to be positive for COVID-19 on 9/15.   Comfort Care s/p covid-19 pneumonia, PE, and STEMI Patient transitioned to comfort care yesterday after developing STEMI and PE. Due to patient's poor baseline status and continued decline since stroke and covid, unable to take po medications or respond, the family transitioned the patient to comfort care. Expect in hospital death.   -Proceed with comfort measures  -patient uncomfortable appearing on exam, morphine infusion increased to 2mg /hr -Continue supplemental oxygen - will consider discontinuing this as he is on non-rebreather with heavy oxygen flow  - cont. Prn robinol, haldol, lorazepam - will update family   VTE: None IVF: None Diet: N.p.o. Code: DNR  Dispo: Patient on comfort care measures.  Jkayla Spiewak A, DO 12/06/2019, 8:07  AM Pager: [phone] After 5pm on weekdays and 1pm on weekends: On Call Pager: [phone]

## 2019-12-07 DIAGNOSIS — I2699 Other pulmonary embolism without acute cor pulmonale: Secondary | ICD-10-CM | POA: Diagnosis not present

## 2019-12-07 DIAGNOSIS — I2119 ST elevation (STEMI) myocardial infarction involving other coronary artery of inferior wall: Secondary | ICD-10-CM | POA: Diagnosis not present

## 2019-12-07 DIAGNOSIS — U071 COVID-19: Secondary | ICD-10-CM | POA: Diagnosis not present

## 2019-12-07 LAB — CULTURE, BLOOD (ROUTINE X 2)
Culture: NO GROWTH
Culture: NO GROWTH
Special Requests: ADEQUATE

## 2019-12-07 MED ORDER — POLYVINYL ALCOHOL 1.4 % OP SOLN
2.0000 [drp] | Freq: Four times a day (QID) | OPHTHALMIC | Status: DC
  Administered 2019-12-07: 2 [drp] via OPHTHALMIC
  Filled 2019-12-07: qty 15

## 2019-12-07 MED ORDER — MORPHINE SULFATE (PF) 2 MG/ML IV SOLN
1.0000 mg | Freq: Once | INTRAVENOUS | Status: AC
  Administered 2019-12-07: 1 mg via INTRAVENOUS
  Filled 2019-12-07: qty 1

## 2019-12-17 NOTE — Progress Notes (Addendum)
   Subjective:  Opens eyes to voice but does not localize or follow commands.   Objective:  Vital signs in last 24 hours: Vitals:   12/05/19 0751 12/05/19 2053 12/06/19 0900 01/06/20 0404  BP: 114/84 134/80 (!) 139/94 (!) 130/55  Pulse: 88 84 (!) 107 (!) 115  Resp: 16 16 16    Temp: 97.6 F (36.4 C) 98 F (36.7 C) 97.9 F (36.6 C) 98.8 F (37.1 C)  TempSrc: Axillary Axillary  Oral  SpO2:  96% 93% (!) 78%  Weight:        Constitution: mild distress with tachypnea, non-verbal Cardio: Irregular rhythm, +1 edema Neuro: not alert or oriented, not following commands, moves to voice but does not localize  Assessment/Plan:  Principal Problem:   Sepsis (HCC) Active Problems:   COVID-19 virus infection   AKI (acute kidney injury) (HCC)   Transaminitis   History of stroke with current residual effects   Pressure injury of skin   STEMI (ST elevation myocardial infarction) (HCC)   Hypoxia   Acute pulmonary embolism (HCC)  86 year old male with known past medical history of stroke, hypertension mild dementia/paranoid schizophrenia presenting with altered mental status and hypoxia, found to be positive for COVID-19 on 9/15. Patient transitioned to comfort care 9/19 after developing STEMI and PE. Due to patient's poor baseline status and continued decline since stroke and covid, unable to take po medications or respond, the family transitioned the patient to comfort care.   Comfort Care s/p covid-19 pneumonia, PE, and STEMI Patient with lower saturations this morning, expect in hospital death. Appearing more comfortable than yesterday but still with labored breathing.   -Cont comfort measures  -one time dose 1 mg morphine, cont. 2mg /hr infusion  -Continue supplemental oxygen - will consider discontinuing this as he is on non-rebreather with heavy oxygen flow  - cont. Prn robinol, haldol, lorazepam - will update family   VTE: None IVF: None Diet: N.p.o. Code: DNR  Dispo:  Patient on comfort care measures.  Mithran Strike A, DO 01-06-2020, 6:53 AM Pager: [phone] After 5pm on weekdays and 1pm on weekends: On Call Pager: [phone]

## 2019-12-17 NOTE — Progress Notes (Signed)
Shortly after giving pt Morphine 1mg  IVP, pt began agonal breathing around 1210. Unable to auscultate or palpate a pulse. Emma NT tried to collect a set of vitals but vitals didn't register. Confirmed with . Pt's time of death 1210/07/14. Notified Dr. 1213 at 07-14-1214. Spoke with 1217 at [facility] and notified pt does not qualify for a donor.

## 2019-12-17 NOTE — Death Summary Note (Addendum)
   Name: Scott Gilbert MRN: [id number] DOB: 1929-04-28 86 y.o. PCP: [facility]  Date of Admission: 12/01/2019 11:27 AM Date of Discharge: September 24, 202109/24/2021 Attending Physician: Lucretia Kern, MD  Discharge Diagnosis: 1. Covid-19 Pneumonia 2. STEMI 3. Pulmonary Embolism  Hospital Summary:    Scott Gilbert passed away at [facility] on this date.   Hospital Course by problem list: 1. Covid-19 Pneumonia 2. STEMI 3. Pulmonary Embolism 4. Transaminitis 5. Acute Renal Failure 6. Sepsis secondary to bacterial pneumonia  Scott Gilbert is an 86yo male with PMH of dementia, recent CVA causing chronic debility, GERD, hypertension who presented from his nursing facility with altered mental status, found to be hypoxic and with increased encephalopathy due to covid-19 pneumonia and sepsis with likely bacterial pneumonia. He additionally was found to have AKI, transaminitis. He was started on decadron and IV antibiotics for likely bacterial pneumonia. The following day CTA was obtained due to elevated d-dimer and patient was found to have PE. He also developed HR to the 200s and increased agitation and was found to have STEMI. Cardiology was consulted and risks and benefits of PCI and medical treatment treatment were discussed with family. Ultimately it was decided to transition the patient to comfort care with his many comorbidities, organ failure, and poor prognosis and quality of life.    Pertinent Labs, Studies, and Procedures:   CBC Latest Ref Rng & Units 12/05/2019 12/04/2019 12/03/2019  WBC 4.0 - 10.5 K/uL 22.3(H) 20.8(H) 16.3(H)  Hemoglobin 13.0 - 17.0 g/dL 12.5(L) 12.7(L) 12.2(L)  Hematocrit 39 - 52 % 39.3 40.0 37.9(L)  Platelets 150 - 400 K/uL 183 333 324   BMP Latest Ref Rng & Units 12/05/2019 12/04/2019 12/03/2019  Glucose 70 - 99 mg/dL 128(N) 867(E) 720(N)  BUN 8 - 23 mg/dL 47(S) 96(G) 83(M)  Creatinine 0.61 - 1.24 mg/dL 6.29(U) 7.65(Y) 6.50(P)   Sodium 135 - 145 mmol/L 151(H) 149(H) 146(H)  Potassium 3.5 - 5.1 mmol/L 4.3 4.0 4.0  Chloride 98 - 111 mmol/L 118(H) 115(H) 113(H)  CO2 22 - 32 mmol/L 17(L) 20(L) 20(L)  Calcium 8.9 - 10.3 mg/dL 5.4(S) 5.6(C) 8.3(L)   12/04/2019 CTA IMPRESSION: Acute pulmonary embolism. The embolic burden is small. No CT evidence of right heart strain.   Extensive pulmonary infiltrates, likely infectious or inflammatory in nature. Superimposed small bilateral pleural effusions.   Extensive coronary artery calcification.   Porcelain gallbladder   These results will be called to the ordering clinician or representative by the Radiologist Assistant, and communication documented in the PACS or [employer].   Aortic Atherosclerosis (ICD10-I70.0). Electronically Signed   By: Helyn Numbers MD   On: 12/04/2019 23:31  US Abdomen Complete 9/16 IMPRESSION: Nonvisualized gallbladder which may be due to decompressed state.   Hepatic steatosis     Electronically Signed   By: Jonna Clark M.D.   On: 11/25/2019 16:26  CXR 11/25/2019 FINDINGS: The cardiac silhouette, mediastinal and hilar contours are within normal limits for the patient's age and AP projection and portable technique. There is tortuosity and calcification of the thoracic aorta.   Increased interstitial markings and patchy ill-defined infiltrates consistent with COVID pneumonia. No pleural effusions or pulmonary lesions.   IMPRESSION: Patchy bilateral infiltrates consistent with COVID pneumonia.     Electronically Signed   By: Rudie Meyer M.D.   On: 12/03/2019 12:09    Signed: Versie Starks, DO 12-10-2019, 3:10 PM   Pager: [phone]

## 2019-12-17 DEATH — deceased

## 2021-04-25 IMAGING — MR MR HEAD W/O CM
12 of 13 series · 44 of 48 positions shown · non-contrast
Comparison: Prior CT, CTA, and CT perfusion from 04/16/2019.

CLINICAL DATA: Follow-up examination for acute stroke.

EXAM:
MRI HEAD WITHOUT CONTRAST
TECHNIQUE: Multiplanar, multiecho pulse sequences of the brain and surrounding
structures were obtained without intravenous contrast.

[Series 5: DWI · axial · 3.0mm · 0.88mm/px · z∈[-122,+7]mm · 8 of 90 slices shown (1 of 4)]
[im 1/90]
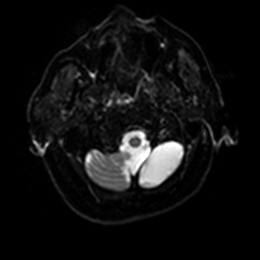
[im 13/90]
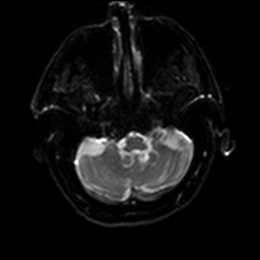
[im 26/90]
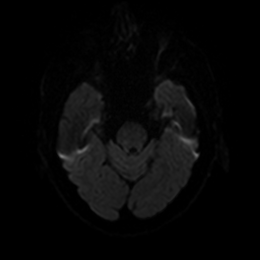
[im 39/90]
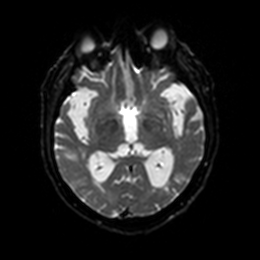
[im 51/90]
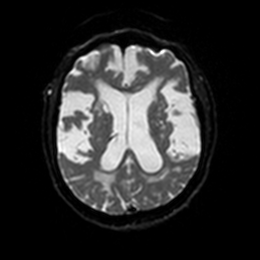
[im 64/90]
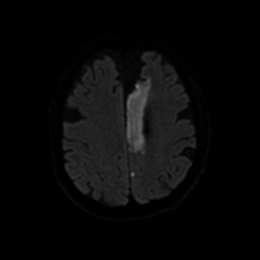
[im 77/90]
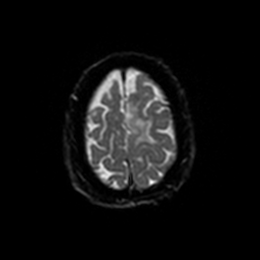
[im 90/90]
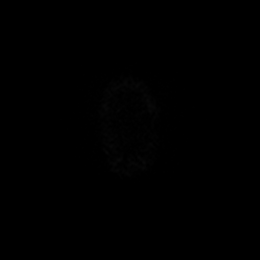

[Series 6: DWI · axial · 3.0mm · 0.88mm/px · z∈[-122,+4]mm · 4 of 44 slices shown (2 of 4)]
[im 1/44]
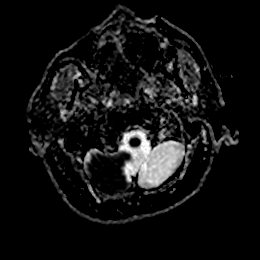
[im 15/44]
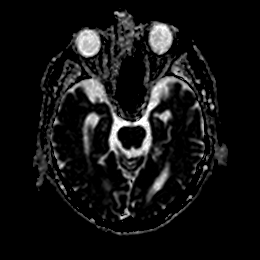
[im 29/44]
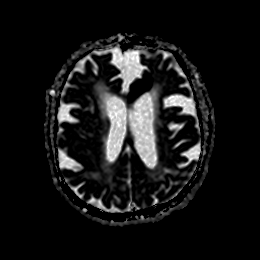
[im 44/44]
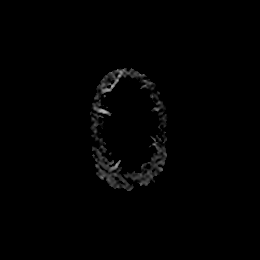

[Series 7: DWI · coronal · 4.0mm · 0.88mm/px · 5 of 66 slices shown (3 of 4)]
[im 1/66]
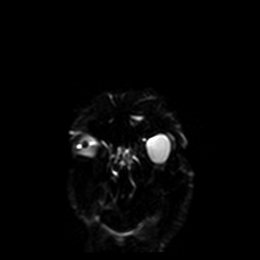
[im 17/66]
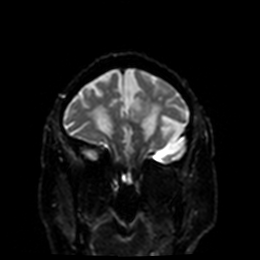
[im 33/66]
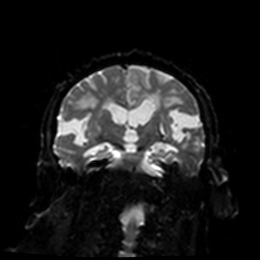
[im 49/66]
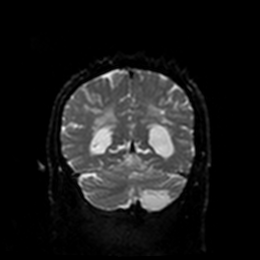
[im 66/66]
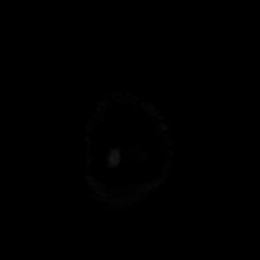

[Series 8: DWI · coronal · 4.0mm · 0.88mm/px · 3 of 33 slices shown (4 of 4)]
[im 1/33]
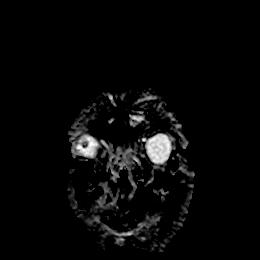
[im 17/33]
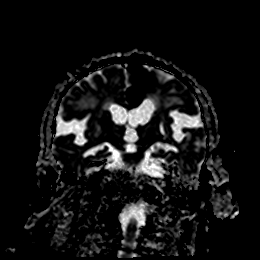
[im 33/33]
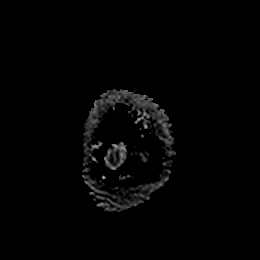

[Series 9: T1 · sagittal · 5.0mm · 0.75mm/px · 2 of 23 slices shown]
[im 1/23]
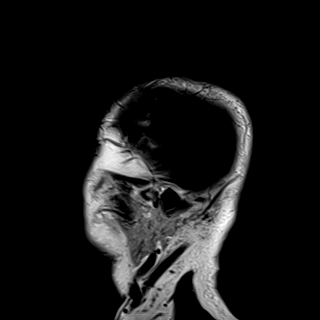
[im 23/23]
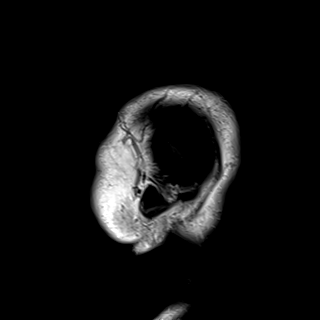

[Series 10: T2 · axial · 5.0mm · 0.72mm/px · z∈[-132,+8]mm · 2 of 25 slices shown (1 of 2)]
[im 1/25]
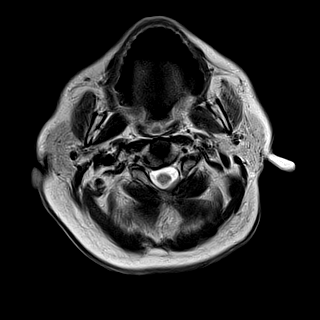
[im 25/25]
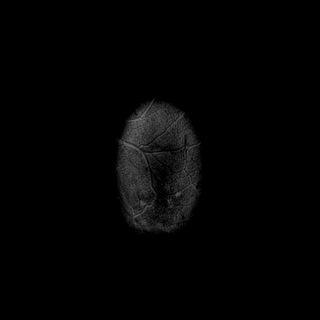

[Series 11: FLAIR · axial · 5.0mm · 0.45mm/px · z∈[-128,+12]mm · 2 of 25 slices shown]
[im 1/25]
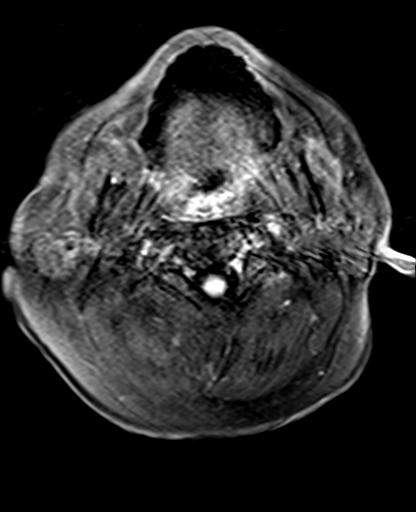
[im 25/25]
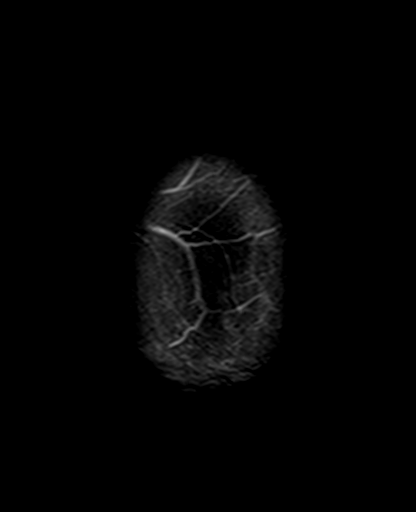

[Series 13: pha_images · axial · 3.0mm · 0.90mm/px · z∈[-115,+28]mm · 4 of 49 slices shown (1 of 2)]
[im 1/49]
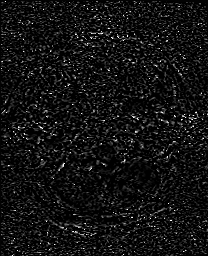
[im 17/49]
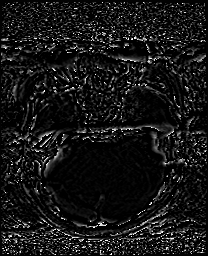
[im 33/49]
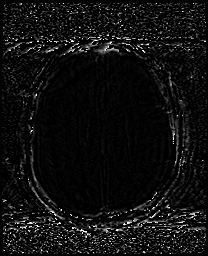
[im 49/49]
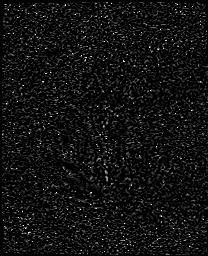

[Series 14: swi_images · axial · 3.0mm · 0.90mm/px · z∈[-115,+34]mm · 4 of 52 slices shown (1 of 2)]
[im 1/52]
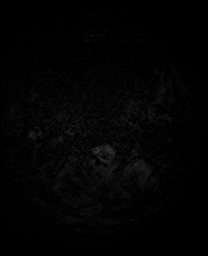
[im 18/52]
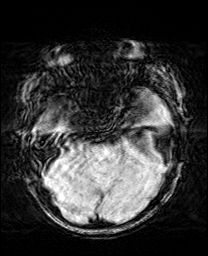
[im 35/52]
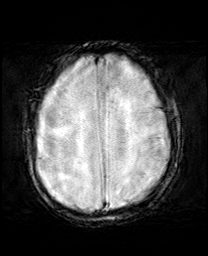
[im 52/52]
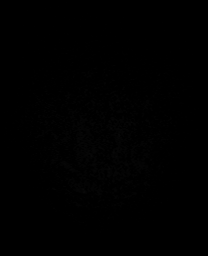

[Series 17: T2 · coronal · 5.0mm · 0.34mm/px · 2 of 29 slices shown (2 of 2)]
[im 1/29]
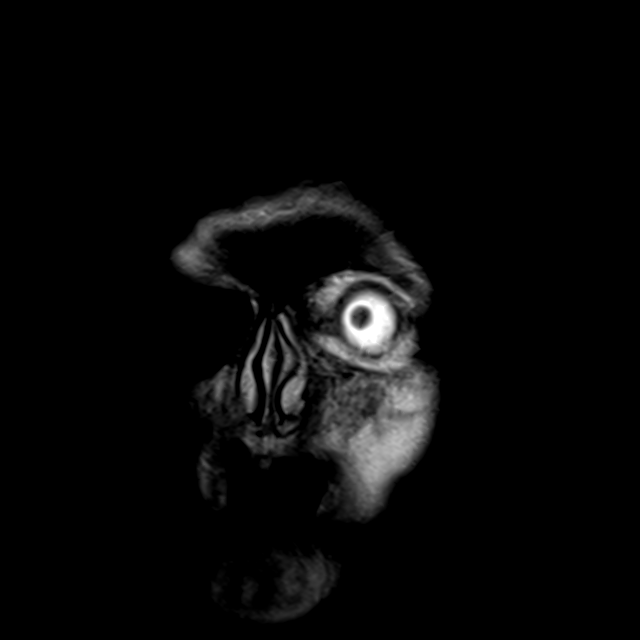
[im 29/29]
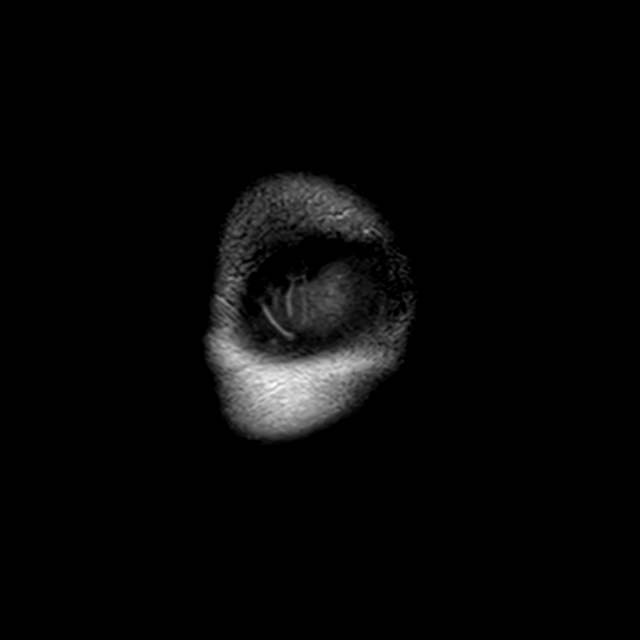

[Series 19: pha_images · axial · 3.0mm · 0.90mm/px · z∈[-120,+21]mm · 4 of 48 slices shown (2 of 2)]
[im 1/48]
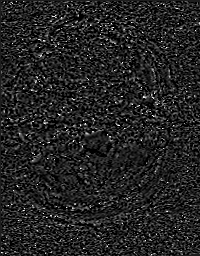
[im 16/48]
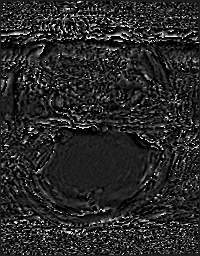
[im 32/48]
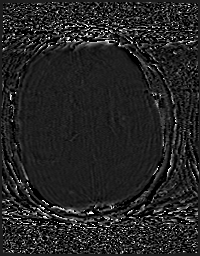
[im 48/48]
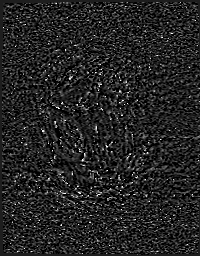

[Series 20: swi_images · axial · 3.0mm · 0.90mm/px · z∈[-120,+30]mm · 4 of 52 slices shown (2 of 2)]
[im 1/52]
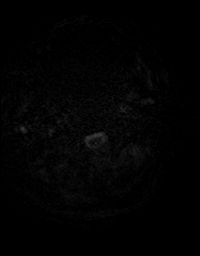
[im 18/52]
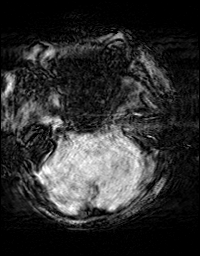
[im 35/52]
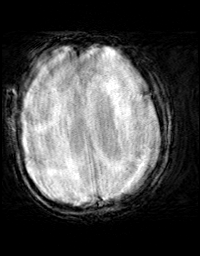
[im 52/52]
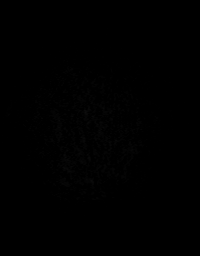

[44 of 48 positions shown; findings below may reference images not displayed]

FINDINGS: Brain: Moderately advanced age-related cerebral atrophy. Patchy
T2/FLAIR hyperintensity within the periventricular deep white matter
both cerebral hemispheres most consistent with chronic small vessel
ischemic disease, moderate to advanced in nature. Multiple scattered
remote lacunar infarcts present within the bilateral basal ganglia.
Associated scattered chronic hemosiderin staining. Multifocal
chronic left cerebellar infarcts.

Moderate size confluent area of restricted diffusion involving the
parasagittal left frontal lobe, consistent with acute left ACA
territory infarct, corresponding with prior perfusion study. No
associated hemorrhage or mass effect.

No other evidence for acute or subacute ischemia. Gray-white matter
differentiation otherwise maintained. No other evidence for acute or
chronic intracranial hemorrhage.

No mass lesion, midline shift or mass effect. Diffuse ventricular
prominence related to global parenchymal volume loss without
hydrocephalus. No extra-axial fluid collection. Pituitary gland
suprasellar region normal. Midline structures intact.

Vascular: Major intracranial vascular flow voids maintained.

Skull and upper cervical spine: Craniocervical junction within
normal limits. Bone marrow signal intensity normal. No scalp soft
tissue abnormality.

Sinuses/Orbits: Globes and orbital soft tissues within normal
limits. Paranasal sinuses are largely clear. Trace fluid signal
intensity noted within the left mastoid air cells, of doubtful
significance. Inner ear structures grossly normal.

Other: None.
IMPRESSION: 1. Moderate-sized acute ischemic nonhemorrhagic left ACA territory
infarct without associated mass effect.
2. Moderate to advanced age-related cerebral atrophy with chronic
small vessel ischemic disease and multiple remote lacunar infarcts
about the bilateral basal ganglia, with additional chronic left
cerebellar infarcts.
